# Patient Record
Sex: Male | Born: 1945 | Race: White | Hispanic: No | Marital: Married | Smoking: Never smoker
Health system: Southern US, Community
[De-identification: ages and names within clinical notes are randomized; demographics above are authoritative.]

## PROBLEM LIST (undated history)

## (undated) DIAGNOSIS — F988 Other specified behavioral and emotional disorders with onset usually occurring in childhood and adolescence: Secondary | ICD-10-CM

## (undated) DIAGNOSIS — E7800 Pure hypercholesterolemia, unspecified: Secondary | ICD-10-CM

## (undated) DIAGNOSIS — Z800 Family history of malignant neoplasm of digestive organs: Secondary | ICD-10-CM

## (undated) DIAGNOSIS — R911 Solitary pulmonary nodule: Secondary | ICD-10-CM

## (undated) DIAGNOSIS — D126 Benign neoplasm of colon, unspecified: Secondary | ICD-10-CM

## (undated) DIAGNOSIS — K5730 Diverticulosis of large intestine without perforation or abscess without bleeding: Secondary | ICD-10-CM

## (undated) DIAGNOSIS — K219 Gastro-esophageal reflux disease without esophagitis: Secondary | ICD-10-CM

## (undated) DIAGNOSIS — Z0189 Encounter for other specified special examinations: Secondary | ICD-10-CM

## (undated) DIAGNOSIS — I10 Essential (primary) hypertension: Principal | ICD-10-CM

## (undated) DIAGNOSIS — R7303 Prediabetes: Secondary | ICD-10-CM

## (undated) DIAGNOSIS — E785 Hyperlipidemia, unspecified: Secondary | ICD-10-CM

## (undated) DIAGNOSIS — H9193 Unspecified hearing loss, bilateral: Secondary | ICD-10-CM

## (undated) DIAGNOSIS — R413 Other amnesia: Secondary | ICD-10-CM

## (undated) DIAGNOSIS — H269 Unspecified cataract: Secondary | ICD-10-CM

## (undated) DIAGNOSIS — R109 Unspecified abdominal pain: Secondary | ICD-10-CM

## (undated) DIAGNOSIS — E669 Obesity, unspecified: Secondary | ICD-10-CM

## (undated) DIAGNOSIS — R0789 Other chest pain: Secondary | ICD-10-CM

## (undated) HISTORY — DX: Unspecified hearing loss, bilateral: H91.93

## (undated) HISTORY — DX: Obesity, unspecified: E66.9

## (undated) HISTORY — DX: Other amnesia: R41.3

## (undated) HISTORY — DX: Other specified behavioral and emotional disorders with onset usually occurring in childhood and adolescence: F98.8

## (undated) HISTORY — PX: VASECTOMY: SHX75

## (undated) HISTORY — DX: Prediabetes: R73.03

## (undated) HISTORY — DX: Essential (primary) hypertension: I10

## (undated) HISTORY — DX: Encounter for other specified special examinations: Z01.89

## (undated) HISTORY — DX: Other chest pain: R07.89

## (undated) HISTORY — DX: Family history of malignant neoplasm of digestive organs: Z80.0

## (undated) HISTORY — DX: Pure hypercholesterolemia, unspecified: E78.00

## (undated) HISTORY — DX: Solitary pulmonary nodule: R91.1

## (undated) HISTORY — DX: Gastro-esophageal reflux disease without esophagitis: K21.9

## (undated) HISTORY — DX: Benign neoplasm of colon, unspecified: D12.6

## (undated) HISTORY — DX: Unspecified cataract: H26.9

## (undated) HISTORY — DX: Hyperlipidemia, unspecified: E78.5

## (undated) HISTORY — DX: Diverticulosis of large intestine without perforation or abscess without bleeding: K57.30

## (undated) HISTORY — DX: Unspecified abdominal pain: R10.9

## (undated) HISTORY — PX: CATARACT EXTRACTION W/ INTRAOCULAR LENS  IMPLANT, BILATERAL: SHX1307

---

## 1983-09-03 HISTORY — PX: COCCYGECTOMY: SUR274

## 1998-12-19 ENCOUNTER — Ambulatory Visit (HOSPITAL_COMMUNITY): Admission: RE | Admit: 1998-12-19 | Discharge: 1998-12-19 | Payer: Self-pay | Admitting: Family Medicine

## 1998-12-20 ENCOUNTER — Encounter: Admission: RE | Admit: 1998-12-20 | Discharge: 1998-12-20 | Payer: Self-pay | Admitting: Family Medicine

## 1998-12-31 ENCOUNTER — Encounter: Payer: Self-pay | Admitting: Family Medicine

## 1998-12-31 ENCOUNTER — Ambulatory Visit (HOSPITAL_COMMUNITY): Admission: RE | Admit: 1998-12-31 | Discharge: 1998-12-31 | Payer: Self-pay | Admitting: Family Medicine

## 1999-01-08 ENCOUNTER — Encounter: Admission: RE | Admit: 1999-01-08 | Discharge: 1999-01-08 | Payer: Self-pay | Admitting: Family Medicine

## 1999-01-22 ENCOUNTER — Encounter: Admission: RE | Admit: 1999-01-22 | Discharge: 1999-01-22 | Payer: Self-pay | Admitting: Family Medicine

## 1999-06-08 ENCOUNTER — Encounter: Admission: RE | Admit: 1999-06-08 | Discharge: 1999-06-08 | Payer: Self-pay | Admitting: Family Medicine

## 1999-12-31 ENCOUNTER — Encounter: Admission: RE | Admit: 1999-12-31 | Discharge: 1999-12-31 | Payer: Self-pay | Admitting: Family Medicine

## 1999-12-31 ENCOUNTER — Encounter: Payer: Self-pay | Admitting: Sports Medicine

## 2003-01-27 ENCOUNTER — Encounter: Admission: RE | Admit: 2003-01-27 | Discharge: 2003-01-27 | Payer: Self-pay | Admitting: Family Medicine

## 2003-02-17 ENCOUNTER — Encounter: Admission: RE | Admit: 2003-02-17 | Discharge: 2003-02-17 | Payer: Self-pay | Admitting: Family Medicine

## 2003-04-25 ENCOUNTER — Encounter (INDEPENDENT_AMBULATORY_CARE_PROVIDER_SITE_OTHER): Payer: Self-pay | Admitting: *Deleted

## 2003-04-25 ENCOUNTER — Ambulatory Visit (HOSPITAL_COMMUNITY): Admission: RE | Admit: 2003-04-25 | Discharge: 2003-04-25 | Payer: Self-pay | Admitting: Gastroenterology

## 2004-08-30 ENCOUNTER — Ambulatory Visit (HOSPITAL_COMMUNITY): Admission: RE | Admit: 2004-08-30 | Discharge: 2004-08-30 | Payer: Self-pay | Admitting: Family Medicine

## 2004-08-30 ENCOUNTER — Ambulatory Visit: Payer: Self-pay | Admitting: Family Medicine

## 2004-09-06 ENCOUNTER — Ambulatory Visit (HOSPITAL_COMMUNITY): Admission: RE | Admit: 2004-09-06 | Discharge: 2004-09-06 | Payer: Self-pay | Admitting: Family Medicine

## 2004-09-06 ENCOUNTER — Ambulatory Visit: Payer: Self-pay | Admitting: Family Medicine

## 2004-10-01 ENCOUNTER — Ambulatory Visit (HOSPITAL_COMMUNITY): Admission: RE | Admit: 2004-10-01 | Discharge: 2004-10-01 | Payer: Self-pay | Admitting: Family Medicine

## 2005-07-15 ENCOUNTER — Ambulatory Visit: Payer: Self-pay | Admitting: Family Medicine

## 2006-09-02 DIAGNOSIS — Z0189 Encounter for other specified special examinations: Secondary | ICD-10-CM

## 2006-09-02 HISTORY — DX: Encounter for other specified special examinations: Z01.89

## 2006-10-06 ENCOUNTER — Ambulatory Visit (HOSPITAL_COMMUNITY): Admission: RE | Admit: 2006-10-06 | Discharge: 2006-10-06 | Payer: Self-pay | Admitting: Family Medicine

## 2006-10-06 ENCOUNTER — Ambulatory Visit: Payer: Self-pay | Admitting: Family Medicine

## 2006-10-06 LAB — CONVERTED CEMR LAB
ALT: 21 units/L (ref 0–53)
AST: 18 units/L (ref 0–37)
Albumin: 4.7 g/dL (ref 3.5–5.2)
Alkaline Phosphatase: 43 units/L (ref 39–117)
BUN: 23 mg/dL (ref 6–23)
CO2: 27 meq/L (ref 19–32)
Calcium: 10.1 mg/dL (ref 8.4–10.5)
Chloride: 102 meq/L (ref 96–112)
Cholesterol: 216 mg/dL — ABNORMAL HIGH (ref 0–200)
Creatinine, Ser: 1.15 mg/dL (ref 0.40–1.50)
Glucose, Bld: 104 mg/dL — ABNORMAL HIGH (ref 70–99)
HDL: 50 mg/dL (ref >39)
LDL Cholesterol: 134 mg/dL — ABNORMAL HIGH (ref 0–99)
Potassium: 4.8 meq/L (ref 3.5–5.3)
Sodium: 147 meq/L — ABNORMAL HIGH (ref 135–145)
TSH: 1.538 microintl units/mL (ref 0.350–5.50)
Total Bilirubin: 0.6 mg/dL (ref 0.3–1.2)
Total CHOL/HDL Ratio: 4.3
Total Protein: 7.4 g/dL (ref 6.0–8.3)
Triglycerides: 161 mg/dL — ABNORMAL HIGH (ref <150)
VLDL: 32 mg/dL (ref 0–40)

## 2006-10-20 ENCOUNTER — Ambulatory Visit: Payer: Self-pay | Admitting: Internal Medicine

## 2006-10-20 ENCOUNTER — Ambulatory Visit: Payer: Self-pay

## 2006-10-20 ENCOUNTER — Ambulatory Visit: Payer: Self-pay | Admitting: Cardiovascular Disease

## 2006-10-30 DIAGNOSIS — D126 Benign neoplasm of colon, unspecified: Secondary | ICD-10-CM

## 2006-10-30 DIAGNOSIS — E669 Obesity, unspecified: Secondary | ICD-10-CM

## 2006-10-30 DIAGNOSIS — F988 Other specified behavioral and emotional disorders with onset usually occurring in childhood and adolescence: Secondary | ICD-10-CM | POA: Insufficient documentation

## 2006-10-30 DIAGNOSIS — K219 Gastro-esophageal reflux disease without esophagitis: Secondary | ICD-10-CM

## 2006-10-30 DIAGNOSIS — E7800 Pure hypercholesterolemia, unspecified: Secondary | ICD-10-CM | POA: Insufficient documentation

## 2006-10-30 DIAGNOSIS — E66811 Obesity, class 1: Secondary | ICD-10-CM

## 2006-10-30 DIAGNOSIS — E663 Overweight: Secondary | ICD-10-CM

## 2006-10-30 DIAGNOSIS — E785 Hyperlipidemia, unspecified: Secondary | ICD-10-CM

## 2006-10-30 HISTORY — DX: Other specified behavioral and emotional disorders with onset usually occurring in childhood and adolescence: F98.8

## 2006-10-30 HISTORY — DX: Gastro-esophageal reflux disease without esophagitis: K21.9

## 2006-10-30 HISTORY — DX: Obesity, unspecified: E66.9

## 2006-10-30 HISTORY — DX: Benign neoplasm of colon, unspecified: D12.6

## 2006-10-30 HISTORY — DX: Pure hypercholesterolemia, unspecified: E78.00

## 2006-10-30 HISTORY — DX: Obesity, class 1: E66.811

## 2006-10-30 HISTORY — DX: Hyperlipidemia, unspecified: E78.5

## 2006-11-03 ENCOUNTER — Ambulatory Visit: Payer: Self-pay | Admitting: Psychology

## 2006-11-10 ENCOUNTER — Encounter: Payer: Self-pay | Admitting: Family Medicine

## 2007-01-05 ENCOUNTER — Encounter: Payer: Self-pay | Admitting: Family Medicine

## 2007-04-13 ENCOUNTER — Ambulatory Visit: Payer: Self-pay | Admitting: Cardiology

## 2008-05-02 ENCOUNTER — Ambulatory Visit: Payer: Self-pay | Admitting: Internal Medicine

## 2008-06-16 ENCOUNTER — Encounter: Payer: Self-pay | Admitting: Family Medicine

## 2009-04-19 ENCOUNTER — Telehealth: Payer: Self-pay | Admitting: Family Medicine

## 2009-04-24 ENCOUNTER — Ambulatory Visit: Payer: Self-pay | Admitting: Family Medicine

## 2009-04-24 DIAGNOSIS — I10 Essential (primary) hypertension: Secondary | ICD-10-CM | POA: Insufficient documentation

## 2009-04-24 HISTORY — DX: Essential (primary) hypertension: I10

## 2009-04-24 LAB — CONVERTED CEMR LAB
ALT: 19 units/L (ref 0–53)
AST: 18 units/L (ref 0–37)
Albumin: 4.6 g/dL (ref 3.5–5.2)
Alkaline Phosphatase: 48 units/L (ref 39–117)
BUN: 21 mg/dL (ref 6–23)
CO2: 26 meq/L (ref 19–32)
Calcium: 9.7 mg/dL (ref 8.4–10.5)
Chloride: 104 meq/L (ref 96–112)
Cholesterol: 210 mg/dL — ABNORMAL HIGH (ref 0–200)
Creatinine, Ser: 1.08 mg/dL (ref 0.40–1.50)
Glucose, Bld: 91 mg/dL (ref 70–99)
HCT: 44.7 % (ref 39.0–52.0)
HDL: 68 mg/dL (ref >39)
Hemoglobin: 14.6 g/dL (ref 13.0–17.0)
LDL Cholesterol: 103 mg/dL — ABNORMAL HIGH (ref 0–99)
MCHC: 32.7 g/dL (ref 30.0–36.0)
MCV: 83.6 fL (ref 78.0–100.0)
PSA: 2.52 ng/mL (ref 0.10–4.00)
Platelets: 241 10*3/uL (ref 150–400)
Potassium: 4.8 meq/L (ref 3.5–5.3)
RBC: 5.35 M/uL (ref 4.22–5.81)
RDW: 13.6 % (ref 11.5–15.5)
Sodium: 144 meq/L (ref 135–145)
Total Bilirubin: 0.3 mg/dL (ref 0.3–1.2)
Total CHOL/HDL Ratio: 3.1
Total Protein: 7.2 g/dL (ref 6.0–8.3)
Triglycerides: 195 mg/dL — ABNORMAL HIGH (ref <150)
VLDL: 39 mg/dL (ref 0–40)
WBC: 9.3 10*3/uL (ref 4.0–10.5)

## 2009-04-25 ENCOUNTER — Encounter: Payer: Self-pay | Admitting: Family Medicine

## 2009-05-01 ENCOUNTER — Ambulatory Visit: Payer: Self-pay | Admitting: Family Medicine

## 2009-05-15 ENCOUNTER — Ambulatory Visit: Payer: Self-pay | Admitting: Family Medicine

## 2009-08-03 ENCOUNTER — Ambulatory Visit: Payer: Self-pay | Admitting: Family Medicine

## 2010-10-08 ENCOUNTER — Encounter: Payer: Self-pay | Admitting: *Deleted

## 2011-01-18 NOTE — Op Note (Signed)
NAME:  Christopher Wall, Christopher Wall                         ACCOUNT NO.:  [id number]   MEDICAL RECORD NO.:  [id number]                   PATIENT TYPE:  AMB   LOCATION:  ENDO                                 FACILITY:  [facility]   PHYSICIAN:  Anselmo Rod, M.D.               DATE OF BIRTH:  Feb 28, 1946   DATE OF PROCEDURE:  04/25/2003  DATE OF DISCHARGE:                                 OPERATIVE REPORT   PROCEDURE:  Screening colonoscopy (colonoscopy with cold biopsies x 8).   ENDOSCOPIST:  Charna Elizabeth, M.D.   INSTRUMENT USED:  Olympus video colonoscope.   INDICATIONS FOR PROCEDURE:  A 65 year old white male undergoing screening  colonoscopy to rule out colonic polyps, masses, etc.   PREPROCEDURE PREPARATION:  Informed consent was procured from the patient.  The patient fasted for eight hours prior to the procedure and prepped with a  bottle of magnesium citrate and a gallon of GOLYTELY the night prior to the  procedure.   PREPROCEDURE PHYSICAL EXAMINATION:  VITAL SIGNS:  The patient had stable  vital signs.  NECK:  Supple.  CHEST:  Clear to auscultation.  HEART:  S1 and S2 regular.  ABDOMEN:  Soft with normal bowel sounds.   DESCRIPTION OF PROCEDURE:  The patient was placed in the left lateral  decubitus position, and sedated with 50 mg of Demerol and 5 mg of Versed  intravenously.  Once the patient was adequately sedated and maintained on  low flow oxygen and continuous cardiac monitoring, the Olympus video  colonoscope was advanced from the rectum to the cecum and terminal ileum  without difficulty.  There was some residual stool in the colon.  Multiple  washings were done.  The appendical orifice and ileocecal valve were clearly  visualized.  The terminal ileum appeared healthy and without lesions.  Prominent lymphoid follicles were noted.  Eight small sessile polyps were  biopsied from the rectosigmoid area.  These appeared to be hyperplastic in  nature.  Retroflexion in the rectum  revealed no abnormalities.  The patient  tolerated the procedure well without complications.  There was no evidence  of diverticulosis.   IMPRESSION:  1. Multiple small sessile polyps biopsied from the rectosigmoid area.  2. Otherwise, normal colonoscopy to the terminal ileum.  3. Retroflexion in the rectum revealed no abnormalities.           RECOMMENDATIONS:  1. Await pathology results.  2. Repeat CRC screening depending on pathology results.  3. Avoid all nonsteroidals including aspirin for the next 2 weeks.  4. Outpatient followup as advised in the future.                                               Anselmo Rod, M.D.    JNM/MEDQ  D:  04/25/2003  T:  04/25/2003  Job:  [id number]   cc:   Leighton Roach McDiarmid, M.D.  [address] [place]  [place] [address]  Fax: [phone]

## 2011-01-18 NOTE — Procedures (Signed)
[facility]                              EXERCISE TREADMILL   NAME:Christopher Wall, Christopher Wall                      MRN:          [id number]  DATE:10/20/2006                            DOB:          [birth year]    This patient has finished the equivalent of stage IV of the Bruce  protocol exercise up to fatigue.  His maximum heart rate was 164 beats.  Blood pressure 198/96.  Resting EKG showed normal sinus rhythm.  Essentially normal except for some nonspecific T waves in lead III with  exercise.  There was no significant ischemia.  No chest pain.  No  arrhythmia.   IMPRESSION:  Negative exercise stress test at a good workload.     Noralyn Pick. Eden Emms, MD, [facility]  Electronically Signed    PCN/MedQ  DD: 10/20/2006  DT: 10/20/2006  Job #: [id number]

## 2011-01-18 NOTE — Assessment & Plan Note (Signed)
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 McDiarmid who is referred for a treadmill test.   The patient has had chest pain increasing over the last two months.  The  pain is somewhat atypical.  It is not necessarily exertional.  It can  last most of the day.  He has had a problem with reflux before and takes  antacids at night.  However, he does not feel that food or recumbency  makes his pain worse.   The pain is nonpleuritic.  He is not having any significant symptoms  referable to his respiratory tract.   The patient has had this problem before.  He had a normal treadmill in  2000 and 2006.  His worst episode of chest pain was in December 2005.   He tends to get pain in his shoulders and chest when he gains weight.  He is currently overweight.   REVIEW OF SYSTEMS:  Remarkable for increased stress.  He has an older  brother who has terminal lymphoma.  Otherwise, he denied any significant  PND, orthopnea or lower extremity edema.   He has not had previous reactions to dye.  He is allergic to Ceclor.  He  drinks a glass of wine a day and 2-3 cups of caffeinated beverages a  day.  He has been somewhat sedentary lately, but has begun working out  on his Nordic tract the last month or so.   The patient is a Education officer, community.  He is divorced.  He has three children from a  previous marriage and a step-son who lives here in [place].  He  continues to practice Tuesday through Friday.  He does general  dentistry.   His only previous surgery was a coccyx removal in 1988.   He has significant depression, anxiety and fatigue.   His mother died at age 57 of renal failure.  Father is still living.   He has three  living brothers.  He has had two brothers who have  lymphoma.   PHYSICAL EXAMINATION:  HEENT:  Normal.  Normal lymphadenopathy or  thyromegaly.  LUNGS:  Clear.  NECK:  Carotids were normal.  HEART:  S1, S2, normal heart sounds.  No pain to palpation.  No evidence  of Tietze syndrome.  ABDOMEN:  Benign.  No epigastric pain.  Pulses are intact.  No edema.   STUDIES:  EKG is essentially normal with minor T wave changes in lead 3  and possible left atrial enlargement.   I reviewed his previous records and treadmill test from [facility].   IMPRESSION:  The patient's pain is atypical.  His EKG is fairly normal.  We will try to do a treadmill study on him today.   However, this is his third episode of pain over the years, and there is  a fairly significant history of lymphoma in his brothers.  Suspect he  has had blood work, but checking his CBC and blood counts would be in  order.  I will leave this up to Dr. McDiarmid.  He has not had a chest x-  ray in 6-7 years.  I think it is reasonable to do a CT scan to rule out  lymphoma or other mass in his chest that could be causing this recurring  chest pain.  As long as his treadmill test and CT scan are negative, he  will follow up with Dr. Perley Jain.  The patient did relate that he is  under significant stress lately, particularly because of his brother's  illness.  This and his weight gain may be contributing to his chest  pain.   It is reassuring that his EKG is essentially normal.     Theron Arista C. Eden Emms, MD, [facility]  Electronically Signed    PCN/MedQ  DD: 10/20/2006  DT: 10/20/2006  Job #: [id number]

## 2012-03-16 ENCOUNTER — Encounter: Payer: Self-pay | Admitting: Family Medicine

## 2012-03-16 ENCOUNTER — Ambulatory Visit (INDEPENDENT_AMBULATORY_CARE_PROVIDER_SITE_OTHER): Payer: Medicare Other | Admitting: Family Medicine

## 2012-03-16 ENCOUNTER — Ambulatory Visit (HOSPITAL_COMMUNITY)
Admission: RE | Admit: 2012-03-16 | Discharge: 2012-03-16 | Disposition: A | Discharge status: Home / Self Care | Payer: Medicare Other | Source: Ambulatory Visit | Attending: Family Medicine | Admitting: Family Medicine

## 2012-03-16 VITALS — BP 184/98 | HR 73 | Temp 98.2°F | Ht 69.25 in | Wt 200.0 lb

## 2012-03-16 DIAGNOSIS — I10 Essential (primary) hypertension: Secondary | ICD-10-CM | POA: Insufficient documentation

## 2012-03-16 DIAGNOSIS — Z125 Encounter for screening for malignant neoplasm of prostate: Secondary | ICD-10-CM

## 2012-03-16 DIAGNOSIS — R030 Elevated blood-pressure reading, without diagnosis of hypertension: Secondary | ICD-10-CM

## 2012-03-16 DIAGNOSIS — K219 Gastro-esophageal reflux disease without esophagitis: Secondary | ICD-10-CM

## 2012-03-16 DIAGNOSIS — E785 Hyperlipidemia, unspecified: Secondary | ICD-10-CM

## 2012-03-16 NOTE — Patient Instructions (Addendum)
Please return for fasting lab work on 03/17/2012.  Do not begin your exercise routine until you have been notified of your lab results.  I will call you if your tests are not good.  Otherwise I will send you a letter.  If you do not hear from me with in 2 weeks please call our office.    When you begin your exercise routine, start slow and gradually increase your performance taking care to note any chest pain or shortness of breath.  Please follow up with our health educator regarding diet/exercise reccomendations that can be made to assist you in reaching your weight/fitness goals.

## 2012-03-16 NOTE — Assessment & Plan Note (Signed)
Stable without red flags.  Continue as needed ranitidine

## 2012-03-16 NOTE — Progress Notes (Signed)
  Subjective:    Patient ID: Christopher Wall, male    DOB: Dec 05, 1945, 66 y.o.   MRN: [id number]  HPI   Hypertension Here to discuss high blood pressures.  In the past has had elevated pressures whenever he gained weight that decreased as he lost it.  Has been harder to lose weight recently.  No chest pain or shortness of breath on exertion but does feel his exercise tolerance is less than before.  Has not been exercising and would like to start.    Reflux Take ranitidine as needed.  No problems swallowing or any bleeding or weight loss   Hyperlipidemia Never on medications.  Concerned maybe worse with weight gain.  No fhx of coronary artery disease.  Review of Symptoms - see HPI  PMH - Smoking status noted.      Review of Systems     Objective:   Physical Exam Heart - Regular rate and rhythm.  No murmurs, gallops or rubs.    Lungs:  Normal respiratory effort, chest expands symmetrically. Lungs are clear to auscultation, no crackles or wheezes. Extremities:  No cyanosis, edema, or deformity noted with good range of motion of all major joints.   Neck:  No deformities, thyromegaly, masses, or tenderness noted.   Supple with full range of motion without pain. Skin:  Intact without suspicious lesions or rashes Abdomen: soft and non-tender without masses, organomegaly or hernias noted.  No guarding or rebound       Assessment & Plan:

## 2012-03-16 NOTE — Assessment & Plan Note (Signed)
Will check labs

## 2012-03-16 NOTE — Assessment & Plan Note (Signed)
Worsened.  Will check labs.  His ecg shows questionable atrial enlargement other wise normal.   Will likely need medication unless able to make major weight changes.

## 2012-03-17 ENCOUNTER — Other Ambulatory Visit (INDEPENDENT_AMBULATORY_CARE_PROVIDER_SITE_OTHER): Payer: Medicare Other

## 2012-03-17 DIAGNOSIS — E785 Hyperlipidemia, unspecified: Secondary | ICD-10-CM

## 2012-03-17 DIAGNOSIS — Z125 Encounter for screening for malignant neoplasm of prostate: Secondary | ICD-10-CM

## 2012-03-17 DIAGNOSIS — R030 Elevated blood-pressure reading, without diagnosis of hypertension: Secondary | ICD-10-CM

## 2012-03-17 LAB — LIPID PANEL
LDL Cholesterol: 131 mg/dL — ABNORMAL HIGH (ref 0–99)
VLDL: 31 mg/dL (ref 0–40)

## 2012-03-17 LAB — POCT URINALYSIS DIPSTICK
Glucose, UA: NEGATIVE
Nitrite, UA: NEGATIVE
Urobilinogen, UA: 0.2

## 2012-03-17 LAB — COMPREHENSIVE METABOLIC PANEL
AST: 20 U/L (ref 0–37)
Albumin: 4.3 g/dL (ref 3.5–5.2)
Alkaline Phosphatase: 41 U/L (ref 39–117)
Potassium: 4.9 mEq/L (ref 3.5–5.3)
Sodium: 142 mEq/L (ref 135–145)
Total Bilirubin: 0.5 mg/dL (ref 0.3–1.2)
Total Protein: 7 g/dL (ref 6.0–8.3)

## 2012-03-17 LAB — PSA: PSA: 1.61 ng/mL (ref <=4.00)

## 2012-03-17 NOTE — Progress Notes (Unsigned)
CMP,FLP,PSA AND UA DONE TODAY Christopher Wall 

## 2012-03-19 ENCOUNTER — Encounter: Payer: Self-pay | Admitting: Family Medicine

## 2012-09-30 ENCOUNTER — Ambulatory Visit
Admission: RE | Admit: 2012-09-30 | Discharge: 2012-09-30 | Disposition: A | Discharge status: Home / Self Care | Payer: Medicare Other | Source: Ambulatory Visit | Attending: Family Medicine | Admitting: Family Medicine

## 2012-09-30 ENCOUNTER — Ambulatory Visit (HOSPITAL_COMMUNITY)
Admission: RE | Admit: 2012-09-30 | Discharge: 2012-09-30 | Disposition: A | Discharge status: Home / Self Care | Payer: Medicare Other | Source: Ambulatory Visit | Attending: Family Medicine | Admitting: Family Medicine

## 2012-09-30 ENCOUNTER — Encounter: Payer: Self-pay | Admitting: Family Medicine

## 2012-09-30 ENCOUNTER — Ambulatory Visit (INDEPENDENT_AMBULATORY_CARE_PROVIDER_SITE_OTHER): Payer: Medicare Other | Admitting: Family Medicine

## 2012-09-30 VITALS — BP 193/98 | HR 82 | Temp 97.9°F | Ht 69.0 in | Wt 203.1 lb

## 2012-09-30 DIAGNOSIS — R059 Cough, unspecified: Secondary | ICD-10-CM

## 2012-09-30 DIAGNOSIS — R05 Cough: Secondary | ICD-10-CM

## 2012-09-30 DIAGNOSIS — R030 Elevated blood-pressure reading, without diagnosis of hypertension: Secondary | ICD-10-CM

## 2012-09-30 DIAGNOSIS — I10 Essential (primary) hypertension: Secondary | ICD-10-CM | POA: Insufficient documentation

## 2012-09-30 DIAGNOSIS — R079 Chest pain, unspecified: Secondary | ICD-10-CM

## 2012-09-30 DIAGNOSIS — E785 Hyperlipidemia, unspecified: Secondary | ICD-10-CM

## 2012-09-30 DIAGNOSIS — R053 Chronic cough: Secondary | ICD-10-CM | POA: Insufficient documentation

## 2012-09-30 DIAGNOSIS — Z23 Encounter for immunization: Secondary | ICD-10-CM

## 2012-09-30 MED ORDER — ASPIRIN 325 MG PO TABS: 325.0000 mg | ORAL_TABLET | Freq: Every day | ORAL | Status: DC

## 2012-09-30 MED ORDER — LISINOPRIL-HYDROCHLOROTHIAZIDE 10-12.5 MG PO TABS: 1.0000 | ORAL_TABLET | Freq: Every day | ORAL | Status: DC

## 2012-09-30 NOTE — Progress Notes (Signed)
  Subjective:    Patient ID: Christopher Wall, male    DOB: 25-Sep-1945, 67 y.o.   MRN: [id number]  HPI  Patient with longstanding borderline hypertension.  Over last 2 months has run consistently high.  Also had Respiratory infection last Thanksgiving and still has lingering cough.  Did have chest discomfort with cough and the chest discomfort has lingered despite improvement of cough.  Pain is dull substernal, consistent - perhaps worse with exercise.    Review of Systems     Objective:   Physical Exam Neck supple without masses Lungs clear Cardiac RRR without m or g Abd benign       Assessment & Plan:

## 2012-09-30 NOTE — Patient Instructions (Addendum)
Please pick up the new blood pressure prescription at [facility] Also begin taking one aspirin daily. See me in one week to recheck blood pressure and kidney function.  Also, you should discuss starting a statin.  Work on becoming more fit.  Decreasing salt/socium intake is the quickest thing to lower your blood pressure You are almost certainly due for a colonoscopy. Today you received a pneumovax, which is only given one time after age 73

## 2012-10-01 NOTE — Assessment & Plan Note (Signed)
Once I better understand cardiac risk, I will make a recommendation about statin.

## 2012-10-01 NOTE — Assessment & Plan Note (Signed)
Mildly concerning for cardiac origin - but the fact that he has had constant pain, ACS is highly unlikely.

## 2012-10-01 NOTE — Assessment & Plan Note (Signed)
With chest pain, will treat as hypertensive urgency.  Will treat agressively with two drugs and schedule short term follow up.

## 2012-10-14 ENCOUNTER — Ambulatory Visit: Payer: Medicare Other | Admitting: Family Medicine

## 2012-10-21 ENCOUNTER — Ambulatory Visit (INDEPENDENT_AMBULATORY_CARE_PROVIDER_SITE_OTHER): Payer: Medicare Other | Admitting: Family Medicine

## 2012-10-21 VITALS — BP 138/90 | HR 86 | Temp 99.0°F | Ht 69.0 in | Wt 195.0 lb

## 2012-10-21 DIAGNOSIS — I10 Essential (primary) hypertension: Secondary | ICD-10-CM

## 2012-10-21 DIAGNOSIS — K219 Gastro-esophageal reflux disease without esophagitis: Secondary | ICD-10-CM

## 2012-10-21 DIAGNOSIS — D126 Benign neoplasm of colon, unspecified: Secondary | ICD-10-CM

## 2012-10-21 DIAGNOSIS — E669 Obesity, unspecified: Secondary | ICD-10-CM

## 2012-10-21 LAB — BASIC METABOLIC PANEL
BUN: 19 mg/dL (ref 6–23)
CO2: 28 mEq/L (ref 19–32)
Chloride: 102 mEq/L (ref 96–112)
Glucose, Bld: 82 mg/dL (ref 70–99)
Potassium: 4.9 mEq/L (ref 3.5–5.3)

## 2012-10-21 MED ORDER — LISINOPRIL-HYDROCHLOROTHIAZIDE 10-12.5 MG PO TABS: 1.0000 | ORAL_TABLET | Freq: Every day | ORAL | Status: DC

## 2012-10-21 NOTE — Patient Instructions (Addendum)
You can get a full cholesterol panel any time after 03/17/13.  Your cholesterol was borderline high when you weighed 200 lbs.  It was great when you weighed 176

## 2012-10-22 ENCOUNTER — Encounter: Payer: Self-pay | Admitting: Family Medicine

## 2012-10-22 NOTE — Progress Notes (Signed)
Patient ID: Christopher Wall, male   DOB: Feb 10, 1946, 67 y.o.   MRN: [id number] Doing great.  Tolerating BP meds well.  Actually feels better on meds.  Denies any chest pain.  Feels his previous chest pain may have been related to his GERD.  Has altered diet (note great wt loss) and taking H2 blocker regularly.  Lungs clear Cardiac RRR without m or g

## 2012-10-22 NOTE — Assessment & Plan Note (Signed)
Well controled on current meds 

## 2012-10-22 NOTE — Assessment & Plan Note (Signed)
Well controled with diet and meds

## 2012-10-22 NOTE — Assessment & Plan Note (Signed)
Great wt loss.

## 2013-12-20 ENCOUNTER — Ambulatory Visit (INDEPENDENT_AMBULATORY_CARE_PROVIDER_SITE_OTHER): Payer: Medicare Other | Admitting: Family Medicine

## 2013-12-20 ENCOUNTER — Encounter: Payer: Self-pay | Admitting: Family Medicine

## 2013-12-20 VITALS — BP 125/76 | HR 82 | Temp 98.4°F | Ht 69.0 in | Wt 200.0 lb

## 2013-12-20 DIAGNOSIS — E785 Hyperlipidemia, unspecified: Secondary | ICD-10-CM

## 2013-12-20 DIAGNOSIS — R7309 Other abnormal glucose: Secondary | ICD-10-CM

## 2013-12-20 DIAGNOSIS — H9190 Unspecified hearing loss, unspecified ear: Secondary | ICD-10-CM

## 2013-12-20 DIAGNOSIS — R7303 Prediabetes: Secondary | ICD-10-CM

## 2013-12-20 DIAGNOSIS — H9193 Unspecified hearing loss, bilateral: Secondary | ICD-10-CM

## 2013-12-20 DIAGNOSIS — I10 Essential (primary) hypertension: Secondary | ICD-10-CM

## 2013-12-20 DIAGNOSIS — E663 Overweight: Secondary | ICD-10-CM

## 2013-12-20 DIAGNOSIS — Z23 Encounter for immunization: Secondary | ICD-10-CM

## 2013-12-20 DIAGNOSIS — D126 Benign neoplasm of colon, unspecified: Secondary | ICD-10-CM

## 2013-12-20 LAB — POCT GLYCOSYLATED HEMOGLOBIN (HGB A1C): HEMOGLOBIN A1C: 5.9

## 2013-12-20 MED ORDER — LISINOPRIL-HYDROCHLOROTHIAZIDE 10-12.5 MG PO TABS: 1.0000 | ORAL_TABLET | Freq: Every day | ORAL | Status: DC

## 2013-12-20 NOTE — Patient Instructions (Addendum)
Start Baby Aspirin daily Checking Lipids, BMET, A1C, AND cbc TODAY.  Sign up with My Chart to see your Results    Hypertension As your heart beats, it forces blood through your arteries. This force is your blood pressure. If the pressure is too high, it is called hypertension (HTN) or high blood pressure. HTN is dangerous because you may have it and not know it. High blood pressure may mean that your heart has to work harder to pump blood. Your arteries may be narrow or stiff. The extra work puts you at risk for heart disease, stroke, and other problems.  Blood pressure consists of two numbers, a higher number over a lower, 110/72, for example. It is stated as "110 over 72." The ideal is below 120 for the top number (systolic) and under 80 for the bottom (diastolic). Write down your blood pressure today. You should pay close attention to your blood pressure if you have certain conditions such as:  Heart failure.  Prior heart attack.  Diabetes  Chronic kidney disease.  Prior stroke.  Multiple risk factors for heart disease. To see if you have HTN, your blood pressure should be measured while you are seated with your arm held at the level of the heart. It should be measured at least twice. A one-time elevated blood pressure reading (especially in the Emergency Department) does not mean that you need treatment. There may be conditions in which the blood pressure is different between your right and left arms. It is important to see your caregiver soon for a recheck. Most people have essential hypertension which means that there is not a specific cause. This type of high blood pressure may be lowered by changing lifestyle factors such as:  Stress.  Smoking.  Lack of exercise.  Excessive weight.  Drug/tobacco/alcohol use.  Eating less salt. Most people do not have symptoms from high blood pressure until it has caused damage to the body. Effective treatment can often prevent, delay or  reduce that damage. TREATMENT  When a cause has been identified, treatment for high blood pressure is directed at the cause. There are a large number of medications to treat HTN. These fall into several categories, and your caregiver will help you select the medicines that are best for you. Medications may have side effects. You should review side effects with your caregiver. If your blood pressure stays high after you have made lifestyle changes or started on medicines,   Your medication(s) may need to be changed.  Other problems may need to be addressed.  Be certain you understand your prescriptions, and know how and when to take your medicine.  Be sure to follow up with your caregiver within the time frame advised (usually within two weeks) to have your blood pressure rechecked and to review your medications.  If you are taking more than one medicine to lower your blood pressure, make sure you know how and at what times they should be taken. Taking two medicines at the same time can result in blood pressure that is too low. SEEK IMMEDIATE MEDICAL CARE IF:  You develop a severe headache, blurred or changing vision, or confusion.  You have unusual weakness or numbness, or a faint feeling.  You have severe chest or abdominal pain, vomiting, or breathing problems. MAKE SURE YOU:   Understand these instructions.  Will watch your condition.  Will get help right away if you are not doing well or get worse. Document Released: 08/19/2005 Document Revised: 11/11/2011 Document  Reviewed: 04/08/2008 ExitCare Patient Information 2014 [place], [place].

## 2013-12-21 ENCOUNTER — Encounter: Payer: Self-pay | Admitting: Family Medicine

## 2013-12-21 DIAGNOSIS — H9193 Unspecified hearing loss, bilateral: Secondary | ICD-10-CM

## 2013-12-21 DIAGNOSIS — R7303 Prediabetes: Secondary | ICD-10-CM

## 2013-12-21 DIAGNOSIS — Z9189 Other specified personal risk factors, not elsewhere classified: Secondary | ICD-10-CM | POA: Insufficient documentation

## 2013-12-21 HISTORY — DX: Prediabetes: R73.03

## 2013-12-21 HISTORY — DX: Unspecified hearing loss, bilateral: H91.93

## 2013-12-21 LAB — BASIC METABOLIC PANEL
BUN: 23 mg/dL (ref 6–23)
CHLORIDE: 103 meq/L (ref 96–112)
CO2: 27 mEq/L (ref 19–32)
CREATININE: 1.27 mg/dL (ref 0.50–1.35)
Calcium: 9.5 mg/dL (ref 8.4–10.5)
Glucose, Bld: 99 mg/dL (ref 70–99)
Potassium: 3.9 mEq/L (ref 3.5–5.3)
Sodium: 143 mEq/L (ref 135–145)

## 2013-12-21 LAB — CBC WITH DIFFERENTIAL/PLATELET
BASOS ABS: 0 10*3/uL (ref 0.0–0.1)
Basophils Relative: 0 % (ref 0–1)
EOS PCT: 0 % (ref 0–5)
Eosinophils Absolute: 0 10*3/uL (ref 0.0–0.7)
HCT: 42.4 % (ref 39.0–52.0)
Hemoglobin: 14.6 g/dL (ref 13.0–17.0)
LYMPHS ABS: 2.6 10*3/uL (ref 0.7–4.0)
LYMPHS PCT: 36 % (ref 12–46)
MCH: 27.8 pg (ref 26.0–34.0)
MCHC: 34.4 g/dL (ref 30.0–36.0)
MCV: 80.8 fL (ref 78.0–100.0)
Monocytes Absolute: 0.5 10*3/uL (ref 0.1–1.0)
Monocytes Relative: 7 % (ref 3–12)
NEUTROS ABS: 4.1 10*3/uL (ref 1.7–7.7)
NEUTROS PCT: 57 % (ref 43–77)
PLATELETS: 225 10*3/uL (ref 150–400)
RBC: 5.25 MIL/uL (ref 4.22–5.81)
RDW: 14.7 % (ref 11.5–15.5)
WBC: 7.2 10*3/uL (ref 4.0–10.5)

## 2013-12-21 LAB — LIPID PANEL
CHOL/HDL RATIO: 4.4 ratio
Cholesterol: 221 mg/dL — ABNORMAL HIGH (ref 0–200)
HDL: 50 mg/dL (ref >39)
LDL CALC: 113 mg/dL — AB (ref 0–99)
TRIGLYCERIDES: 290 mg/dL — AB (ref <150)
VLDL: 58 mg/dL — AB (ref 0–40)

## 2013-12-21 NOTE — Assessment & Plan Note (Addendum)
ACC calculation 10-yr risk of CV event of 9.5% with recommendation to consider Statin therapy of moderate-to-high potency (12/21/13).  LDL 113, TC 221   HDL 50   After discussion with Dr Lovena Le, he would like to work on lifestyle (diet and exercise) then retesting of Lipids before he would want to consider Statin therapy.

## 2013-12-21 NOTE — Progress Notes (Signed)
   Subjective:    Patient ID: Christopher Wall, male    DOB: October 18, 1945, 68 y.o.   MRN: [id number]  Hypertension Associated symptoms include neck pain. Pertinent negatives include no shortness of breath.   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   Medications: Compliance- Lisinopril/HCTZ daily Lightheadedness- no   Edema- no  Exercise: no formal exercise Hobby: Keeps horses.   Family History of DIABETES in his father, brother and sister.   Polyuria- no New Visual problems- no   ROS See HPI above   PMH Smoking Status noted   Geriatric Function Screen  B. Hearing:  - Do you have problems with your hearing? Has hearing loss.  Not interfering with life C. Vision:  - Trouble driving, watching TV, reading or daily activities because of eye sight? no  D. Home environment:  - Fall in past Year? no E. ADL/iADL: Independent in iADLs and ADLs  F. Social Support:  - Sport and exercise psychologist in case of illness or emergency: Wife, Christopher Wall. Nutrition:  - Loss of >= 10 lbs over past 6 months: no - BMI < 22%? no  H. Urinary Incontinence:  - Ever lose urine and get wet? no  I. Depression:  - Feel sad or depressed? no  Review of Systems  Respiratory: Negative for shortness of breath.   Cardiovascular: Negative for leg swelling.  Gastrointestinal: Negative for blood in stool.  Genitourinary: Negative for difficulty urinating.  Musculoskeletal: Positive for neck pain.  Skin: Negative for rash.       Objective:   Physical Exam  Constitutional: He appears well-developed and well-nourished. No distress.  HENT:  Head: Normocephalic.  Right Ear: Tympanic membrane, external ear and ear canal normal.  Left Ear: Tympanic membrane, external ear and ear canal normal.  Eyes: Conjunctivae are normal.  Neck: Normal range of motion. Neck supple. No JVD present. Carotid bruit is not present. No  mass and no thyromegaly present.  Cardiovascular: Normal rate, regular rhythm and normal heart sounds.   Pulses:      Dorsalis pedis pulses are 2+ on the right side, and 2+ on the left side.  Pulmonary/Chest: Effort normal and breath sounds normal.  Abdominal: Soft. Normal appearance and bowel sounds are normal. There is no hepatosplenomegaly. There is no tenderness. Hernia confirmed negative in the ventral area.  Lymphadenopathy:    He has no cervical adenopathy.       Right: No inguinal and no supraclavicular adenopathy present.       Left: No inguinal and no supraclavicular adenopathy present.  Neurological: He is alert. Coordination and gait normal.  Psychiatric: He has a normal mood and affect. His speech is normal and behavior is normal. Thought content normal.          Assessment & Plan:

## 2013-12-21 NOTE — Assessment & Plan Note (Signed)
Adequate blood pressure control.  No evidence of new end organ damage.  Tolerating medication without significant adverse effects.  Plan to continue current blood pressure regiment Lisinopril/HCTZ 10/12.5 with Goal of BP < 670/14/ Basic Metabolic Panel:    Component Value Date/Time   NA 143 12/20/2013 1619   K 3.9 12/20/2013 1619   CL 103 12/20/2013 1619   CO2 27 12/20/2013 1619   BUN 23 12/20/2013 1619   CREATININE 1.27 12/20/2013 1619   CREATININE 1.08 04/24/2009 2116   GLUCOSE 99 12/20/2013 1619   CALCIUM 9.5 12/20/2013 1619

## 2013-12-21 NOTE — Assessment & Plan Note (Signed)
Lab Results  Component Value Date   HGBA1C 5.9 12/20/2013  New diagnosis No evidence of end organ damage.  Recommendation: 7% weight loss, approximately 15 pounds, and 150 minutes of moderate exercise per week.

## 2013-12-24 ENCOUNTER — Encounter: Payer: Self-pay | Admitting: Gastroenterology

## 2014-01-10 ENCOUNTER — Telehealth: Payer: Self-pay | Admitting: *Deleted

## 2014-01-10 NOTE — Telephone Encounter (Signed)
Called and left message, pt has 9am pre-visit appt that was missed, pt will need to call back to reschedule appt by 5pm today or we will need to cancel colonoscopy appt, and reschedule both dates-adm

## 2014-01-11 ENCOUNTER — Ambulatory Visit (AMBULATORY_SURGERY_CENTER): Payer: Self-pay

## 2014-01-11 VITALS — Ht 69.0 in | Wt 200.0 lb

## 2014-01-11 DIAGNOSIS — Z860100 Personal history of colon polyps, unspecified: Secondary | ICD-10-CM

## 2014-01-11 NOTE — Progress Notes (Signed)
No allergies to eggs or soy No past problems with anesthesia No diet/weight loss meds No home oxygen  Has email  Emmi instructions given for colonoscopy 

## 2014-02-07 ENCOUNTER — Encounter: Payer: Self-pay | Admitting: Gastroenterology

## 2014-02-07 ENCOUNTER — Ambulatory Visit (AMBULATORY_SURGERY_CENTER): Payer: Medicare Other | Admitting: Gastroenterology

## 2014-02-07 VITALS — BP 120/73 | HR 65 | Temp 93.3°F | Resp 8 | Ht 69.0 in | Wt 200.0 lb

## 2014-02-07 DIAGNOSIS — D126 Benign neoplasm of colon, unspecified: Secondary | ICD-10-CM

## 2014-02-07 DIAGNOSIS — Z860100 Personal history of colon polyps, unspecified: Secondary | ICD-10-CM

## 2014-02-07 DIAGNOSIS — Z1211 Encounter for screening for malignant neoplasm of colon: Secondary | ICD-10-CM

## 2014-02-07 MED ORDER — SODIUM CHLORIDE 0.9 % IV SOLN: 500.0000 mL | INTRAVENOUS | Status: DC

## 2014-02-07 NOTE — Progress Notes (Signed)
A/ox3 pleased with MAC, report to Jane RN 

## 2014-02-07 NOTE — Patient Instructions (Signed)
YOU HAD AN ENDOSCOPIC PROCEDURE TODAY AT THE [facility]: Refer to the procedure report that was given to you for any specific questions about what was found during the examination.  If the procedure report does not answer your questions, please call your gastroenterologist to clarify.  If you requested that your care partner not be given the details of your procedure findings, then the procedure report has been included in a sealed envelope for you to review at your convenience later.  YOU SHOULD EXPECT: Some feelings of bloating in the abdomen. Passage of more gas than usual.  Walking can help get rid of the air that was put into your GI tract during the procedure and reduce the bloating. If you had a lower endoscopy (such as a colonoscopy or flexible sigmoidoscopy) you may notice spotting of blood in your stool or on the toilet paper. If you underwent a bowel prep for your procedure, then you may not have a normal bowel movement for a few days.  DIET: Your first meal following the procedure should be a light meal and then it is ok to progress to your normal diet.  A half-sandwich or bowl of soup is an example of a good first meal.  Heavy or fried foods are harder to digest and may make you feel nauseous or bloated.  Likewise meals heavy in dairy and vegetables can cause extra gas to form and this can also increase the bloating.  Drink plenty of fluids but you should avoid alcoholic beverages for 24 hours.  ACTIVITY: Your care partner should take you home directly after the procedure.  You should plan to take it easy, moving slowly for the rest of the day.  You can resume normal activity the day after the procedure however you should NOT DRIVE or use heavy machinery for 24 hours (because of the sedation medicines used during the test).    SYMPTOMS TO REPORT IMMEDIATELY: A gastroenterologist can be reached at any hour.  During normal business hours, 8:30 AM to 5:00 PM Monday through Friday,  call [phone].  After hours and on weekends, please call the GI answering service at [phone] who will take a message and have the physician on call contact you.   Following lower endoscopy (colonoscopy or flexible sigmoidoscopy):  Excessive amounts of blood in the stool  Significant tenderness or worsening of abdominal pains  Swelling of the abdomen that is new, acute  Fever of 100F or higher FOLLOW UP: If any biopsies were taken you will be contacted by phone or by letter within the next 1-3 weeks.  Call your gastroenterologist if you have not heard about the biopsies in 3 weeks.  Our staff will call the home number listed on your records the next business day following your procedure to check on you and address any questions or concerns that you may have at that time regarding the information given to you following your procedure. This is a courtesy call and so if there is no answer at the home number and we have not heard from you through the emergency physician on call, we will assume that you have returned to your regular daily activities without incident.  SIGNATURES/CONFIDENTIALITY: You and/or your care partner have signed paperwork which will be entered into your electronic medical record.  These signatures attest to the fact that that the information above on your After Visit Summary has been reviewed and is understood.  Full responsibility of the confidentiality of this discharge   information lies with you and/or your care-partner.  Polyp information given. 

## 2014-02-07 NOTE — Progress Notes (Signed)
Called to room to assist during endoscopic procedure.  Patient ID and intended procedure confirmed with present staff. Received instructions for my participation in the procedure from the performing physician.  

## 2014-02-07 NOTE — Op Note (Signed)
[facility]  [address]. [place], [id number]   COLONOSCOPY PROCEDURE REPORT  PATIENT: Christopher Wall, Christopher Wall  MR#: [id number] BIRTHDATE: 1946/07/13 , 32  yrs. old GENDER: Male ENDOSCOPIST: Milus Banister, MD REFERRED TY:OMAY McDiarmid, M.D. PROCEDURE DATE:  02/07/2014 PROCEDURE:   Colonoscopy with snare polypectomy First Screening Colonoscopy - Avg.  risk and is 50 yrs.  old or older - No.  Prior Negative Screening - Now for repeat screening. 10 or more years since last screening  History of Adenoma - Now for follow-up colonoscopy & has been > or = to 3 yrs.  N/A  Polyps Removed Today? Yes. ASA CLASS:   Class II INDICATIONS:average risk screening, colonoscopy Dr. Collene Mares 2004 with hyperplastic polyps removed. MEDICATIONS: MAC sedation, administered by CRNA and propofol (Diprivan) 200mg  IV  DESCRIPTION OF PROCEDURE:   After the risks benefits and alternatives of the procedure were thoroughly explained, informed consent was obtained.  A digital rectal exam revealed no abnormalities of the rectum.   The [id number]  endoscope was introduced through the anus and advanced to the cecum, which was identified by both the appendix and ileocecal valve. No adverse events experienced.   The quality of the prep was excellent.  The instrument was then slowly withdrawn as the colon was fully examined.   COLON FINDINGS: One polyp was found, removed and sent to pathology. This was sessile, 73mm across, located in transverse segment, removed with cold snare.  The examination was otherwise normal. Retroflexed views revealed no abnormalities. The time to cecum=2 minutes 15 seconds.  Withdrawal time=10 minutes 19 seconds.  The scope was withdrawn and the procedure completed. COMPLICATIONS: There were no complications.  ENDOSCOPIC IMPRESSION: One polyp was found, removed and sent to pathology. The examination was otherwise normal.  RECOMMENDATIONS: If the polyp(s) removed  today are proven to be adenomatous (pre-cancerous) polyps, you will need a repeat colonoscopy in 5 years.  Otherwise you should continue to follow colorectal cancer screening guidelines for "routine risk" patients with colonoscopy in 10 years.  You will receive a letter within 1-2 weeks with the results of your biopsy as well as final recommendations.  Please call my office if you have not received a letter after 3 weeks.   eSigned:  Milus Banister, MD 02/07/2014 8:48 AM

## 2014-02-08 ENCOUNTER — Telehealth: Payer: Self-pay

## 2014-02-08 NOTE — Telephone Encounter (Signed)
Left message on answering machine. 

## 2014-02-14 ENCOUNTER — Encounter: Payer: Self-pay | Admitting: Gastroenterology

## 2014-02-16 ENCOUNTER — Emergency Department (HOSPITAL_COMMUNITY)
Admission: EM | Admit: 2014-02-16 | Discharge: 2014-02-16 | Discharge status: Against Medical Advice | Payer: Medicare Other | Attending: Emergency Medicine | Admitting: Emergency Medicine

## 2014-02-16 ENCOUNTER — Encounter (HOSPITAL_COMMUNITY): Payer: Self-pay | Admitting: Emergency Medicine

## 2014-02-16 ENCOUNTER — Emergency Department (HOSPITAL_COMMUNITY)
Admission: EM | Admit: 2014-02-16 | Discharge: 2014-02-16 | Disposition: A | Discharge status: Nursing Home (Includes ICF) | Payer: Medicare Other | Source: Home / Self Care | Attending: Family Medicine | Admitting: Family Medicine

## 2014-02-16 ENCOUNTER — Telehealth (HOSPITAL_COMMUNITY): Payer: Self-pay | Admitting: *Deleted

## 2014-02-16 DIAGNOSIS — A770 Spotted fever due to Rickettsia rickettsii: Secondary | ICD-10-CM

## 2014-02-16 DIAGNOSIS — I10 Essential (primary) hypertension: Secondary | ICD-10-CM | POA: Insufficient documentation

## 2014-02-16 DIAGNOSIS — R509 Fever, unspecified: Secondary | ICD-10-CM | POA: Insufficient documentation

## 2014-02-16 DIAGNOSIS — A779 Spotted fever, unspecified: Secondary | ICD-10-CM

## 2014-02-16 DIAGNOSIS — R52 Pain, unspecified: Secondary | ICD-10-CM | POA: Insufficient documentation

## 2014-02-16 LAB — COMPREHENSIVE METABOLIC PANEL
ALBUMIN: 4.1 g/dL (ref 3.5–5.2)
ALBUMIN: 4.3 g/dL (ref 3.5–5.2)
ALK PHOS: 40 U/L (ref 39–117)
ALT: 27 U/L (ref 0–53)
ALT: 27 U/L (ref 0–53)
AST: 26 U/L (ref 0–37)
AST: 28 U/L (ref 0–37)
Alkaline Phosphatase: 41 U/L (ref 39–117)
BILIRUBIN TOTAL: 0.3 mg/dL (ref 0.3–1.2)
BUN: 18 mg/dL (ref 6–23)
BUN: 18 mg/dL (ref 6–23)
CHLORIDE: 96 meq/L (ref 96–112)
CO2: 24 mEq/L (ref 19–32)
CO2: 25 mEq/L (ref 19–32)
Calcium: 9.3 mg/dL (ref 8.4–10.5)
Calcium: 9.4 mg/dL (ref 8.4–10.5)
Chloride: 95 mEq/L — ABNORMAL LOW (ref 96–112)
Creatinine, Ser: 1.04 mg/dL (ref 0.50–1.35)
Creatinine, Ser: 1.14 mg/dL (ref 0.50–1.35)
GFR calc Af Amer: 84 mL/min — ABNORMAL LOW (ref >90)
GFR calc non Af Amer: 72 mL/min — ABNORMAL LOW (ref >90)
GFR calc non Af Amer: 65 mL/min — ABNORMAL LOW (ref >90)
GFR, EST AFRICAN AMERICAN: 75 mL/min — AB (ref >90)
GLUCOSE: 110 mg/dL — AB (ref 70–99)
Glucose, Bld: 105 mg/dL — ABNORMAL HIGH (ref 70–99)
POTASSIUM: 4 meq/L (ref 3.7–5.3)
Potassium: 3.7 mEq/L (ref 3.7–5.3)
SODIUM: 136 meq/L — AB (ref 137–147)
Sodium: 137 mEq/L (ref 137–147)
TOTAL PROTEIN: 7.8 g/dL (ref 6.0–8.3)
Total Bilirubin: 0.4 mg/dL (ref 0.3–1.2)
Total Protein: 7.5 g/dL (ref 6.0–8.3)

## 2014-02-16 LAB — CBC WITH DIFFERENTIAL/PLATELET
BASOS ABS: 0 10*3/uL (ref 0.0–0.1)
BASOS ABS: 0 10*3/uL (ref 0.0–0.1)
BASOS PCT: 0 % (ref 0–1)
BASOS PCT: 0 % (ref 0–1)
EOS ABS: 0 10*3/uL (ref 0.0–0.7)
EOS PCT: 0 % (ref 0–5)
Eosinophils Absolute: 0 10*3/uL (ref 0.0–0.7)
Eosinophils Relative: 0 % (ref 0–5)
HCT: 42.1 % (ref 39.0–52.0)
HEMATOCRIT: 40.6 % (ref 39.0–52.0)
HEMOGLOBIN: 13.6 g/dL (ref 13.0–17.0)
Hemoglobin: 14.1 g/dL (ref 13.0–17.0)
Lymphocytes Relative: 6 % — ABNORMAL LOW (ref 12–46)
Lymphocytes Relative: 7 % — ABNORMAL LOW (ref 12–46)
Lymphs Abs: 0.7 10*3/uL (ref 0.7–4.0)
Lymphs Abs: 0.9 10*3/uL (ref 0.7–4.0)
MCH: 27.6 pg (ref 26.0–34.0)
MCH: 27.6 pg (ref 26.0–34.0)
MCHC: 33.5 g/dL (ref 30.0–36.0)
MCHC: 33.5 g/dL (ref 30.0–36.0)
MCV: 82.4 fL (ref 78.0–100.0)
MCV: 82.4 fL (ref 78.0–100.0)
MONOS PCT: 11 % (ref 3–12)
Monocytes Absolute: 1.3 10*3/uL — ABNORMAL HIGH (ref 0.1–1.0)
Monocytes Absolute: 1.3 10*3/uL — ABNORMAL HIGH (ref 0.1–1.0)
Monocytes Relative: 11 % (ref 3–12)
NEUTROS ABS: 9.8 10*3/uL — AB (ref 1.7–7.7)
NEUTROS PCT: 82 % — AB (ref 43–77)
Neutro Abs: 10.1 10*3/uL — ABNORMAL HIGH (ref 1.7–7.7)
Neutrophils Relative %: 83 % — ABNORMAL HIGH (ref 43–77)
Platelets: 163 10*3/uL (ref 150–400)
Platelets: 167 10*3/uL (ref 150–400)
RBC: 4.93 MIL/uL (ref 4.22–5.81)
RBC: 5.11 MIL/uL (ref 4.22–5.81)
RDW: 13.5 % (ref 11.5–15.5)
RDW: 13.7 % (ref 11.5–15.5)
WBC: 11.8 10*3/uL — AB (ref 4.0–10.5)
WBC: 12.3 10*3/uL — ABNORMAL HIGH (ref 4.0–10.5)

## 2014-02-16 LAB — URINALYSIS, ROUTINE W REFLEX MICROSCOPIC
BILIRUBIN URINE: NEGATIVE
GLUCOSE, UA: NEGATIVE mg/dL
Hgb urine dipstick: NEGATIVE
Ketones, ur: 40 mg/dL — AB
LEUKOCYTES UA: NEGATIVE
Nitrite: NEGATIVE
PH: 5.5 (ref 5.0–8.0)
Protein, ur: 30 mg/dL — AB
Specific Gravity, Urine: 1.022 (ref 1.005–1.030)
Urobilinogen, UA: 0.2 mg/dL (ref 0.0–1.0)

## 2014-02-16 LAB — URINE MICROSCOPIC-ADD ON

## 2014-02-16 LAB — POCT RAPID STREP A: STREPTOCOCCUS, GROUP A SCREEN (DIRECT): NEGATIVE

## 2014-02-16 MED ORDER — DOXYCYCLINE HYCLATE 100 MG PO TABS
ORAL_TABLET | ORAL | Status: AC
  Filled 2014-02-16: qty 1

## 2014-02-16 MED ORDER — DOXYCYCLINE HYCLATE 100 MG PO CAPS: 100.0000 mg | ORAL_CAPSULE | Freq: Two times a day (BID) | ORAL | Status: DC

## 2014-02-16 MED ORDER — ACETAMINOPHEN 325 MG PO TABS
650.0000 mg | ORAL_TABLET | Freq: Once | ORAL | Status: AC
  Administered 2014-02-16: 650 mg via ORAL

## 2014-02-16 MED ORDER — DOXYCYCLINE HYCLATE 100 MG PO TABS
100.0000 mg | ORAL_TABLET | Freq: Once | ORAL | Status: AC
  Administered 2014-02-16: 100 mg via ORAL

## 2014-02-16 MED ORDER — ACETAMINOPHEN 325 MG PO TABS
ORAL_TABLET | ORAL | Status: AC
  Filled 2014-02-16: qty 2

## 2014-02-16 NOTE — ED Notes (Signed)
PA showed me pt.'s fine red rash on abdomen and back.  She plans to transfer pt.

## 2014-02-16 NOTE — ED Notes (Addendum)
I called pt. and told him the PA was checking on him and noted that he had left the ED before being evaluated.  She was concerned that he did not have a Rx. of Doxycycline, so she e-prescribed it to the [facility] on [address]. He should take BID x 14 days.  She suggested he f/u with his PCP tomorrow.  Instructed pt. to return to the ED if worsening in anyway. Pt. voiced understanding. Roselyn Meier 02/16/2014

## 2014-02-16 NOTE — ED Notes (Signed)
Pt sent here from [facility] for further eval of fever and bodyaches that started last night. reprots recent tick bite, was negative for strep at [facility]. Febrile of 102.4 at [facility], given tylenol and doxy at [facility].

## 2014-02-16 NOTE — ED Notes (Addendum)
Pt   Reports        He  Sustained    A         Tick bite   10  Days  Ago         He  Removed   It  Himself              And    Last pm   He       Developed  A  Fever                   Last  Dose     Motrin      This  Am       He  Reports  Body  Aches  As  Well

## 2014-02-16 NOTE — ED Provider Notes (Signed)
Medical screening examination/treatment/procedure(s) were performed by a resident physician or non-physician practitioner and as the supervising physician I was immediately available for consultation/collaboration.  Lynne Leader, MD    Gregor Hams, MD 02/16/14 [phone]

## 2014-02-16 NOTE — ED Notes (Signed)
Family member brought pager to charge tech and told her that pt was leaving due to wait.

## 2014-02-16 NOTE — ED Provider Notes (Signed)
CSN: [id number]     Arrival date & time 02/16/14  1607 History   First MD Initiated Contact with Patient 02/16/14 1732     Chief Complaint  Patient presents with  . Fever   (Consider location/radiation/quality/duration/timing/severity/associated sxs/prior Treatment) Patient is a 68 y.o. male presenting with fever. The history is provided by the patient. No language interpreter was used.  Fever Max temp prior to arrival:  102 Temp source:  Oral Severity:  Severe Onset quality:  Gradual Duration:  2 days Timing:  Constant Progression:  Worsening Chronicity:  New Relieved by:  Nothing Worsened by:  Nothing tried Associated symptoms: headaches, myalgias, nausea and rash   Associated symptoms: no chest pain   Risk factors: no sick contacts   Pt removed a tick 10 days ago  Past Medical History  Diagnosis Date  . 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 COLON POLYP 10/30/2006    Qualifier: Diagnosis of  By: Drucie Ip    . HYPERLIPIDEMIA 10/30/2006    Qualifier: Diagnosis of  By: Drucie Ip    . Overweight (BMI 25.0-29.9) 10/30/2006    Qualifier: History of  By: McDiarmid MD, Sherren Mocha    . 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 Hypertension 04/24/2009    Qualifier: Diagnosis of  By: McDiarmid MD, Sherren Mocha    . Prediabetes 12/21/2013   Past Surgical History  Procedure Laterality Date  . Coccygectomy  1985  . Vasectomy    . Cataract extraction w/ intraocular lens  implant, bilateral    . Gastrostomy     Family History  Problem Relation Age of Onset  . Hypertension Mother   . Stroke Mother   . Diabetes Father   . Diabetes Brother   . Cancer Brother   . Colon cancer Neg Hx    History  Substance Use Topics  . Smoking status: Never Smoker   . Smokeless tobacco: Never Used  . Alcohol Use: 1.5 oz/week    3  drink(s) per week    Review of Systems  Constitutional: Positive for fever.  Cardiovascular: Negative for chest pain.  Gastrointestinal: Positive for nausea.  Musculoskeletal: Positive for myalgias.  Skin: Positive for rash.  Neurological: Positive for headaches.  All other systems reviewed and are negative.   Allergies  Cephalosporins  Home Medications   Prior to Admission medications   Medication Sig Start Date End Date Taking? Authorizing Provider  aspirin 325 MG tablet Take 1 tablet (325 mg total) by mouth daily. 09/30/12   Zigmund Gottron, MD  Coenzyme Q10 (CO Q 10 PO) Take by mouth.    Historical Provider, MD  lisinopril-hydrochlorothiazide (PRINZIDE,ZESTORETIC) 10-12.5 MG per tablet Take 1 tablet by mouth daily. 12/20/13   Blane Ohara McDiarmid, MD  ranitidine (ZANTAC) 150 MG capsule Take 150 mg by mouth every evening. As needed    Historical Provider, MD   BP 135/94  Pulse 110  Temp(Src) 102.4 F (39.1 C) (Oral)  Resp 21  SpO2 94% Physical Exam  Nursing note and vitals reviewed. Constitutional: He is oriented to person, place, and time. He appears well-developed and well-nourished.  HENT:  Head: Normocephalic and atraumatic.  Mouth/Throat: Oropharynx is clear and moist.  Eyes: EOM are normal. Pupils are equal, round, and reactive to light.  Neck: Normal range of motion.  Cardiovascular: Normal rate.   Pulmonary/Chest: Effort normal.  Abdominal: Soft. He exhibits no distension.  Musculoskeletal: Normal range of motion.  Neurological: He is alert and oriented to person, place, and time.  Skin: Rash noted.  Psychiatric: He has a normal mood and affect.    ED Course  Procedures (including critical care time) Labs Review Labs Reviewed  ROCKY MT[place] SPOTTED FVR AB, IGG-BLOOD  LYME IGG/IGM AB  CBC WITH DIFFERENTIAL  COMPREHENSIVE METABOLIC PANEL  POCT RAPID STREP A ([facility]RG CARE ONLY)    Imaging Review No results found.   MDM Pt meets SIRS criteria,    Appears dehydrated.    I suspect [place] Spotted Fever,   Pt needs admission.   I discussed with Dr. Georgina Snell.   Pt to ED for evaluation.    I discussed with Dr. Jeneen Rinks in ED to make him aware pt is coming.    Pt has blood work pending   1. Fever   2. RMSF [facility] spotted fever)        Fransico Meadow, PA-C 02/16/14 1826

## 2014-02-17 ENCOUNTER — Encounter: Payer: Self-pay | Admitting: Family Medicine

## 2014-02-17 LAB — ROCKY MTN SPOTTED FVR AB, IGG-BLOOD: RMSF IGG: 0.6 IV

## 2014-02-17 NOTE — ED Notes (Signed)
Pt. called on VM.  I called pt. back.  Pt. said he was trying to call the [facility] to schedule an appointment and called here by mistake. Roselyn Meier 02/17/2014

## 2014-02-18 ENCOUNTER — Encounter: Payer: Self-pay | Admitting: Family Medicine

## 2014-02-18 ENCOUNTER — Ambulatory Visit (INDEPENDENT_AMBULATORY_CARE_PROVIDER_SITE_OTHER): Payer: Medicare Other | Admitting: Family Medicine

## 2014-02-18 VITALS — BP 121/72 | HR 78 | Temp 98.8°F | Ht 69.0 in | Wt 199.0 lb

## 2014-02-18 DIAGNOSIS — R509 Fever, unspecified: Secondary | ICD-10-CM | POA: Insufficient documentation

## 2014-02-18 LAB — COMPREHENSIVE METABOLIC PANEL
ALBUMIN: 4 g/dL (ref 3.5–5.2)
ALT: 22 U/L (ref 0–53)
AST: 20 U/L (ref 0–37)
Alkaline Phosphatase: 35 U/L — ABNORMAL LOW (ref 39–117)
BUN: 22 mg/dL (ref 6–23)
CHLORIDE: 104 meq/L (ref 96–112)
CO2: 28 mEq/L (ref 19–32)
Calcium: 9.5 mg/dL (ref 8.4–10.5)
Creat: 1.21 mg/dL (ref 0.50–1.35)
GLUCOSE: 100 mg/dL — AB (ref 70–99)
POTASSIUM: 4.8 meq/L (ref 3.5–5.3)
Sodium: 140 mEq/L (ref 135–145)
Total Bilirubin: 0.5 mg/dL (ref 0.2–1.2)
Total Protein: 6.8 g/dL (ref 6.0–8.3)

## 2014-02-18 LAB — CBC
HCT: 41.5 % (ref 39.0–52.0)
HEMOGLOBIN: 14.4 g/dL (ref 13.0–17.0)
MCH: 27.9 pg (ref 26.0–34.0)
MCHC: 34.7 g/dL (ref 30.0–36.0)
MCV: 80.3 fL (ref 78.0–100.0)
Platelets: 163 10*3/uL (ref 150–400)
RBC: 5.17 MIL/uL (ref 4.22–5.81)
RDW: 14.2 % (ref 11.5–15.5)
WBC: 7.6 10*3/uL (ref 4.0–10.5)

## 2014-02-18 LAB — CULTURE, GROUP A STREP

## 2014-02-18 NOTE — Assessment & Plan Note (Signed)
A: fever in adult in the setting tick bite. Now resolved. No doxycyline to cover for RMSF. Normal exam today. Normal urine output. P: Please complete course of doxycyline. Blood work today (CMP (Na and LFTs) and  CBC (platelets)  I will be in touch with results.  Follow with Dr. McDiarmid in about 3 weeks.

## 2014-02-18 NOTE — Progress Notes (Signed)
   Subjective:    Patient ID: Christopher Wall, male    DOB: May 24, 1946, 68 y.o.   MRN: [id number] CC: f/u febrile illness, tick bite  HPI 67 yo M with history of HTN presents for f/u visit. He was seen in ED two days ago for fever in the setting of tick bite. Also had malaise and ? Petechia on back. Of note he had recent colonoscopy and polypectomy. Denies GI bleeding and diarrhea. Reports feeling very well today, nearly 100%. Reports normal urine output. No CP or SOB. Compliant with doxycyline.   Soc hx: non smoker  Review of Systems As per HPI     Objective:   Physical Exam BP 121/72  Pulse 78  Temp(Src) 98.8 F (37.1 C) (Oral)  Ht 5\' 9"  (1.753 m)  Wt 199 lb (90.266 kg)  BMI 29.37 kg/m2 General appearance: alert, cooperative and no distress Lungs: clear to auscultation bilaterally Heart: regular rate and rhythm, S1, S2 normal, no murmur, click, rub or gallop Skin: Skin color, texture, turgor normal. No rashes or lesions multiple angiomas on back no petechia.       Assessment & Plan:

## 2014-02-18 NOTE — Patient Instructions (Signed)
Mr. Essick,  Thank you for coming in today. I am happy to hear that you are feeling better.  Please complete course of doxycyline. Blood work today. I will be in touch with results.  Follow with Dr. McDiarmid in about 3 weeks.  Dr. Adrian Blackwater

## 2014-02-21 ENCOUNTER — Encounter: Payer: Self-pay | Admitting: *Deleted

## 2014-03-10 ENCOUNTER — Encounter: Payer: Self-pay | Admitting: *Deleted

## 2015-04-20 ENCOUNTER — Other Ambulatory Visit: Payer: Self-pay | Admitting: Family Medicine

## 2015-04-20 DIAGNOSIS — I10 Essential (primary) hypertension: Secondary | ICD-10-CM

## 2015-04-20 NOTE — Telephone Encounter (Signed)
Will forward to MD.  PCP is out of office until Tuesday but can fill when returns since patient will not be out of medication for a couple of weeks. Blaine Hari,CMA

## 2015-04-20 NOTE — Telephone Encounter (Signed)
Has appt 06-08-15. Will run out of lisiniopril in the next 2 weeks. Could dr Wendy Poet call in a rx to last until then? Please advise

## 2015-04-21 MED ORDER — LISINOPRIL-HYDROCHLOROTHIAZIDE 10-12.5 MG PO TABS: 1.0000 | ORAL_TABLET | Freq: Every day | ORAL | Status: DC

## 2015-06-08 ENCOUNTER — Ambulatory Visit
Admission: RE | Admit: 2015-06-08 | Discharge: 2015-06-08 | Disposition: A | Discharge status: Home / Self Care | Payer: Medicare Other | Source: Ambulatory Visit | Attending: Family Medicine | Admitting: Family Medicine

## 2015-06-08 ENCOUNTER — Encounter: Payer: Self-pay | Admitting: Family Medicine

## 2015-06-08 ENCOUNTER — Ambulatory Visit (HOSPITAL_COMMUNITY)
Admission: RE | Admit: 2015-06-08 | Discharge: 2015-06-08 | Disposition: A | Discharge status: Home / Self Care | Payer: Medicare Other | Source: Ambulatory Visit | Attending: Family Medicine | Admitting: Family Medicine

## 2015-06-08 ENCOUNTER — Ambulatory Visit (INDEPENDENT_AMBULATORY_CARE_PROVIDER_SITE_OTHER): Payer: Medicare Other | Admitting: Family Medicine

## 2015-06-08 VITALS — BP 141/75 | HR 67 | Temp 98.2°F | Ht 69.0 in | Wt 194.4 lb

## 2015-06-08 DIAGNOSIS — R079 Chest pain, unspecified: Secondary | ICD-10-CM

## 2015-06-08 DIAGNOSIS — R413 Other amnesia: Secondary | ICD-10-CM

## 2015-06-08 DIAGNOSIS — E785 Hyperlipidemia, unspecified: Secondary | ICD-10-CM

## 2015-06-08 DIAGNOSIS — R918 Other nonspecific abnormal finding of lung field: Secondary | ICD-10-CM

## 2015-06-08 DIAGNOSIS — Z609 Problem related to social environment, unspecified: Secondary | ICD-10-CM

## 2015-06-08 DIAGNOSIS — R7303 Prediabetes: Secondary | ICD-10-CM | POA: Diagnosis not present

## 2015-06-08 DIAGNOSIS — Z23 Encounter for immunization: Secondary | ICD-10-CM

## 2015-06-08 DIAGNOSIS — R001 Bradycardia, unspecified: Secondary | ICD-10-CM | POA: Insufficient documentation

## 2015-06-08 DIAGNOSIS — Z7289 Other problems related to lifestyle: Secondary | ICD-10-CM

## 2015-06-08 DIAGNOSIS — I10 Essential (primary) hypertension: Secondary | ICD-10-CM | POA: Diagnosis not present

## 2015-06-08 DIAGNOSIS — R911 Solitary pulmonary nodule: Secondary | ICD-10-CM

## 2015-06-08 DIAGNOSIS — Z125 Encounter for screening for malignant neoplasm of prostate: Secondary | ICD-10-CM

## 2015-06-08 DIAGNOSIS — E7849 Other hyperlipidemia: Secondary | ICD-10-CM

## 2015-06-08 LAB — LIPID PANEL
CHOLESTEROL: 241 mg/dL — AB (ref 125–200)
HDL: 50 mg/dL (ref >=40)
LDL Cholesterol: 153 mg/dL — ABNORMAL HIGH (ref <130)
Total CHOL/HDL Ratio: 4.8 Ratio (ref <=5.0)
Triglycerides: 191 mg/dL — ABNORMAL HIGH (ref <150)
VLDL: 38 mg/dL — ABNORMAL HIGH (ref <30)

## 2015-06-08 LAB — BASIC METABOLIC PANEL
BUN: 17 mg/dL (ref 7–25)
CALCIUM: 9.3 mg/dL (ref 8.6–10.3)
CO2: 31 mmol/L (ref 20–31)
CREATININE: 0.99 mg/dL (ref 0.70–1.25)
Chloride: 101 mmol/L (ref 98–110)
GLUCOSE: 80 mg/dL (ref 65–99)
Potassium: 4.1 mmol/L (ref 3.5–5.3)
Sodium: 142 mmol/L (ref 135–146)

## 2015-06-08 LAB — POCT GLYCOSYLATED HEMOGLOBIN (HGB A1C): Hemoglobin A1C: 5.6

## 2015-06-08 MED ORDER — LISINOPRIL-HYDROCHLOROTHIAZIDE 10-12.5 MG PO TABS: 1.0000 | ORAL_TABLET | Freq: Every day | ORAL | Status: DC

## 2015-06-08 NOTE — Patient Instructions (Addendum)
Your blood pressure is in a good range.  Keep taking the lisinopril/hctz tablet daily.  Recommend starting an Aspirin 81 mg daily for prevention of Myocardial Infarction.   Labs drawn today: PSA, A1c, Lipids, BMET, and Hepatitis C screening serology.   We will call once we have scheduled your appointment with Dr Valentina Shaggy (Neuropsychologist) for your memory concern.   Based on your last Lipid panel measurements in 2015, your risk of a Cardiovascular event in next 10 years is 9.5% using the ACC/AHA model.  The recommendation is that patients with risks >=7.5% consider starting a daily moderate to high potency statin, like atorvastatin.   Chest Pain Observation It is often hard to give a specific diagnosis for the cause of chest pain. Among other possibilities your symptoms might be caused by inadequate oxygen delivery to your heart (angina). Angina that is not treated or evaluated can lead to a heart attack (myocardial infarction) or death. Blood tests, electrocardiograms, and X-rays may have been done to help determine a possible cause of your chest pain. After evaluation and observation, your health care provider has determined that it is unlikely your pain was caused by an unstable condition that requires hospitalization. However, a full evaluation of your pain may need to be completed, with additional diagnostic testing as directed. It is very important to keep your follow-up appointments. Not keeping your follow-up appointments could result in permanent heart damage, disability, or death. If there is any problem keeping your follow-up appointments, you must call your health care provider. HOME CARE INSTRUCTIONS  Due to the slight chance that your pain could be angina, it is important to follow your health care provider's treatment plan and also maintain a healthy lifestyle:  Maintain or work toward achieving a healthy weight.  Stay physically active and exercise regularly.  Decrease your salt  intake.  Eat a balanced, healthy diet. Talk to a dietitian to learn about heart-healthy foods.  Increase your fiber intake by including whole grains, vegetables, fruits, and nuts in your diet.  Avoid situations that cause stress, anger, or depression.  Take medicines as advised by your health care provider. Report any side effects to your health care provider. Do not stop medicines or adjust the dosages on your own.  Quit smoking. Do not use nicotine patches or gum until you check with your health care provider.  Keep your blood pressure, blood sugar, and cholesterol levels within normal limits.  Limit alcohol intake to no more than 1 drink per day for women who are not pregnant and 2 drinks per day for men.  Do not abuse drugs. SEEK IMMEDIATE MEDICAL CARE IF: You have severe chest pain or pressure which may include symptoms such as:  You feel pain or pressure in your arms, neck, jaw, or back.  You have severe back or abdominal pain, feel sick to your stomach (nauseous), or throw up (vomit).  You are sweating profusely.  You are having a fast or irregular heartbeat.  You feel short of breath while at rest.  You notice increasing shortness of breath during rest, sleep, or with activity.  You have chest pain that does not get better after rest or after taking your usual medicine.  You wake from sleep with chest pain.  You are unable to sleep because you cannot breathe.  You develop a frequent cough or you are coughing up blood.  You feel dizzy, faint, or experience extreme fatigue.  You develop severe weakness, dizziness, fainting, or chills. Any of  these symptoms may represent a serious problem that is an emergency. Do not wait to see if the symptoms will go away. Call your local emergency services (911 in the [place] Do not drive yourself to the hospital. MAKE SURE YOU:  Understand these instructions.  Will watch your condition.  Will get help right away if you are  not doing well or get worse.   This information is not intended to replace advice given to you by your health care provider. Make sure you discuss any questions you have with your health care provider.   Document Released: 09/21/2010 Document Revised: 08/24/2013 Document Reviewed: 02/18/2013 Elsevier Interactive Patient Education [employer].

## 2015-06-09 ENCOUNTER — Encounter: Payer: Self-pay | Admitting: Family Medicine

## 2015-06-09 DIAGNOSIS — R413 Other amnesia: Secondary | ICD-10-CM | POA: Insufficient documentation

## 2015-06-09 DIAGNOSIS — R911 Solitary pulmonary nodule: Secondary | ICD-10-CM

## 2015-06-09 HISTORY — DX: Solitary pulmonary nodule: R91.1

## 2015-06-09 HISTORY — DX: Other amnesia: R41.3

## 2015-06-09 LAB — PSA, MEDICARE: PSA: 2.14 ng/mL (ref <=4.00)

## 2015-06-09 LAB — HEPATITIS C ANTIBODY: HCV Ab: NEGATIVE

## 2015-06-09 NOTE — Assessment & Plan Note (Signed)
Established issue Apparently a 4 mm subpleural LLL nodule found on Chest CT by cardiology that was stable thru 2009.  Radiology recommended no further follow up necessary.  Pt is a non-smoker.  Pt requested a follow up CXR CXR 06/08/15 was normal.  No further follow up needed unless symptoms develop.

## 2015-06-09 NOTE — Assessment & Plan Note (Signed)
Established problem. Stable. Continue current therapy with lisinopril/hctz Basic Metabolic Panel:    Component Value Date/Time   NA 142 06/08/2015 1229   K 4.1 06/08/2015 1229   CL 101 06/08/2015 1229   CO2 31 06/08/2015 1229   BUN 17 06/08/2015 1229   CREATININE 0.99 06/08/2015 1229   CREATININE 1.14 02/16/2014 1934   GLUCOSE 80 06/08/2015 1229   CALCIUM 9.3 06/08/2015 1229

## 2015-06-09 NOTE — Assessment & Plan Note (Signed)
Established Subjective concern Pt reports difficulty with recalling unfamiliar nouns.  Wife is concerned because he forgets tasks.  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  He drives, manages his own personal and business finances.  Patient requesting repeat formal Neuropsychologic testing.  Dr Valentina Shaggy does not take Dr Ignacia Palma [employer] program.  Will attempt to get him an appointment with [facility] as per Dr Monico Hoar recommendations.   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

## 2015-06-09 NOTE — Assessment & Plan Note (Signed)
Established problem that has improved.  A1c 5.6 down from previous 5.9%. Continue lifestyle changes.  Monitor periodically if patient shows changes in metabolic profile.

## 2015-06-09 NOTE — Progress Notes (Signed)
Subjective:    Patient ID: Christopher Wall, male    DOB: [birth year], 69 y.o.   MRN: [id number]  HPI CHRONIC HYPERTENSION  Disease Monitoring  Blood pressure range: runs less than 140/90 when he checks it at office  Chest pain: yes, see below   Dyspnea: no   Claudication: no   Medication compliance: yes  Medication Side Effects  Lightheadedness: yes, see below  Urinary frequency: no   Edema: no    Preventitive Healthcare:  Exercise: yes   Diet Pattern: healthy  Salt Restriction: no    Pre-DIABETES Noted elevated A1c 5.9% 12/20/13 ov Working on lifestyle modification Disease Monitoring: Blood Sugar ranges-not checking at home Visual problems- no  Medications: No polyuria, no polydipsia    HYPERLIPIDEMIA  Disease Monitoring: See symptoms for Hypertension  Lab Results  Component Value Date   CHOL 241* 06/08/2015   CHOL 221* 12/20/2013   CHOL 216* 03/17/2012   Lab Results  Component Value Date   HDL 50 06/08/2015   HDL 50 12/20/2013   HDL 54 03/17/2012   Lab Results  Component Value Date   LDLCALC 153* 06/08/2015   LDLCALC 113* 12/20/2013   LDLCALC 131* 03/17/2012   Lab Results  Component Value Date   TRIG 191* 06/08/2015   TRIG 290* 12/20/2013   TRIG 153* 03/17/2012   Lab Results  Component Value Date   CHOLHDL 4.8 06/08/2015   CHOLHDL 4.4 12/20/2013   CHOLHDL 4.0 03/17/2012   Pt working on lifestyle modification. Would consider a statin.  Previous ACC/AHA 10-yr CVE risk 9.5% in 2015.    ROS See HPI above   PMH Smoking Status noted     Chest Pain Christopher Wall complains of chest pain. Onset was 2 months ago. Symptoms have been stable since that time. The patient's pain is constant. The patient describes the pain as dull and does not radiate. Patient rates pain as very mild  in intensity. Associated symptoms are: none. Aggravating factors are: none. Alleviating factors are: none. Patient's cardiac risk factors are: advanced age  (older than 2 for men, 27 for women), dyslipidemia, hypertension, male gender and obesity (BMI >= 30 kg/m2). Patient's risk factors for DVT/PE: none. Previous cardiac testing: none.  Dizziness Patient presents for evaluation of dizziness. The symptoms started awhile ago and have been intermittent. The attacks occur intermittently and last 1 minute. Positions that worsen symptoms: standing up. Previous workup/treatments: none. Associated ear symptoms: none. Associated CNS symptoms: none. Recent infections: none. Head trauma: denied. Drug ingestion: Antihypertensives. Noise exposure: occupational.   Subjective:   Memory changes  Christopher Wall is a 69 y.o. male here for evaluation of memory problems. He is accompanied by no one. Primary caregiver is patient and self. Patient lives with their spouse. The family and the patient identify problems with changes in short and long term memory. Family and patient report problems with forgetting tasks. They are not concerned about medication errors, wandering, driving and cooking. Medication administration: patient self medicates.  The following portions of the patient's history were reviewed and updated as appropriate: allergies, current medications, past family history, past medical history, past social history, past surgical history and problem list.      Plan:       Review of Systems  HENT: Positive for tinnitus.   Cardiovascular: Positive for chest pain.  Musculoskeletal: Positive for arthralgias.  Neurological: Positive for dizziness.  Psychiatric/Behavioral:       Memory problems   All other systems reviewed  and are negative.  Spot on CXR - Uncertain onset; couple years ago, Someone saw a spot on pt's CXR. I believe pt talking about 4 mm conccal subopleural LLL nodule seen 10/2006 by cardiology. Repeat CT chest stable in size. No further imaging recommended per Radiolgy statement from [facility] - No associated symptoms.    - last  CXR in [facility] 09/2012 did not show any abnormality  Objective:   Physical Exam  Constitutional: He is oriented to person, place, and time. He appears well-developed. No distress.  HENT:  Right Ear: External ear normal.  Left Ear: External ear normal.  Eyes: Pupils are equal, round, and reactive to light.  Neck: Normal range of motion. Neck supple. No thyromegaly present.  Cardiovascular: Normal rate, regular rhythm, normal heart sounds and intact distal pulses.   Pulmonary/Chest: Effort normal and breath sounds normal.  Abdominal: Soft. Bowel sounds are normal. He exhibits no mass.  No HSM  Musculoskeletal: He exhibits no edema.  Neurological: He is alert and oriented to person, place, and time.  Skin: Skin is warm and dry.  Psychiatric: He has a normal mood and affect. His behavior is normal. Judgment and thought content normal.          Assessment & Plan:

## 2015-06-09 NOTE — Assessment & Plan Note (Addendum)
Established problem  Lipid Panel     Component Value Date/Time   CHOL 241* 06/08/2015 1229   TRIG 191* 06/08/2015 1229   HDL 50 06/08/2015 1229   CHOLHDL 4.8 06/08/2015 1229   VLDL 38* 06/08/2015 1229   LDLCALC 153* 06/08/2015 1229  Worsened LDL control ACC/AHA 10-yr risk = 23% ==> recommendation of moderate to high-intensity statin recommendation.  He said he would try a trial of statin to see if he tolerates it, particularly the associated myalgias.   Rx for Atorvastatin 40 mg sent to his pharmacy.  #90 with RF 3.

## 2015-06-12 ENCOUNTER — Encounter: Payer: Self-pay | Admitting: Family Medicine

## 2015-06-12 ENCOUNTER — Telehealth: Payer: Self-pay | Admitting: Family Medicine

## 2015-06-12 MED ORDER — ATORVASTATIN CALCIUM 40 MG PO TABS: 40.0000 mg | ORAL_TABLET | Freq: Every day | ORAL | Status: DC

## 2015-06-12 NOTE — Telephone Encounter (Signed)
I discussed lab results, CXR, and EKG results from 06/08/15 ov with Dr Lovena Le.   We discussed his elevated LDL cholesterol level which is consistent with previous LDL measurements for him. We discussed his ACC/AHA calculated 10 yr CV event risk of 23% and its accompanying recommendation for moderate to high potency statin use. He agreed to a trial of atorvastatin 40 mg daily. He agreed to stop this new medication and notify our office should he develop RUQ, jaundice or myalgias. Rx for atorvastatin sent online to his pharmacy.

## 2015-06-12 NOTE — Addendum Note (Signed)
Addended byWendy Poet, Adonias Demore D on: 06/12/2015 10:16 AM   Modules accepted: Orders

## 2015-07-11 ENCOUNTER — Encounter: Payer: Self-pay | Admitting: Family Medicine

## 2016-06-10 ENCOUNTER — Other Ambulatory Visit: Payer: Self-pay | Admitting: Family Medicine

## 2016-06-10 DIAGNOSIS — I10 Essential (primary) hypertension: Secondary | ICD-10-CM

## 2017-11-18 ENCOUNTER — Encounter: Payer: Self-pay | Admitting: Family Medicine

## 2017-12-02 ENCOUNTER — Telehealth: Payer: Self-pay | Admitting: Family Medicine

## 2017-12-02 NOTE — Telephone Encounter (Signed)
Yes. He may switch.

## 2017-12-02 NOTE — Telephone Encounter (Signed)
Will forward to MD to advise.  

## 2017-12-02 NOTE — Telephone Encounter (Signed)
Pt is a Pharmacist, community.  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  He wants to schedule his early physical.  2 family members have recently been diagnosed with esophogeal  cancer and he wants to be screened for that.  Could he switch to another faculty member who could see him on a Friday or Monday?

## 2017-12-03 NOTE — Telephone Encounter (Signed)
LM for patient to call back.  Please schedule him with a faculty provider on either Friday or Monday.  Ok per Dr. Wendy Poet.   Thanks [employer]

## 2017-12-05 ENCOUNTER — Other Ambulatory Visit: Payer: Self-pay

## 2017-12-05 ENCOUNTER — Ambulatory Visit (HOSPITAL_COMMUNITY)
Admission: RE | Admit: 2017-12-05 | Discharge: 2017-12-05 | Disposition: A | Discharge status: Home / Self Care | Payer: Medicare HMO | Source: Ambulatory Visit | Attending: Family Medicine | Admitting: Family Medicine

## 2017-12-05 ENCOUNTER — Ambulatory Visit (INDEPENDENT_AMBULATORY_CARE_PROVIDER_SITE_OTHER): Payer: Medicare HMO | Admitting: Family Medicine

## 2017-12-05 ENCOUNTER — Encounter: Payer: Self-pay | Admitting: Family Medicine

## 2017-12-05 VITALS — BP 128/72 | HR 81 | Temp 98.7°F | Ht 69.0 in | Wt 198.6 lb

## 2017-12-05 DIAGNOSIS — Z23 Encounter for immunization: Secondary | ICD-10-CM | POA: Diagnosis not present

## 2017-12-05 DIAGNOSIS — E7800 Pure hypercholesterolemia, unspecified: Secondary | ICD-10-CM

## 2017-12-05 DIAGNOSIS — R0789 Other chest pain: Secondary | ICD-10-CM | POA: Diagnosis not present

## 2017-12-05 DIAGNOSIS — R7303 Prediabetes: Secondary | ICD-10-CM | POA: Diagnosis not present

## 2017-12-05 HISTORY — DX: Other chest pain: R07.89

## 2017-12-05 LAB — POCT GLYCOSYLATED HEMOGLOBIN (HGB A1C): Hemoglobin A1C: 5.8

## 2017-12-05 MED ORDER — TETANUS-DIPHTH-ACELL PERTUSSIS 5-2-15.5 LF-MCG/0.5 IM SUSP: 0.5000 mL | Freq: Once | INTRAMUSCULAR | 0 refills | Status: AC

## 2017-12-05 MED ORDER — TETANUS-DIPHTH-ACELL PERTUSSIS 5-2.5-18.5 LF-MCG/0.5 IM SUSP: 0.5000 mL | Freq: Once | INTRAMUSCULAR | Status: DC

## 2017-12-05 NOTE — Assessment & Plan Note (Signed)
Substernal, timed with eating and not with activity, no radiation to arm/neck/back.  Feels like burning and specifically no pleuritic or w/SOB.  Sometimes worse if laying down after eating.  Also with fam hx of 2 brothers with esophogeal cancer  Likely GI related (refer to GI) but will also order A1C (prediabetic 58yrs ago) and lipid panel (has been on/off statins past as weight fluctuated and is currently off).  Will also get ECG (normal in 2016)

## 2017-12-05 NOTE — Assessment & Plan Note (Addendum)
5.9 A1c in 2016, rechecked today at 5.8  Will defer to PCP about calling him to offer metformin (epic message sent)

## 2017-12-05 NOTE — Assessment & Plan Note (Signed)
Patient had stopped statin when he lost weight.   Will recheck and likely restart as he has gained weight back

## 2017-12-05 NOTE — Patient Instructions (Signed)
It was a pleasure to see you today! Thank you for choosing [facility] for your primary care. Christopher Wall was seen for atypical chest pain and wanting esophageal cancer screening. Come back to the clinic if you have any new concerns, and go to the emergency room if you have any life threatening symptoms.  Today we referred you to the GI doc, took an ECG, and checked some labs for cholesterol and diabetes.   If we did any lab work today, and the results require attention, either me or my nurse will get in touch with you. If everything is normal, you will get a letter in mail and a message via . If you don't hear from [place] in two weeks, please give [place] a call. Otherwise, we look forward to seeing you again at your next visit. If you have any questions or concerns before then, please call the clinic at [phone].  Please bring all your medications to every doctors visit  Sign up for My Chart to have easy access to your labs results, and communication with your Primary care physician.    Please check-out at the front desk before leaving the clinic.    Best,  Dr. Sherene Sires FAMILY MEDICINE RESIDENT - PGY1 12/05/2017 11:47 AM

## 2017-12-05 NOTE — Progress Notes (Signed)
    Subjective:  Christopher Wall is a 72 y.o. male who presents to the [facility] today with a chief complaint of atypical chest pain and concern over 2nd brother diagnosed with esophageal cancer.   Patient recently found out second brother diagnosed with esophageal cancer (one has died from this).  The heme-onc for his second brother suggested that he be evaluated due to the significant family history.  When he received this call he became more concerned about substernal/gastric chest pains that he has been having for years now that he hasn't had worked up.  These chest pains are specifically lower substernal.  He is convinced they are gastric and not cardiac related.  He says they are timed with after eating and not timed with activity level at all.  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  He says it is a burning pain that stays in one place.  It does not radiate to neck/arm/back.  It is not a stabbing/pressure feeling.  There is no associated SOB/pleuritic pain.  He has no orthopnea and no edema in his legs.  He has no SOB on exertion.  Objective:  Physical Exam: BP 128/72   Pulse 81   Temp 98.7 F (37.1 C) (Oral)   Ht 5\' 9"  (1.753 m)   Wt 198 lb 9.6 oz (90.1 kg)   SpO2 93%   BMI 29.33 kg/m   Gen: NAD, conversing comfortably, mildly obese around abdomen CV: RRR with no murmurs appreciated Pulm: NWOB, CTAB with no crackles, wheezes, or rhonchi GI: Normal bowel sounds present. Soft, Nontender, Nondistended.  Some mild LLQ tenderness with deep palpation MSK: no edema, cyanosis, or clubbing noted Skin: warm, dry Neuro: grossly normal, moves all extremities Psych: Normal affect and thought content  Results for orders placed or performed in visit on 12/05/17 (from the past 72 hour(s))  HgB A1c     Status: None   Collection Time: 12/05/17 12:20 PM  Result Value Ref Range   Hemoglobin A1C 5.8      Assessment/Plan:  Atypical chest pain Substernal, timed with eating and not with activity, no  radiation to arm/neck/back.  Feels like burning and specifically no pleuritic or w/SOB.  Sometimes worse if laying down after eating.  Also with fam hx of 2 brothers with esophogeal cancer  Likely GI related (refer to GI) but will also order A1C (prediabetic 11yrs ago) and lipid panel (has been on/off statins past as weight fluctuated and is currently off).  Will also get ECG (normal in 2016)   Prediabetes 5.9 A1c in 2016, rechecked today at 5.8  Will defer to PCP about calling him to offer metformin (epic message sent)  Pure hypercholesterolemia Patient had stopped statin when he lost weight.   Will recheck and likely restart as he has gained weight back   Sherene Sires, [place] - PGY1 12/05/2017 3:40 PM

## 2017-12-06 LAB — LIPID PANEL
Chol/HDL Ratio: 5.2 ratio — ABNORMAL HIGH (ref 0.0–5.0)
Cholesterol, Total: 243 mg/dL — ABNORMAL HIGH (ref 100–199)
HDL: 47 mg/dL (ref >39)
Triglycerides: 423 mg/dL — ABNORMAL HIGH (ref 0–149)

## 2017-12-08 ENCOUNTER — Telehealth: Payer: Self-pay | Admitting: Family Medicine

## 2017-12-08 NOTE — Telephone Encounter (Signed)
Called and left a message for patient to call back and discuss lab results.  High cholesterol/triglycerides: will suggest lipitor 40 and potentially some fish oil if they would like based on ACC/AAFP guidelines  Prediabetes w/ A1C 5.8, minimally in the prediabetes range, could still manage with diet/activity if he chooses or we could offer low dose metformin.  -Dr. Criss Rosales

## 2017-12-08 NOTE — Telephone Encounter (Signed)
Pt called back to speak with Dr. Criss Rosales, left additional number to call. Call back [phone] Wallace Cullens, RN

## 2017-12-25 ENCOUNTER — Encounter: Payer: Medicare Other | Admitting: Family Medicine

## 2017-12-26 ENCOUNTER — Encounter: Payer: Self-pay | Admitting: Gastroenterology

## 2017-12-26 ENCOUNTER — Ambulatory Visit: Payer: Medicare HMO | Admitting: Gastroenterology

## 2017-12-26 VITALS — BP 140/70 | HR 72 | Ht 69.0 in | Wt 200.0 lb

## 2017-12-26 DIAGNOSIS — R0789 Other chest pain: Secondary | ICD-10-CM | POA: Diagnosis not present

## 2017-12-26 DIAGNOSIS — Z800 Family history of malignant neoplasm of digestive organs: Secondary | ICD-10-CM

## 2017-12-26 HISTORY — DX: Family history of malignant neoplasm of digestive organs: Z80.0

## 2017-12-26 NOTE — Progress Notes (Signed)
12/26/2017 Christopher Wall [id number] 1945/11/11   HISTORY OF PRESENT ILLNESS:  This is a pleasant 72 year old male who is known to Dr. Ardis Hughs for colonoscopy.  He presents to our office today at the request of Dr. Criss Rosales for complaints of atypical chest pain.  He says that he was having some mild substernal chest pain for a couple of months.  Started Nexium 20 mg daily a couple of weeks ago and the chest pain has responded well/resolved.  He does have a family history of esophageal cancer in 2 brothers, however, so it was recommended that he have an EGD.  His older brother died of esophageal cancer several years ago and his younger brother was just diagnosed with this a couple of months ago.  Patient denies dysphagia.     Past Medical History:  Diagnosis Date  . 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 COLON POLYP 10/30/2006   Qualifier: Diagnosis of  By: Drucie Ip    . Encounter for neuropsychological testing 06/26/2015   [facility]: Only B/L avg in rote memory auditory info encoding. No mood dysfnc  . Encounter for neuropsychological testing 2008   [facility]setti (Neuropsy): Mixed anxiety and depression o/w normal.   . GASTROESOPHAGEAL REFLUX, NO ESOPHAGITIS 10/30/2006   Qualifier: History of  By: McDiarmid MD, Sherren Mocha    . 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 HYPERLIPIDEMIA 10/30/2006   Qualifier: Diagnosis of  By: Drucie Ip    . Hypertension 04/24/2009   Qualifier: Diagnosis of  By: McDiarmid MD, Sherren Mocha    . Overweight (BMI 25.0-29.9) 10/30/2006   Qualifier: History of  By: McDiarmid MD, Sherren Mocha    . Prediabetes 12/21/2013  . Pulmonary nodule, left 06/09/2015  . Pure hypercholesterolemia 10/30/2006   ACC calculation 10-yr risk of CV event of 9.5% with recommendation to consider Statin therapy of moderate-to-high potency (12/21/13)     Past Surgical History:  Procedure Laterality Date  . CATARACT  EXTRACTION W/ INTRAOCULAR LENS  IMPLANT, BILATERAL    . COCCYGECTOMY  1985  . GASTROSTOMY    . VASECTOMY      reports that he has never smoked. He has never used smokeless tobacco. He reports that he drinks about 1.5 oz of alcohol per week. He reports that he does not use drugs. family history includes Dementia (age of onset: 44) in his mother; Diabetes in his brother and father; Esophageal cancer in his brother and brother; Hypertension in his mother; Stroke (age of onset: 39) in his mother. Allergies  Allergen Reactions  . Cephalosporins Rash      Outpatient Encounter Medications as of 12/26/2017  Medication Sig  . esomeprazole (NEXIUM) 20 MG capsule Take 20 mg by mouth daily at 12 noon.  Marland Kitchen lisinopril-hydrochlorothiazide (PRINZIDE,ZESTORETIC) 10-12.5 MG tablet TAKE ONE TABLET BY MOUTH ONCE DAILY  . ranitidine (ZANTAC) 150 MG capsule Take 150 mg by mouth every evening. As needed   No facility-administered encounter medications on file as of 12/26/2017.      REVIEW OF SYSTEMS  : All other systems reviewed and negative except where noted in the History of Present Illness.   PHYSICAL EXAM: BP 140/70   Pulse 72   Ht 5\' 9"  (1.753 m)   Wt 200 lb (90.7 kg)   BMI 29.53 kg/m  General: Well developed white male in no acute distress Head: Normocephalic and atraumatic Eyes:  Sclerae anicteric, conjunctiva pink.  Ears: Normal auditory acuity  Lungs: Clear throughout to auscultation; no increased WOB. Heart: Regular rate and rhythm; no M/R/G. Abdomen: Soft, non-distended.  BS present.  Non-tender. Musculoskeletal: Symmetrical with no gross deformities  Skin: No lesions on visible extremities Extremities: No edema  Neurological: Alert oriented x 4, grossly non-focal Psychological:  Alert and cooperative. Normal mood and affect  ASSESSMENT AND PLAN: *Atypical chest pain that responded well to Nexium 20 mg daily.  Also has family history of esophageal cancer in 2 brothers so oncology  recommended EGD.  Will schedule with Dr. Ardis Hughs.  **The risks, benefits, and alternatives to EGD were discussed with the patient and he consents to proceed.    CC:  Sherene Sires, DO

## 2017-12-26 NOTE — Patient Instructions (Signed)
If you are age 72 or older, your body mass index should be between 23-30. Your Body mass index is 29.53 kg/m. If this is out of the aforementioned range listed, please consider follow up with your Primary Care Provider.  If you are age 71 or younger, your body mass index should be between 19-25. Your Body mass index is 29.53 kg/m. If this is out of the aformentioned range listed, please consider follow up with your Primary Care Provider.   You have been scheduled for an endoscopy. Please follow written instructions given to you at your visit today. If you use inhalers (even only as needed), please bring them with you on the day of your procedure. Your physician has requested that you go to www.startemmi.com and enter the access code given to you at your visit today. This web site gives a general overview about your procedure. However, you should still follow specific instructions given to you by our office regarding your preparation for the procedure.  Thank you for choosing me and [facility].   Alonza Bogus, PA

## 2017-12-28 NOTE — Progress Notes (Signed)
I agree with the above note, plan 

## 2018-01-23 DIAGNOSIS — H524 Presbyopia: Secondary | ICD-10-CM | POA: Diagnosis not present

## 2018-02-09 ENCOUNTER — Encounter: Payer: Medicare HMO | Admitting: Gastroenterology

## 2018-02-13 ENCOUNTER — Encounter: Payer: Self-pay | Admitting: Gastroenterology

## 2018-02-27 ENCOUNTER — Other Ambulatory Visit: Payer: Self-pay

## 2018-02-27 ENCOUNTER — Encounter: Payer: Self-pay | Admitting: Gastroenterology

## 2018-02-27 ENCOUNTER — Ambulatory Visit (AMBULATORY_SURGERY_CENTER): Payer: Medicare HMO | Admitting: Gastroenterology

## 2018-02-27 VITALS — BP 117/68 | HR 65 | Temp 96.9°F | Resp 22 | Ht 69.0 in | Wt 200.0 lb

## 2018-02-27 DIAGNOSIS — K2990 Gastroduodenitis, unspecified, without bleeding: Secondary | ICD-10-CM

## 2018-02-27 DIAGNOSIS — K2100 Gastro-esophageal reflux disease with esophagitis, without bleeding: Secondary | ICD-10-CM

## 2018-02-27 DIAGNOSIS — R0789 Other chest pain: Secondary | ICD-10-CM

## 2018-02-27 DIAGNOSIS — K2970 Gastritis, unspecified, without bleeding: Secondary | ICD-10-CM

## 2018-02-27 DIAGNOSIS — K3189 Other diseases of stomach and duodenum: Secondary | ICD-10-CM | POA: Diagnosis not present

## 2018-02-27 MED ORDER — OMEPRAZOLE 40 MG PO CPDR: 40.0000 mg | DELAYED_RELEASE_CAPSULE | Freq: Every day | ORAL | 11 refills | Status: DC

## 2018-02-27 MED ORDER — SODIUM CHLORIDE 0.9 % IV SOLN: 500.0000 mL | Freq: Once | INTRAVENOUS | Status: DC

## 2018-02-27 NOTE — Progress Notes (Signed)
Called to room to assist during endoscopic procedure.  Patient ID and intended procedure confirmed with present staff. Received instructions for my participation in the procedure from the performing physician.  

## 2018-02-27 NOTE — Progress Notes (Signed)
To PACU, VSS. Report to RN.tb 

## 2018-02-27 NOTE — Op Note (Signed)
[place] Patient Name: Christopher Wall Procedure Date: 02/27/2018 9:15 AM MRN: [id number] Endoscopist: Milus Banister , MD Age: 72 Referring MD:  Date of Birth: February 02, 1946 Gender: Male Account #: [id number] Procedure:                Upper GI endoscopy Indications:              Unexplained chest pain, improved after 2 week                            nexium course. Two brothers with esophageal cancer Medicines:                Monitored Anesthesia Care Procedure:                Pre-Anesthesia Assessment:                           - Prior to the procedure, a History and Physical                            was performed, and patient medications and                            allergies were reviewed. The patient's tolerance of                            previous anesthesia was also reviewed. The risks                            and benefits of the procedure and the sedation                            options and risks were discussed with the patient.                            All questions were answered, and informed consent                            was obtained. Prior Anticoagulants: The patient has                            taken no previous anticoagulant or antiplatelet                            agents. ASA Grade Assessment: II - A patient with                            mild systemic disease. After reviewing the risks                            and benefits, the patient was deemed in                            satisfactory condition to undergo the procedure.  After obtaining informed consent, the endoscope was                            passed under direct vision. Throughout the                            procedure, the patient's blood pressure, pulse, and                            oxygen saturations were monitored continuously. The                            Model GIF-[id number]) scope was introduced                            through  the mouth, and advanced to the second part                            of duodenum. The upper GI endoscopy was                            accomplished without difficulty. The patient                            tolerated the procedure well. 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 No sign of cancer.                           Mild inflammation characterized by erosions,                            erythema and friability was found in the gastric                            antrum. Biopsies were taken with a cold forceps for                            histology.                           The exam was otherwise without abnormality. Complications:            No immediate complications. Estimated blood loss:                            None. Estimated Blood Loss:     Estimated blood loss: none. 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                           -  Gastritis, biopsied.                           - The examination was otherwise normal. Recommendation:           - Patient has a contact number available for                            emergencies. The signs and symptoms of potential                            delayed complications were discussed with the                            patient. Return to normal activities tomorrow.                            Written discharge instructions were provided to the                            patient.                           - Resume previous diet.                           - New prescription today for omeprazole 40mg  pill,                            one pill once daily for acid related damage to your                            esophagus. disp 30 with 11 refills.                           - Await pathology results. Will treat with                            antibiotics if + for H. pylori. Milus Banister,  MD 02/27/2018 9:29:45 AM This report has been signed electronically.

## 2018-02-27 NOTE — Patient Instructions (Addendum)
*   Handout on esophagitis and gastritis given* New Omeprazole prescription sent to your pharmacy  YOU HAD AN ENDOSCOPIC PROCEDURE TODAY AT [facility]:   Refer to the procedure report that was given to you for any specific questions about what was found during the examination.  If the procedure report does not answer your questions, please call your gastroenterologist to clarify.  If you requested that your care partner not be given the details of your procedure findings, then the procedure report has been included in a sealed envelope for you to review at your convenience later.  YOU SHOULD EXPECT: Some feelings of bloating in the abdomen. Passage of more gas than usual.  Walking can help get rid of the air that was put into your GI tract during the procedure and reduce the bloating. If you had a lower endoscopy (such as a colonoscopy or flexible sigmoidoscopy) you may notice spotting of blood in your stool or on the toilet paper. If you underwent a bowel prep for your procedure, you may not have a normal bowel movement for a few days.  Please Note:  You might notice some irritation and congestion in your nose or some drainage.  This is from the oxygen used during your procedure.  There is no need for concern and it should clear up in a day or so.  SYMPTOMS TO REPORT IMMEDIATELY:   Following upper endoscopy (EGD)  Vomiting of blood or coffee ground material  New chest pain or pain under the shoulder blades  Painful or persistently difficult swallowing  New shortness of breath  Fever of 100F or higher  Black, tarry-looking stools  For urgent or emergent issues, a gastroenterologist can be reached at any hour by calling [phone].   DIET:  We do recommend a small meal at first, but then you may proceed to your regular diet.  Drink plenty of fluids but you should avoid alcoholic beverages for 24 hours.  ACTIVITY:  You should plan to take it easy for the rest of today and  you should NOT DRIVE or use heavy machinery until tomorrow (because of the sedation medicines used during the test).    FOLLOW UP: Our staff will call the number listed on your records the next business day following your procedure to check on you and address any questions or concerns that you may have regarding the information given to you following your procedure. If we do not reach you, we will leave a message.  However, if you are feeling well and you are not experiencing any problems, there is no need to return our call.  We will assume that you have returned to your regular daily activities without incident.  If any biopsies were taken you will be contacted by phone or by letter within the next 1-3 weeks.  Please call us at [phone] if you have not heard about the biopsies in 3 weeks.    SIGNATURES/CONFIDENTIALITY: You and/or your care partner have signed paperwork which will be entered into your electronic medical record.  These signatures attest to the fact that that the information above on your After Visit Summary has been reviewed and is understood.  Full responsibility of the confidentiality of this discharge information lies with you and/or your care-partner.

## 2018-03-02 ENCOUNTER — Telehealth: Payer: Self-pay

## 2018-03-02 NOTE — Telephone Encounter (Signed)
  Follow up Call-  Call back number 02/27/2018  Post procedure Call Back phone  # [phone]  Permission to leave phone message Yes  Some recent data might be hidden     Patient questions:  Do you have a fever, pain , or abdominal swelling? No. Pain Score  0 *  Have you tolerated food without any problems? Yes.    Have you been able to return to your normal activities? Yes.    Do you have any questions about your discharge instructions: Diet   No. Medications  No. Follow up visit  No.  Do you have questions or concerns about your Care? No.  Actions: * If pain score is 4 or above: No action needed, pain <4.

## 2018-03-10 ENCOUNTER — Encounter: Payer: Self-pay | Admitting: Gastroenterology

## 2018-03-20 ENCOUNTER — Ambulatory Visit (INDEPENDENT_AMBULATORY_CARE_PROVIDER_SITE_OTHER): Payer: Medicare HMO | Admitting: Family Medicine

## 2018-03-20 ENCOUNTER — Other Ambulatory Visit: Payer: Self-pay

## 2018-03-20 ENCOUNTER — Encounter: Payer: Self-pay | Admitting: Family Medicine

## 2018-03-20 VITALS — BP 120/72 | HR 72 | Temp 98.2°F | Ht 69.0 in | Wt 194.4 lb

## 2018-03-20 DIAGNOSIS — K439 Ventral hernia without obstruction or gangrene: Secondary | ICD-10-CM | POA: Insufficient documentation

## 2018-03-20 HISTORY — DX: Ventral hernia without obstruction or gangrene: K43.9

## 2018-03-20 NOTE — Assessment & Plan Note (Signed)
Refer to general surgery for further evaluation & management. Reviewed signs of incarceration & emergency precautions with patient.

## 2018-03-20 NOTE — Progress Notes (Signed)
Date of Visit: 03/20/2018   HPI:  Patient presents to discuss possible hernia of left flank.  First noticed pain in low back on left side, but has begun radiating around to the front. Was looking at himself in the mirror and noticed a bulge that comes and goes with straining. Pain is constantly present, though never greater than 5/10. Worse with straining. Bowels are moving well, taking colace daily. Had recent upper endoscopy due to having two brothers with esophageal cancer, EGD was normal. The discomfort bothers him enough that he would consider a surgical procedure to correct it. He would like a referral to a Education officer, environmental.  Has upcoming appointment for physical with PCP.  ROS: See HPI.  [place]: history of hyperlipidemia, hearing loss, prediabetes, hypertension, GERD  PHYSICAL EXAM: BP 120/72   Pulse 72   Temp 98.2 F (36.8 C) (Oral)   Ht 5\' 9"  (1.753 m)   Wt 194 lb 6.4 oz (88.2 kg)   SpO2 96%   BMI 28.71 kg/m  Gen: no acute distress, pleasant, cooperative HEENT: normocephalic, atraumatic Lungs: normal work of breathing Abdomen: soft, nontender to palpation. Area of protrusion visible on left lateral abdominal wall with straining. No signs of incarceration Neuro: alert, grossly nonfocal, speehc normal  ASSESSMENT/PLAN:  Health maintenance:  -has upcoming appointment with PCP for physical/health maintenance  Abdominal wall hernia Refer to general surgery for further evaluation & management. Reviewed signs of incarceration & emergency precautions with patient.   FOLLOW UP: Referring to general surgery Keep upcoming physical appointment with PCP.  [place]. Ardelia Mems, [place]

## 2018-03-20 NOTE — Patient Instructions (Signed)
It was nice to meet you today!  Referring to general surgeon Someone will call with an appointment  Be well, Dr. Ardelia Mems

## 2018-04-02 ENCOUNTER — Ambulatory Visit: Payer: Medicare HMO | Admitting: Family Medicine

## 2018-04-02 ENCOUNTER — Encounter: Payer: Self-pay | Admitting: Family Medicine

## 2018-04-02 ENCOUNTER — Other Ambulatory Visit: Payer: Self-pay

## 2018-04-02 VITALS — BP 142/82 | HR 90 | Temp 98.1°F | Ht 69.0 in | Wt 194.0 lb

## 2018-04-02 DIAGNOSIS — R413 Other amnesia: Secondary | ICD-10-CM | POA: Diagnosis not present

## 2018-04-02 DIAGNOSIS — I10 Essential (primary) hypertension: Secondary | ICD-10-CM | POA: Diagnosis not present

## 2018-04-02 DIAGNOSIS — E7800 Pure hypercholesterolemia, unspecified: Secondary | ICD-10-CM

## 2018-04-02 DIAGNOSIS — R7303 Prediabetes: Secondary | ICD-10-CM | POA: Diagnosis not present

## 2018-04-02 DIAGNOSIS — Z23 Encounter for immunization: Secondary | ICD-10-CM

## 2018-04-02 MED ORDER — TETANUS-DIPHTH-ACELL PERTUSSIS 5-2.5-18.5 LF-MCG/0.5 IM SUSP: 0.5000 mL | Freq: Once | INTRAMUSCULAR | 0 refills | Status: AC

## 2018-04-02 MED ORDER — ATORVASTATIN CALCIUM 40 MG PO TABS: 40.0000 mg | ORAL_TABLET | Freq: Every day | ORAL | 99 refills | Status: DC

## 2018-04-02 NOTE — Patient Instructions (Signed)
Your blood pressure is adequately controlled.  Continue the Lisinopril/HCTZ. You are in the Prediabetes state which places yopu at increased risk of developing diabetes. Loss of 5% body weight is associated with signifcant decreased risk of diabetes.  Also, the addition of moderate exercise for 20 minutes a day is also preventative.   You risk of a cardiovascular event in the next ten years is approximately 22%.  This can reduced significantly be the addition of a statin medication, like atorvastatin.    Get you tetanus booster at your pharmacy.  We will set up an appointment with Neruropsychology for follow up testing.   If you have difficulty getting the general surgery appointment, please contact our office to let me  Know.   We are measuring your Direct LDL cholesterol today.   Please look for the results and my comments on MyChart.

## 2018-04-03 ENCOUNTER — Encounter: Payer: Self-pay | Admitting: Family Medicine

## 2018-04-03 LAB — LDL CHOLESTEROL, DIRECT: LDL Direct: 167 mg/dL — ABNORMAL HIGH (ref 0–99)

## 2018-04-03 NOTE — Assessment & Plan Note (Signed)
Established problem Patient concerned about inattention and possible memory decline.  He is requesting referral for repeat neuropsychological testing.  He states he would pay out of pocket for the evaluation if insurance did not cover the evaluation.  Referral made to Dr Halina Andreas ([place]) at [facility] for evaluation of his cognitive concerns.

## 2018-04-03 NOTE — Progress Notes (Signed)
   Subjective:    Patient ID: Christopher Wall, male    DOB: 1945-12-07, 72 y.o.   MRN: [id number] Christopher Wall is alone Sources of clinical information for visit is/are patient and past medical records. Nursing assessment for this office visit was reviewed with the patient for accuracy and revision.  Previous Report(s) Reviewed: lab reports and office notes  Depression screen PHQ 2/9 04/02/2018  Decreased Interest 0  Down, Depressed, Hopeless 0  PHQ - 2 Score 0   Fall Risk  04/02/2018 03/20/2018 12/05/2017 06/08/2015 02/18/2014  Falls in the past year? No No No No No    HPI  HYPERTENSION Disease Monitoring: getting SBP < 140 Chest pain, palpitations- no      Dyspnea- no Medications: Compliance- yes Lightheadedness,Syncope- no   Edema- no  Prediabetes Disease Monitoring: Blood Sugar ranges-not checking at home Polyuria/phagia/dipsia- no      Visual problems- no Works on diet.  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   HYPERLIPIDEMIA Disease Monitoring: See symptoms for Hypertension Medications: Not taking atorvastatin  Abdominal Bulge - Evaluated 03/20/18 at [facility].   - Patient reports tenderness over bulge.  No acute pain. Bulge still present when he bears down.  - He has not yet heard back form [facility] to schedule initial consultation.  Memory Concern - Concern that he is having significant attention deficit that is leading to incompletion of tasks - Treated with methylphenidate in past which improved his performance but increased his BP so he stopped.  - Requesting to return to Neuropsychology for repeat testing.  He has had two testings in past that did not show excessive memory loss or significant cognitive impairment.    Monitoring Labs and Parameters Last A1C:  Lab Results  Component Value Date   HGBA1C 5.8 12/05/2017    Last Lipid:     Component Value Date/Time   CHOL 243 (H) 12/05/2017 1222   HDL 47 12/05/2017 1222   LDLDIRECT 167 (H)  04/02/2018 1007    Last Bmet  Potassium  Date Value Ref Range Status  06/08/2015 4.1 3.5 - 5.3 mmol/L Final   Sodium  Date Value Ref Range Status  06/08/2015 142 135 - 146 mmol/L Final   Creat  Date Value Ref Range Status  06/08/2015 0.99 0.70 - 1.25 mg/dL Final      Last BPs:  BP Readings from Last 3 Encounters:  04/02/18 (!) 142/82  03/20/18 120/72  02/27/18 117/68    SH: Smoking status reviewed  ROS: See HPI    Review of Systems     Objective:   Physical Exam VS reviewed GEN: Alert, Cooperative, Groomed, NAD HEENT: PERRL; EAC bilaterally not occluded, TM's translucent with normal LM, (+) LR;                No cervical LAN, No thyromegaly, No palpable masses COR: RRR, No M/G/R, No JVD, Normal PMI size and location LUNGS: BCTA, No Acc mm use, speaking in full sentences ABDOMEN: (+)BS, soft, NT, ND, No HSM, Palpable fullness left lateral anterior abdomen, slightly tender, no overlying erythema.   EXT: No peripheral leg edema. Feet without deformity or lesions. Palpable bilateral pedal pulses.  Gait: Normal speed, No significant path deviation, Step through +,  Psych: Normal affect/thought/speech/language    Assessment & Plan:  Visit Problem List with A/P  No problem-specific Assessment & Plan notes found for this encounter.

## 2018-04-03 NOTE — Assessment & Plan Note (Signed)
Established problem Recurrent classification for patient Has lost 6 pounds  Recommendation of 5% weight loss and increase aerobi activity to 20 minutes a day. He declined dietary counseling referral.

## 2018-04-03 NOTE — Assessment & Plan Note (Signed)
ACC calculation 10-yr risk of CV event of 22% with recommendation to consider Statin therapy of moderate-to-high potency Plan: Atorvastatin 40 mg daily with goal of 50% reduction LDL (current 167)

## 2018-04-03 NOTE — Assessment & Plan Note (Signed)
Adequate blood pressure control.  No evidence of new end organ damage.  Tolerating medication without significant adverse effects.  Plan to continue current blood pressure regiment.   

## 2018-04-13 ENCOUNTER — Encounter: Payer: Self-pay | Admitting: Psychology

## 2018-05-18 ENCOUNTER — Other Ambulatory Visit: Payer: Self-pay | Admitting: Surgery

## 2018-05-18 DIAGNOSIS — R1032 Left lower quadrant pain: Secondary | ICD-10-CM | POA: Diagnosis not present

## 2018-05-22 ENCOUNTER — Ambulatory Visit
Admission: RE | Admit: 2018-05-22 | Discharge: 2018-05-22 | Disposition: A | Discharge status: Home / Self Care | Payer: Medicare HMO | Source: Ambulatory Visit | Attending: Surgery | Admitting: Surgery

## 2018-05-22 DIAGNOSIS — K4090 Unilateral inguinal hernia, without obstruction or gangrene, not specified as recurrent: Secondary | ICD-10-CM | POA: Diagnosis not present

## 2018-05-22 DIAGNOSIS — R1032 Left lower quadrant pain: Secondary | ICD-10-CM

## 2018-05-22 MED ORDER — IOPAMIDOL (ISOVUE-300) INJECTION 61%
100.0000 mL | Freq: Once | INTRAVENOUS | Status: AC | PRN
  Administered 2018-05-22: 100 mL via INTRAVENOUS

## 2018-05-26 ENCOUNTER — Telehealth: Payer: Self-pay

## 2018-05-26 NOTE — Telephone Encounter (Signed)
-----   Message from Milus Banister, MD sent at 05/26/2018  7:37 AM EDT ----- No worries.  I read your office note.    We'll get him in to discuss colonoscopy. Thanks   Bryannah Boston, He needs OV with me or extender in the next 2-3 weeks to discuss LLQ pain, change in bowels, consider colonoscopy.  Thanks  dj  ----- Message ----- From: Donnie Mesa, MD Sent: 05/25/2018  11:11 AM EDT To: Milus Banister, MD  Sorry about that.  We should be faxing my office note as well.

## 2018-05-26 NOTE — Telephone Encounter (Signed)
appt made for 06/15/18 830 am with Alonza Bogus, PA the appt info sent to the pt My Chart

## 2018-06-15 ENCOUNTER — Encounter: Payer: Self-pay | Admitting: Gastroenterology

## 2018-06-15 ENCOUNTER — Ambulatory Visit: Payer: Medicare HMO | Admitting: Gastroenterology

## 2018-06-15 VITALS — BP 138/72 | HR 74 | Ht 69.0 in | Wt 196.0 lb

## 2018-06-15 DIAGNOSIS — R194 Change in bowel habit: Secondary | ICD-10-CM | POA: Diagnosis not present

## 2018-06-15 DIAGNOSIS — R109 Unspecified abdominal pain: Secondary | ICD-10-CM

## 2018-06-15 DIAGNOSIS — R195 Other fecal abnormalities: Secondary | ICD-10-CM

## 2018-06-15 HISTORY — DX: Unspecified abdominal pain: R10.9

## 2018-06-15 MED ORDER — PEG 3350-KCL-NA BICARB-NACL 420 G PO SOLR: 4000.0000 mL | Freq: Once | ORAL | 0 refills | Status: AC

## 2018-06-15 NOTE — Patient Instructions (Signed)
If you are age 72 or older, your body mass index should be between 23-30. Your Body mass index is 28.94 kg/m. If this is out of the aforementioned range listed, please consider follow up with your Primary Care Provider.  If you are age 77 or younger, your body mass index should be between 19-25. Your Body mass index is 28.94 kg/m. If this is out of the aformentioned range listed, please consider follow up with your Primary Care Provider.   You have been scheduled for a colonoscopy. Please follow written instructions given to you at your visit today.  Please pick up your prep supplies at the pharmacy within the next 1-3 days. If you use inhalers (even only as needed), please bring them with you on the day of your procedure. Your physician has requested that you go to www.startemmi.com and enter the access code given to you at your visit today. This web site gives a general overview about your procedure. However, you should still follow specific instructions given to you by our office regarding your preparation for the procedure.  We have sent the following medications to your pharmacy for you to pick up at your convenience: Golytely  Start 2 tsp. Of Benefiber or Citrucel powder in 8 ounces of liquid daily.  Thank you for choosing me and [facility].   Alonza Bogus, PA-C

## 2018-06-15 NOTE — Progress Notes (Signed)
06/15/2018 Christopher Wall [id number] [birth year]   HISTORY OF PRESENT ILLNESS: This is a pleasant 72 year old male who is a patient of Dr. Ardis Hughs.  He was seen by me earlier this year in April for complaints of reflux related issues including atypical chest pain and concerns for the fact that he had 2 brothers with esophageal cancer.  He had EGD and was found to have acid related changes in the esophagus.  His PPI was increased to omeprazole 40 mg twice daily and he has been doing well in that regards.  Anyway, he is here today at the request of Dr. Georgette Dover from [facility] surgery for evaluation in regards to left-sided abdominal pain and change in bowel habits with loose stools.  He tells me that in July he noticed as he was looking in the mirror that he had a bulge sticking out on his left flank area.  He pushed on the area and it "popped back in".  He has not had any further bulge being noticed in the area, but it has remained sore and tender to the touch.  He says just low-grade discomfort about a 2-2-1/2 on the pain scale.  Also about the same time he started noticing some more frequent looser stools.  Says he is having about 3-4 bowel movements a day.  Denies seeing any blood in his stools.  His last colonoscopy was then June 2015 at which time he was found to have one tubular adenoma that was removed.  Repeat colonoscopy was recommended in 5 years from that time.   Past Medical History:  Diagnosis Date  . 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 Cataract    bilateral cataracts with lens implant  . COLON POLYP 10/30/2006   Qualifier: Diagnosis of  By: Drucie Ip    . Encounter for neuropsychological testing 06/26/2015   [facility]: Only B/L avg in rote memory auditory info encoding. No mood dysfnc  . Encounter for neuropsychological testing 2008   Vivien Rossetti (Neuropsy): Mixed anxiety and depression  o/w normal.   . GASTROESOPHAGEAL REFLUX, NO ESOPHAGITIS 10/30/2006   Qualifier: History of  By: McDiarmid MD, Sherren Mocha    . 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 HYPERLIPIDEMIA 10/30/2006   Qualifier: Diagnosis of  By: Drucie Ip    . Hypertension 04/24/2009   Qualifier: Diagnosis of  By: McDiarmid MD, Sherren Mocha    . Overweight (BMI 25.0-29.9) 10/30/2006   Qualifier: History of  By: McDiarmid MD, Sherren Mocha    . Prediabetes 12/21/2013  . Pulmonary nodule, left 06/09/2015  . Pure hypercholesterolemia 10/30/2006   ACC calculation 10-yr risk of CV event of 9.5% with recommendation to consider Statin therapy of moderate-to-high potency (12/21/13)     Past Surgical History:  Procedure Laterality Date  . CATARACT EXTRACTION W/ INTRAOCULAR LENS  IMPLANT, BILATERAL    . COCCYGECTOMY  1985  . VASECTOMY      reports that he has never smoked. He has never used smokeless tobacco. He reports that he drinks about 3.0 standard drinks of alcohol per week. He reports that he does not use drugs. family history includes Dementia (age of onset: 24) in his mother; Diabetes in his brother and father; Esophageal cancer in his brother and brother; Hypertension in his mother; Stroke (age of onset: 36) in his mother. Allergies  Allergen Reactions  . Cephalosporins Rash      Outpatient Encounter Medications  as of 06/15/2018  Medication Sig  . atorvastatin (LIPITOR) 40 MG tablet Take 1 tablet (40 mg total) by mouth daily.  Marland Kitchen lisinopril-hydrochlorothiazide (PRINZIDE,ZESTORETIC) 10-12.5 MG tablet TAKE ONE TABLET BY MOUTH ONCE DAILY  . omeprazole (PRILOSEC) 40 MG capsule Take 1 capsule (40 mg total) by mouth daily.   No facility-administered encounter medications on file as of 06/15/2018.      REVIEW OF SYSTEMS  : All other systems reviewed and negative except where noted in the History of Present Illness.   PHYSICAL EXAM: BP 138/72   Pulse 74   Ht 5\' 9"  (1.753 m)   Wt 196 lb (88.9 kg)   BMI 28.94 kg/m  General:  Well developed white male in no acute distress Head: Normocephalic and atraumatic Eyes:  Sclerae anicteric, conjunctiva pink. Ears: Normal auditory acuity Lungs: Clear throughout to auscultation; no increased WOB. Heart: Regular rate and rhythm; no M/R/G. Abdomen: Soft, non-distended.  BS present.  Left upper flank TTP. Rectal:  Will be done at the time of colonoscopy. Musculoskeletal: Symmetrical with no gross deformities  Skin: No lesions on visible extremities Extremities: No edema  Neurological: Alert oriented x 4, grossly non-focal Psychological:  Alert and cooperative. Normal mood and affect  ASSESSMENT AND PLAN: *Change in bowel habits with more frequent and looser stools for the past several months.  No blood.  Last colonoscopy 01/2014 with one tubular adenoma removed.  Will schedule for repeat colonoscopy with Dr. Ardis Hughs.  In the meantime I have asked him to try a daily powder fiber supplement such as Benefiber or Citrucel daily to help bulk stools. *Left flank pain:  Also describes that when the looser stools started he noticed a bulge in his left flank that was tender.  Bulge no longer there but still tender.  **The risks, benefits, and alternatives to colonoscopy were discussed with the patient and he consents to proceed.   CC:  McDiarmid, Blane Ohara, MD

## 2018-06-16 ENCOUNTER — Encounter: Payer: Self-pay | Admitting: Gastroenterology

## 2018-06-16 NOTE — Progress Notes (Signed)
I agree with the above note, plan 

## 2018-06-29 ENCOUNTER — Encounter: Payer: Self-pay | Admitting: Gastroenterology

## 2018-06-29 ENCOUNTER — Ambulatory Visit (AMBULATORY_SURGERY_CENTER): Payer: Medicare HMO | Admitting: Gastroenterology

## 2018-06-29 VITALS — BP 113/60 | HR 65 | Temp 98.2°F | Resp 17 | Ht 69.0 in | Wt 196.0 lb

## 2018-06-29 DIAGNOSIS — K5730 Diverticulosis of large intestine without perforation or abscess without bleeding: Secondary | ICD-10-CM | POA: Diagnosis not present

## 2018-06-29 DIAGNOSIS — K219 Gastro-esophageal reflux disease without esophagitis: Secondary | ICD-10-CM | POA: Diagnosis not present

## 2018-06-29 DIAGNOSIS — K6389 Other specified diseases of intestine: Secondary | ICD-10-CM | POA: Diagnosis not present

## 2018-06-29 DIAGNOSIS — R109 Unspecified abdominal pain: Secondary | ICD-10-CM

## 2018-06-29 DIAGNOSIS — R194 Change in bowel habit: Secondary | ICD-10-CM

## 2018-06-29 DIAGNOSIS — I10 Essential (primary) hypertension: Secondary | ICD-10-CM | POA: Diagnosis not present

## 2018-06-29 DIAGNOSIS — R195 Other fecal abnormalities: Secondary | ICD-10-CM

## 2018-06-29 MED ORDER — SODIUM CHLORIDE 0.9 % IV SOLN: 500.0000 mL | Freq: Once | INTRAVENOUS | Status: DC

## 2018-06-29 NOTE — Op Note (Addendum)
[place] Patient Name: Christopher Wall Procedure Date: 06/29/2018 2:18 PM MRN: [id number] Endoscopist: Milus Banister , MD Age: 72 Referring MD:  Date of Birth: November 14, 1945 Gender: Male Account #: [id number] Procedure:                Colonoscopy Indications:              Abdominal pain in the left upper quadrant, Change                            in bowel habits; colonoscopy was then June 2015 at                            which time he was found to have one tubular adenoma                            that was removed. Medicines:                Monitored Anesthesia Care Procedure:                Pre-Anesthesia Assessment:                           - Prior to the procedure, a History and Physical                            was performed, and patient medications and                            allergies were reviewed. The patient's tolerance of                            previous anesthesia was also reviewed. The risks                            and benefits of the procedure and the sedation                            options and risks were discussed with the patient.                            All questions were answered, and informed consent                            was obtained. Prior Anticoagulants: The patient has                            taken no previous anticoagulant or antiplatelet                            agents. ASA Grade Assessment: II - A patient with                            mild systemic disease. After reviewing the risks  and benefits, the patient was deemed in                            satisfactory condition to undergo the procedure.                           After obtaining informed consent, the colonoscope                            was passed under direct vision. Throughout the                            procedure, the patient's blood pressure, pulse, and                            oxygen saturations were monitored  continuously. The                            Colonoscope was introduced through the anus and                            advanced to the the terminal ileum. The colonoscopy                            was performed without difficulty. The patient                            tolerated the procedure well. The quality of the                            bowel preparation was good. The terminal ileum and                            the rectum were photographed. Scope In: 2:26:41 PM Scope Out: 2:47:01 PM Scope Withdrawal Time: 0 hours 17 minutes 21 seconds  Total Procedure Duration: 0 hours 20 minutes 20 seconds  Findings:                 Many small-mouthed diverticula were found in the                            left colon.                           The terminal ileum appeared normal.                           The exam was otherwise without abnormality on                            direct and retroflexion views.                           Biopsies for histology were taken with a cold  forceps from the entire colon for evaluation of                            microscopic colitis. Complications:            No immediate complications. Estimated blood loss:                            None. Estimated Blood Loss:     Estimated blood loss: none. Impression:               - Diverticulosis in the left colon.                           - The examined portion of the ileum was normal.                           - The examination was otherwise normal on direct                            and retroflexion views.                           - Biopsies were taken randomly from the colon to                            check for microscopic colitis. Recommendation:           - Patient has a contact number available for                            emergencies. The signs and symptoms of potential                            delayed complications were discussed with the                             patient. Return to normal activities tomorrow.                            Written discharge instructions were provided to the                            patient.                           - Resume previous diet.                           - Continue present medications.                           - Repeat colonoscopy in 10 years for screening                            purposes. Milus Banister, MD 06/29/2018 2:49:55 PM This report has been signed electronically.

## 2018-06-29 NOTE — Patient Instructions (Signed)
**   Handout given on diverticulosis **   YOU HAD AN ENDOSCOPIC PROCEDURE TODAY AT THE [facility]:   Refer to the procedure report that was given to you for any specific questions about what was found during the examination.  If the procedure report does not answer your questions, please call your gastroenterologist to clarify.  If you requested that your care partner not be given the details of your procedure findings, then the procedure report has been included in a sealed envelope for you to review at your convenience later.  YOU SHOULD EXPECT: Some feelings of bloating in the abdomen. Passage of more gas than usual.  Walking can help get rid of the air that was put into your GI tract during the procedure and reduce the bloating. If you had a lower endoscopy (such as a colonoscopy or flexible sigmoidoscopy) you may notice spotting of blood in your stool or on the toilet paper. If you underwent a bowel prep for your procedure, you may not have a normal bowel movement for a few days.  Please Note:  You might notice some irritation and congestion in your nose or some drainage.  This is from the oxygen used during your procedure.  There is no need for concern and it should clear up in a day or so.  SYMPTOMS TO REPORT IMMEDIATELY:   Following lower endoscopy (colonoscopy or flexible sigmoidoscopy):  Excessive amounts of blood in the stool  Significant tenderness or worsening of abdominal pains  Swelling of the abdomen that is new, acute  Fever of 100F or higher  For urgent or emergent issues, a gastroenterologist can be reached at any hour by calling [phone].   DIET:  We do recommend a small meal at first, but then you may proceed to your regular diet.  Drink plenty of fluids but you should avoid alcoholic beverages for 24 hours.  ACTIVITY:  You should plan to take it easy for the rest of today and you should NOT DRIVE or use heavy machinery until tomorrow (because of the  sedation medicines used during the test).    FOLLOW UP: Our staff will call the number listed on your records the next business day following your procedure to check on you and address any questions or concerns that you may have regarding the information given to you following your procedure. If we do not reach you, we will leave a message.  However, if you are feeling well and you are not experiencing any problems, there is no need to return our call.  We will assume that you have returned to your regular daily activities without incident.  If any biopsies were taken you will be contacted by phone or by letter within the next 1-3 weeks.  Please call us at [phone] if you have not heard about the biopsies in 3 weeks.    SIGNATURES/CONFIDENTIALITY: You and/or your care partner have signed paperwork which will be entered into your electronic medical record.  These signatures attest to the fact that that the information above on your After Visit Summary has been reviewed and is understood.  Full responsibility of the confidentiality of this discharge information lies with you and/or your care-partner. 

## 2018-06-29 NOTE — Progress Notes (Signed)
Called to room to assist during endoscopic procedure.  Patient ID and intended procedure confirmed with present staff. Received instructions for my participation in the procedure from the performing physician.  

## 2018-06-29 NOTE — Progress Notes (Signed)
Report given to PACU, vss 

## 2018-06-30 ENCOUNTER — Encounter: Payer: Self-pay | Admitting: Family Medicine

## 2018-06-30 ENCOUNTER — Telehealth: Payer: Self-pay | Admitting: *Deleted

## 2018-06-30 NOTE — Telephone Encounter (Signed)
  Follow up Call-  Call back number 06/29/2018 02/27/2018  Post procedure Call Back phone  # [phone]  Permission to leave phone message Yes Yes  Some recent data might be hidden     Patient questions:  Do you have a fever, pain , or abdominal swelling? No. Pain Score  0 *  Have you tolerated food without any problems? Yes.    Have you been able to return to your normal activities? Yes.    Do you have any questions about your discharge instructions: Diet   No. Medications  No. Follow up visit  No.  Do you have questions or concerns about your Care? No.  Actions: * If pain score is 4 or above: No action needed, pain <4.

## 2018-07-06 ENCOUNTER — Encounter: Payer: Self-pay | Admitting: Gastroenterology

## 2018-07-09 ENCOUNTER — Encounter: Payer: Self-pay | Admitting: Family Medicine

## 2018-07-09 DIAGNOSIS — K5730 Diverticulosis of large intestine without perforation or abscess without bleeding: Secondary | ICD-10-CM

## 2018-07-09 HISTORY — DX: Diverticulosis of large intestine without perforation or abscess without bleeding: K57.30

## 2018-07-28 ENCOUNTER — Encounter: Payer: Self-pay | Admitting: Family Medicine

## 2018-07-28 NOTE — Progress Notes (Signed)
[employer] notice posisble nonadherence atorvastatin 40 mg dated 07/10/18  "We manage the prescription benefits for the health plan for your patient.  A revied of your patient's retail and mail service prescription history indicates that they may have stopped using their medication" Atorvastatin 40 mg

## 2018-10-19 ENCOUNTER — Encounter

## 2018-10-19 ENCOUNTER — Encounter: Payer: Medicare HMO | Admitting: Psychology

## 2019-05-18 ENCOUNTER — Other Ambulatory Visit: Payer: Self-pay | Admitting: Gastroenterology

## 2019-05-18 DIAGNOSIS — K2100 Gastro-esophageal reflux disease with esophagitis, without bleeding: Secondary | ICD-10-CM

## 2019-05-18 DIAGNOSIS — K2970 Gastritis, unspecified, without bleeding: Secondary | ICD-10-CM

## 2019-05-18 DIAGNOSIS — K2990 Gastroduodenitis, unspecified, without bleeding: Secondary | ICD-10-CM

## 2019-06-27 ENCOUNTER — Other Ambulatory Visit: Payer: Self-pay | Admitting: Gastroenterology

## 2019-06-27 DIAGNOSIS — K2970 Gastritis, unspecified, without bleeding: Secondary | ICD-10-CM

## 2019-06-27 DIAGNOSIS — K2100 Gastro-esophageal reflux disease with esophagitis, without bleeding: Secondary | ICD-10-CM

## 2019-06-27 DIAGNOSIS — K2990 Gastroduodenitis, unspecified, without bleeding: Secondary | ICD-10-CM

## 2019-08-15 ENCOUNTER — Other Ambulatory Visit: Payer: Self-pay | Admitting: Gastroenterology

## 2019-08-15 DIAGNOSIS — K2100 Gastro-esophageal reflux disease with esophagitis, without bleeding: Secondary | ICD-10-CM

## 2019-08-15 DIAGNOSIS — K2970 Gastritis, unspecified, without bleeding: Secondary | ICD-10-CM

## 2019-08-15 DIAGNOSIS — K2990 Gastroduodenitis, unspecified, without bleeding: Secondary | ICD-10-CM

## 2019-09-26 ENCOUNTER — Other Ambulatory Visit: Payer: Self-pay | Admitting: Gastroenterology

## 2019-09-26 DIAGNOSIS — K2970 Gastritis, unspecified, without bleeding: Secondary | ICD-10-CM

## 2019-09-26 DIAGNOSIS — K2100 Gastro-esophageal reflux disease with esophagitis, without bleeding: Secondary | ICD-10-CM

## 2019-09-30 NOTE — Telephone Encounter (Signed)
Patient needs office visit.  

## 2019-11-05 ENCOUNTER — Other Ambulatory Visit: Payer: Self-pay | Admitting: Gastroenterology

## 2019-11-05 DIAGNOSIS — K2100 Gastro-esophageal reflux disease with esophagitis, without bleeding: Secondary | ICD-10-CM

## 2019-11-05 DIAGNOSIS — K2990 Gastroduodenitis, unspecified, without bleeding: Secondary | ICD-10-CM

## 2019-11-05 DIAGNOSIS — K2970 Gastritis, unspecified, without bleeding: Secondary | ICD-10-CM

## 2020-11-02 ENCOUNTER — Encounter: Payer: Self-pay | Admitting: Family Medicine

## 2020-11-02 ENCOUNTER — Other Ambulatory Visit: Payer: Self-pay

## 2020-11-02 ENCOUNTER — Ambulatory Visit (INDEPENDENT_AMBULATORY_CARE_PROVIDER_SITE_OTHER): Payer: Medicare HMO | Admitting: Family Medicine

## 2020-11-02 VITALS — BP 130/60 | HR 82 | Ht 68.0 in | Wt 198.5 lb

## 2020-11-02 DIAGNOSIS — Z9189 Other specified personal risk factors, not elsewhere classified: Secondary | ICD-10-CM | POA: Diagnosis not present

## 2020-11-02 DIAGNOSIS — M25511 Pain in right shoulder: Secondary | ICD-10-CM

## 2020-11-02 DIAGNOSIS — Z79899 Other long term (current) drug therapy: Secondary | ICD-10-CM

## 2020-11-02 DIAGNOSIS — E7800 Pure hypercholesterolemia, unspecified: Secondary | ICD-10-CM | POA: Diagnosis not present

## 2020-11-02 DIAGNOSIS — Z23 Encounter for immunization: Secondary | ICD-10-CM

## 2020-11-02 DIAGNOSIS — S4991XA Unspecified injury of right shoulder and upper arm, initial encounter: Secondary | ICD-10-CM

## 2020-11-02 DIAGNOSIS — E782 Mixed hyperlipidemia: Secondary | ICD-10-CM | POA: Diagnosis not present

## 2020-11-02 DIAGNOSIS — R7303 Prediabetes: Secondary | ICD-10-CM | POA: Diagnosis not present

## 2020-11-02 HISTORY — DX: Unspecified injury of right shoulder and upper arm, initial encounter: S49.91XA

## 2020-11-02 LAB — POCT GLYCOSYLATED HEMOGLOBIN (HGB A1C): Hemoglobin A1C: 5.6 % (ref 4.0–5.6)

## 2020-11-02 NOTE — Assessment & Plan Note (Signed)
Home A1c report of 5.9% Will corroborate with Lab A1c. Patient Obese and family hisotry in first degree relatives with DMT2

## 2020-11-02 NOTE — Assessment & Plan Note (Signed)
Labs draen. No history family nor personal history of cardaic disease.

## 2020-11-02 NOTE — Progress Notes (Unsigned)
Christopher Wall is alone Sources of clinical information for visit is/are patient. Nursing assessment for this office visit was reviewed with the patient for accuracy and revision.   CPT E&M Office Visit Time Before Visit; reviewing medical records (e.g. recent visits, labs, studies): 5 minutes During Visit (F2F time): 20 minutes After Visit (discussion with family or HCP, prescribing, ordering, referring, calling result/recommendations or documenting on same day): 10 minutes Total Visit Time: 35 minutes  Previous Report(s) Reviewed: Epic patient record Depression screen PHQ 2/9 04/02/2018  Decreased Interest 0  Down, Depressed, Hopeless 0  PHQ - 2 Score 0    Fall Risk  11/02/2020 04/02/2018 03/20/2018 12/05/2017 06/08/2015  Falls in the past year? 0 No No No No  Number falls in past yr: 0 - - - -  Injury with Fall? 0 - - - -    PHQ9 SCORE ONLY 04/02/2018 03/20/2018 12/05/2017  PHQ-9 Total Score 0 0 0    Adult vaccines due  Topic Date Due  . TETANUS/TDAP  04/14/2028    Health Maintenance Due  Topic Date Due  . COVID-19 Vaccine (1) Never done      History/P.E. limitations: none  Adult vaccines due  Topic Date Due  . TETANUS/TDAP  04/14/2028      Chief Complaint  Patient presents with  . 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  APAP did not help. Precipitant: Pushed down by his horse walking on icy ground.  Struck his right shoulder. Prior Diagnostic Testing or Treatments: none Relevant PMH/PSH: no prior shoulder injury - no  prior history of significant athrititis.     Right Shoulder AROM  RIGHT                      Abduction  90 deg     Int Rot reach  Unable touch C7     Ext Rot Neutral  Unable to touch T7      Right Shoulder PROM  RIGHT                              Abduction  90 degrees               Right Shoulder: Inspection reveals no abnormalities, atrophy or asymmetry. Palpation with mild tendness over [facility] joint . Rotator cuff strength limited by pain Positive Neer   Negative Hawkin's tests.. Negative Yergason's tests.   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  His loss of sleep from shoulder pain leaves him tired during the day.   Plan Rule out bony injury with DG XR Ddx: Rotator tendonopathy/tear or burstitis Given possibility of tear, referral coordinated to [place] for evaluation and treatment on 11/06/20 at 2pm.. Start topical diclofenac gel to shoulder May use NSAID, ice/heat, RR   Pure hypercholesterolemia Labs  draen. No history family nor personal history of cardaic disease.

## 2020-11-02 NOTE — Assessment & Plan Note (Addendum)
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  His loss of sleep from shoulder pain leaves him tired during the day.   Plan Rule out bony injury with DG XR Ddx: Rotator tendonopathy/tear or burstitis Given possibility of tear, referral coordinated to [place] for evaluation and treatment on 11/06/20 at 2pm.. Given frequent use of NSAIDs for this pain, will check BMP Start topical diclofenac gel to shoulder May use NSAID, ice/heat, RR

## 2020-11-02 NOTE — Patient Instructions (Signed)
Rotator Cuff Tear  A rotator cuff tear is a partial or complete tear of the cord-like bands (tendons) that connect muscle to bone in the rotator cuff. The rotator cuff is a group of muscles and tendons that surround the shoulder joint and keep the upper arm bone (humerus) in the shoulder socket. The tear can occur suddenly (acute tear) or can develop over a long period of time (chronic tear). What are the causes? Acute tears may be caused by:  A fall, especially on an outstretched arm.  Lifting very heavy objects with a jerking motion. Chronic tears may be caused by overuse of the muscles. This may happen in sports, physical work, or activities in which your arm repeatedly moves over your head. What increases the risk? This condition is more likely to occur in:  Athletes and workers who frequently use their shoulder or reach over their head. This may include activities such as: ? Tennis. ? Baseball and softball. ? Swimming and rowing. ? Weight lifting. ? Architect work. ? Painting.  People who smoke.  Older people who have arthritis or poor blood supply. These can make the muscles and tendons weaker. What are the signs or symptoms? Symptoms of this condition depend on the type and severity of the injury:  An acute tear may include a sudden tearing feeling, followed by severe pain that goes from your upper shoulder, down your arm, and toward your elbow.  A chronic tear includes a gradual weakness and decreased shoulder motion as the pain gets worse. The pain is usually worse at night. Both types may have symptoms such as:  Pain that spreads (radiates) from the shoulder to the upper arm.  Swelling and tenderness in front of the shoulder.  Decreased range of motion.  Pain when: ? Reaching, pulling, or lifting the arm above the head. ? Lowering the arm from above the head.  Not being able to raise your arm out to the side.  Difficulty placing the arm behind your back. How  is this diagnosed? This condition is diagnosed with a medical history and physical exam. Imaging tests may also be done, including:  X-rays.  MRI.  Ultrasound.  CT or MR arthrogram. During this test, a contrast material is injected into your shoulder, and then images are taken. How is this treated? Treatment for this condition depends on the type and severity of the condition. In less severe cases, treatment may include:  Rest. This may be done with a sling that holds the shoulder still (immobilization). Your health care provider may also recommend avoiding activities that involve lifting your arm over your head.  Icing the shoulder.  Anti-inflammatory medicines, such as aspirin or ibuprofen.  Strengthening and stretching exercises. Your health care provider may recommend specific exercises to improve your range of motion and strengthen your shoulder. In more severe cases, treatment may include:  Physical therapy.  Steroid injections.  Surgery. Follow these instructions at home: Managing pain, stiffness, and swelling  If directed, put ice on the injured area. To do this: ? If you have a removable sling, remove it as told by your health care provider. ? Put ice in a plastic bag. ? Place a towel between your skin and the bag. ? Leave the ice on for 20 minutes, 2-3 times a day. ? Remove the ice if your skin turns bright red. This is very important. If you cannot feel pain, heat, or cold, you have a greater risk of damage to the area.  Raise (  elevate) the injured area above the level of your heart while you are lying down.  Find a comfortable sleeping position or sleep on a recliner, if available.  Move your fingers often to reduce stiffness and swelling.  Once the swelling has gone down, your health care provider may direct you to apply heat to relax the muscles. Use the heat source that your health care provider recommends, such as a moist heat pack or a heating pad. ? Place  a towel between your skin and the heat source. ? Leave the heat on for 20-30 minutes. ? Remove the heat if your skin turns bright red. This is especially important if you are unable to feel pain, heat, or cold. You have a greater risk of getting burned.      If you have a sling:  Wear the sling as told by your health care provider. Remove it only as told by your health care provider.  Loosen the sling if your fingers tingle, become numb, or turn cold and blue.  Keep the sling clean.  If the sling is not waterproof: ? Do not let it get wet. ? Cover it with a watertight covering when you take a bath or a shower. Activity  Rest your shoulder as told by your health care provider.  Return to your normal activities as told by your health care provider. Ask your health care provider what activities are safe for you.  Ask your health care provider when it is safe to drive if you have a sling on your arm.  Do any exercises or stretches as told by your health care provider. General instructions  Take over-the-counter and prescription medicines only as told by your health care provider.  Do not use any products that contain nicotine or tobacco, such as cigarettes, e-cigarettes, and chewing tobacco. If you need help quitting, ask your health care provider.  Keep all follow-up visits. This is important. Contact a health care provider if:  Your pain gets worse.  You have new pain in your arm, hands, or fingers.  Medicine does not help your pain. Get help right away if:  Your arm, hand, or fingers are numb or tingling.  Your arm, hand, or fingers are swollen or painful or they turn white or blue.  Your hand or fingers on your injured arm are colder than your other hand. Summary  A rotator cuff tear is a partial or complete tear of the cord-like bands (tendons) that connect muscle to bone in the rotator cuff.  The tear can occur suddenly (acute tear) or can develop over a long  period of time (chronic tear).  Treatment generally includes rest, anti-inflammatory medicines, and icing. In some cases, physical therapy and steroid injections may be needed. In severe cases, surgery may be needed. This information is not intended to replace advice given to you by your health care provider. Make sure you discuss any questions you have with your health care provider. Document Revised: 12/22/2019 Document Reviewed: 12/22/2019 Elsevier Patient Education  2021 [employer].

## 2020-11-03 LAB — LDL CHOLESTEROL, DIRECT: LDL Direct: 146 mg/dL — ABNORMAL HIGH (ref 0–99)

## 2020-11-03 LAB — BASIC METABOLIC PANEL
BUN/Creatinine Ratio: 18 (ref 10–24)
BUN: 19 mg/dL (ref 8–27)
CO2: 21 mmol/L (ref 20–29)
Calcium: 9.5 mg/dL (ref 8.6–10.2)
Chloride: 103 mmol/L (ref 96–106)
Creatinine, Ser: 1.06 mg/dL (ref 0.76–1.27)
Glucose: 91 mg/dL (ref 65–99)
Potassium: 4.3 mmol/L (ref 3.5–5.2)
Sodium: 142 mmol/L (ref 134–144)
eGFR: 74 mL/min/{1.73_m2} (ref >59)

## 2020-11-03 LAB — LIPID PANEL
Chol/HDL Ratio: 4.5 ratio (ref 0.0–5.0)
Cholesterol, Total: 241 mg/dL — ABNORMAL HIGH (ref 100–199)
HDL: 54 mg/dL (ref >39)
LDL Chol Calc (NIH): 154 mg/dL — ABNORMAL HIGH (ref 0–99)
Triglycerides: 183 mg/dL — ABNORMAL HIGH (ref 0–149)
VLDL Cholesterol Cal: 33 mg/dL (ref 5–40)

## 2020-11-06 ENCOUNTER — Other Ambulatory Visit: Payer: Self-pay

## 2020-11-06 ENCOUNTER — Ambulatory Visit (INDEPENDENT_AMBULATORY_CARE_PROVIDER_SITE_OTHER): Payer: Medicare HMO | Admitting: Family Medicine

## 2020-11-06 ENCOUNTER — Ambulatory Visit
Admission: RE | Admit: 2020-11-06 | Discharge: 2020-11-06 | Disposition: A | Discharge status: Home / Self Care | Payer: Medicare HMO | Source: Ambulatory Visit | Attending: Family Medicine | Admitting: Family Medicine

## 2020-11-06 ENCOUNTER — Encounter: Payer: Self-pay | Admitting: Family Medicine

## 2020-11-06 ENCOUNTER — Ambulatory Visit: Payer: Medicare HMO | Admitting: Sports Medicine

## 2020-11-06 ENCOUNTER — Ambulatory Visit: Payer: Self-pay

## 2020-11-06 VITALS — BP 136/72 | Ht 68.0 in | Wt 198.0 lb

## 2020-11-06 DIAGNOSIS — M778 Other enthesopathies, not elsewhere classified: Secondary | ICD-10-CM | POA: Insufficient documentation

## 2020-11-06 DIAGNOSIS — M25511 Pain in right shoulder: Secondary | ICD-10-CM

## 2020-11-06 DIAGNOSIS — M25711 Osteophyte, right shoulder: Secondary | ICD-10-CM | POA: Diagnosis not present

## 2020-11-06 MED ORDER — TRIAMCINOLONE ACETONIDE 40 MG/ML IJ SUSP
40.0000 mg | Freq: Once | INTRAMUSCULAR | Status: AC
  Administered 2020-11-06: 40 mg via INTRA_ARTICULAR

## 2020-11-06 NOTE — Progress Notes (Signed)
Christopher Wall - 75 y.o. male MRN [id number]  Date of birth: Dec 14, 1945  SUBJECTIVE:  Including CC & ROS.  No chief complaint on file.   Christopher Wall is a 75 y.o. male that is presenting with right shoulder pain.  He had a fall onto the shoulder about 6 weeks ago.  He is currently having worsening right shoulder pain.  It does refer down to his elbow and hand.  Denies any history of similar pain.  Having trouble with his range of motion.  Has been taking ibuprofen and Aleve with limited improvement.  Independent review of the right shoulder x-ray from earlier today shows no acute changes.  Review of Systems See HPI   HISTORY: Past Medical, Surgical, Social, and Family History Reviewed & Updated per EMR.   Pertinent Historical Findings include:  Past Medical History:  Diagnosis Date  . Abdominal wall hernia 03/20/2018  . 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 Cataract    bilateral cataracts with lens implant  . Chest pain, atypical 12/05/2017  . COLON POLYP 02/07/2014   Tubular Adenoma x 1 - Dr Ardis Hughs (GI)  . Diverticulosis of colon 07/09/2018   Based on colonoscopy 06/2108  . Encounter for neuropsychological testing 06/26/2015   [facility]: Only B/L avg in rote memory auditory info encoding. No mood dysfnc  . Encounter for neuropsychological testing 2008   Vivien Rossetti (Neuropsy): Mixed anxiety and depression o/w normal.   . Family history of esophageal cancer 12/26/2017  . GASTROESOPHAGEAL REFLUX, NO ESOPHAGITIS 10/30/2006   Qualifier: History of  By: McDiarmid MD, Sherren Mocha    . 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 HYPERLIPIDEMIA 10/30/2006   Qualifier: Diagnosis of  By: Drucie Ip    . Hypertension 04/24/2009   Qualifier: Diagnosis of  By: McDiarmid MD, Sherren Mocha    . Left flank pain 06/15/2018  . Memory difficulties 06/09/2015   Prior neuropsychology testing in 2008 by Dr Valentina Shaggy (Neuropsychologist)  was unremarkable other than mixed anxiety/depression.  Repeat testing 06/2015 at [facility]: Only finding was below average in rote memory auditory info encoding. No mood dysfunction.   Marland Kitchen Overweight (BMI 25.0-29.9) 10/30/2006   Qualifier: History of  By: McDiarmid MD, Sherren Mocha    . Prediabetes 12/21/2013  . Pulmonary nodule, left 06/09/2015  . Pure hypercholesterolemia 10/30/2006   ACC calculation 10-yr risk of CV event of 9.5% with recommendation to consider Statin therapy of moderate-to-high potency (12/21/13)      Past Surgical History:  Procedure Laterality Date  . CATARACT EXTRACTION W/ INTRAOCULAR LENS  IMPLANT, BILATERAL    . COCCYGECTOMY  1985  . VASECTOMY      Family History  Problem Relation Age of Onset  . Hypertension Mother   . Stroke Mother 48  . Dementia Mother 26  . Diabetes Father   . Diabetes Brother   . Esophageal cancer Brother   . Lymphoma Brother   . Esophageal cancer Brother   . Lymphoma Brother   . Colon cancer Neg Hx     Social History   Socioeconomic History  . Marital status: Married    Spouse name: Manuela Schwartz  . Number of children: 4  . Years of education: >20  . Highest education level: Not on file  Occupational History  . Occupation: Pharmacist, community  . Occupation: Pharmacist  Tobacco Use  . Smoking status: Never Smoker  . Smokeless tobacco: Never Used  Vaping Use  .  Vaping Use: Never used  Substance and Sexual Activity  . Alcohol use: Yes    Alcohol/week: 3.0 standard drinks    Types: 3 drink(s) per week  . Drug use: No  . Sexual activity: Yes    Comment: monogamous  Other Topics Concern  . 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   4 adult sons ([place], [place], [place]): 3 biological and 1 step-child   Patient is third of six children   No formal HCPOA executed (12/21/13)   No Advanced Directive executed (12/21/13)   He and wife are raising a granddaughter (2016)   Hobbies:  Motorcycling and Horse riding.         Social Determinants of Health   Financial Resource Strain: Not on file  Food Insecurity: Not on file  Transportation Needs: Not on file  Physical Activity: Not on file  Stress: Not on file  Social Connections: Not on file  Intimate Partner Violence: Not on file     PHYSICAL EXAM:  VS: BP 136/72 (BP Location: Left Arm, Patient Position: Sitting, Cuff Size: Normal)   Ht 5\' 8"  (1.727 m)   Wt 198 lb (89.8 kg)   BMI 30.11 kg/m  Physical Exam Gen: NAD, alert, cooperative with exam, well-appearing MSK:  Right shoulder: Limited external rotation. Limited abduction. Normal strength resistance. No empty can test. Normal speeds test. Neurovascular intact  Limited ultrasound: Right shoulder:  Encircling effusion of the biceps tendon. Subscapularis appears to be normal with overlying effusion extending from anterior glenohumeral joint. Degenerative changes appreciated of the supraspinatus.  There is a bursitis that extends down to the proximal humerus. No significant changes in the posterior glenohumeral joint.  Summary: Effusion noted emanating from the glenohumeral joint.  Ultrasound and interpretation by Clearance Coots, MD   Aspiration/Injection Procedure Note Christopher Wall Dec 18, 1945  Procedure: Injection Indications: Right shoulder pain  Procedure Details Consent: Risks of procedure as well as the alternatives and risks of each were explained to the (patient/caregiver).  Consent for procedure obtained. Time Out: Verified patient identification, verified procedure, site/side was marked, verified correct patient position, special equipment/implants available, medications/allergies/relevent history reviewed, required imaging and test results available.  Performed.  The area was cleaned with iodine and alcohol swabs.    The right glenohumeral joint was injected using 4 cc of 1% lidocaine on a 22-gauge 3-1/2 inch needle.  The syringe  was switched to mixture containing 1 cc's of 40 mg Kenalog and 4 cc's of 0.25% bupivacaine was injected.  Ultrasound was used. Images were obtained in long views showing the injection.     A sterile dressing was applied.  Patient did tolerate procedure well.    ASSESSMENT & PLAN:   Capsulitis of shoulder, right Symptoms and exam seem most consistent with capsulitis.  Likely has exacerbation of underlying degenerative change of the labrum as well. -Counseled on home exercise therapy and supportive care. -Injection. -Could consider physical therapy.

## 2020-11-06 NOTE — Assessment & Plan Note (Signed)
Symptoms and exam seem most consistent with capsulitis.  Likely has exacerbation of underlying degenerative change of the labrum as well. -Counseled on home exercise therapy and supportive care. -Injection. -Could consider physical therapy.

## 2020-11-06 NOTE — Patient Instructions (Signed)
Nice to meet you Please try heat before exercises and ice after  Please try the exercise   Please send me a message in MyChart with any questions or updates.  Please see me back in 4 weeks.   --Dr. Raeford Razor

## 2020-12-08 ENCOUNTER — Other Ambulatory Visit: Payer: Self-pay

## 2020-12-08 ENCOUNTER — Ambulatory Visit: Payer: Self-pay

## 2020-12-08 ENCOUNTER — Ambulatory Visit (INDEPENDENT_AMBULATORY_CARE_PROVIDER_SITE_OTHER): Payer: Medicare HMO | Admitting: Family Medicine

## 2020-12-08 ENCOUNTER — Encounter: Payer: Self-pay | Admitting: Family Medicine

## 2020-12-08 VITALS — BP 130/74 | Ht 68.0 in | Wt 198.0 lb

## 2020-12-08 DIAGNOSIS — M75100 Unspecified rotator cuff tear or rupture of unspecified shoulder, not specified as traumatic: Secondary | ICD-10-CM

## 2020-12-08 DIAGNOSIS — M7551 Bursitis of right shoulder: Secondary | ICD-10-CM

## 2020-12-08 HISTORY — DX: Unspecified rotator cuff tear or rupture of unspecified shoulder, not specified as traumatic: M75.100

## 2020-12-08 MED ORDER — TRIAMCINOLONE ACETONIDE 40 MG/ML IJ SUSP
40.0000 mg | Freq: Once | INTRAMUSCULAR | Status: AC
  Administered 2020-12-08: 40 mg via INTRA_ARTICULAR

## 2020-12-08 NOTE — Progress Notes (Signed)
Christopher Wall - 75 y.o. male MRN [id number]  Date of birth: September 13, 1945  SUBJECTIVE:  Including CC & ROS.  No chief complaint on file.   Christopher Wall is a 75 y.o. male that is presenting with acute ongoing right shoulder pain.  He did get some improvement with the previous glenohumeral injection.   Review of Systems See HPI   HISTORY: Past Medical, Surgical, Social, and Family History Reviewed & Updated per EMR.   Pertinent Historical Findings include:  Past Medical History:  Diagnosis Date  . Abdominal wall hernia 03/20/2018  . 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 Cataract    bilateral cataracts with lens implant  . Chest pain, atypical 12/05/2017  . COLON POLYP 02/07/2014   Tubular Adenoma x 1 - Dr Ardis Hughs (GI)  . Diverticulosis of colon 07/09/2018   Based on colonoscopy 06/2108  . Encounter for neuropsychological testing 06/26/2015   [facility]: Only B/L avg in rote memory auditory info encoding. No mood dysfnc  . Encounter for neuropsychological testing 2008   Vivien Rossetti (Neuropsy): Mixed anxiety and depression o/w normal.   . Family history of esophageal cancer 12/26/2017  . GASTROESOPHAGEAL REFLUX, NO ESOPHAGITIS 10/30/2006   Qualifier: History of  By: McDiarmid MD, Sherren Mocha    . 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 HYPERLIPIDEMIA 10/30/2006   Qualifier: Diagnosis of  By: Drucie Ip    . Hypertension 04/24/2009   Qualifier: Diagnosis of  By: McDiarmid MD, Sherren Mocha    . Left flank pain 06/15/2018  . Memory difficulties 06/09/2015   Prior neuropsychology testing in 2008 by Dr Valentina Shaggy (Neuropsychologist) was unremarkable other than mixed anxiety/depression.  Repeat testing 06/2015 at [facility]: Only finding was below average in rote memory auditory info encoding. No mood dysfunction.   Marland Kitchen Overweight (BMI 25.0-29.9) 10/30/2006   Qualifier: History of  By: McDiarmid MD, Sherren Mocha    .  Prediabetes 12/21/2013  . Pulmonary nodule, left 06/09/2015  . Pure hypercholesterolemia 10/30/2006   ACC calculation 10-yr risk of CV event of 9.5% with recommendation to consider Statin therapy of moderate-to-high potency (12/21/13)      Past Surgical History:  Procedure Laterality Date  . CATARACT EXTRACTION W/ INTRAOCULAR LENS  IMPLANT, BILATERAL    . COCCYGECTOMY  1985  . VASECTOMY      Family History  Problem Relation Age of Onset  . Hypertension Mother   . Stroke Mother 48  . Dementia Mother 46  . Diabetes Father   . Diabetes Brother   . Esophageal cancer Brother   . Lymphoma Brother   . Esophageal cancer Brother   . Lymphoma Brother   . Colon cancer Neg Hx     Social History   Socioeconomic History  . Marital status: Married    Spouse name: Manuela Schwartz  . Number of children: 4  . Years of education: >20  . Highest education level: Not on file  Occupational History  . Occupation: Pharmacist, community  . Occupation: Pharmacist  Tobacco Use  . Smoking status: Never Smoker  . Smokeless tobacco: Never Used  Vaping Use  . Vaping Use: Never used  Substance and Sexual Activity  . Alcohol use: Yes    Alcohol/week: 3.0 standard drinks    Types: 3 drink(s) per week  . Drug use: No  . Sexual activity: Yes    Comment: monogamous  Other Topics Concern  . 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   4 adult sons ([place], [place], [place]): 3 biological and 1 step-child   Patient is third of six children   No formal HCPOA executed (12/21/13)   No Advanced Directive executed (12/21/13)   He and wife are raising a granddaughter (2016)   Hobbies: Motorcycling and Horse riding.         Social Determinants of Health   Financial Resource Strain: Not on file  Food Insecurity: Not on file  Transportation Needs: Not on file  Physical Activity: Not on file  Stress: Not on file  Social Connections: Not on file  Intimate Partner Violence: Not on  file     PHYSICAL EXAM:  VS: BP 130/74 (BP Location: Left Arm, Patient Position: Sitting, Cuff Size: Normal)   Ht 5\' 8"  (1.727 m)   Wt 198 lb (89.8 kg)   BMI 30.11 kg/m  Physical Exam Gen: NAD, alert, cooperative with exam, well-appearing MSK:  Right shoulder: Normal range of motion. Positive empty can test. Neurovascular intact   Aspiration/Injection Procedure Note Christopher Wall 1945/12/02  Procedure: Injection Indications: Right shoulder pain  Procedure Details Consent: Risks of procedure as well as the alternatives and risks of each were explained to the (patient/caregiver).  Consent for procedure obtained. Time Out: Verified patient identification, verified procedure, site/side was marked, verified correct patient position, special equipment/implants available, medications/allergies/relevent history reviewed, required imaging and test results available.  Performed.  The area was cleaned with iodine and alcohol swabs.    The right subacromial space was injected using 1 cc's of 40 mg Kenalog and 4 cc's of 0.25% bupivacaine with a 22 1 1/2" needle.  Ultrasound was used. Images were obtained in long views showing the injection.     A sterile dressing was applied.  Patient did tolerate procedure well.     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  - counseled on home exercise therapy and supportive care - injection  - referral to physical therapy  - could consider further imaging.

## 2020-12-08 NOTE — Assessment & Plan Note (Signed)
Seems to have a post traumatic bursitis.  - counseled on home exercise therapy and supportive care - injection  - referral to physical therapy  - could consider further imaging.

## 2020-12-08 NOTE — Patient Instructions (Signed)
Good to see you Please use ice as needed  Please try physical therapy   Please send me a message in MyChart with any questions or updates.  Please see me back in 4 weeks.   --Dr. Raeford Razor

## 2021-01-05 ENCOUNTER — Ambulatory Visit: Payer: Medicare HMO | Admitting: Family Medicine

## 2021-01-08 ENCOUNTER — Ambulatory Visit: Payer: Medicare HMO | Admitting: Family Medicine

## 2021-01-22 ENCOUNTER — Ambulatory Visit: Payer: Medicare HMO | Attending: Family Medicine

## 2021-01-22 ENCOUNTER — Other Ambulatory Visit: Payer: Self-pay

## 2021-01-22 DIAGNOSIS — M6281 Muscle weakness (generalized): Secondary | ICD-10-CM | POA: Diagnosis not present

## 2021-01-22 DIAGNOSIS — M25511 Pain in right shoulder: Secondary | ICD-10-CM | POA: Insufficient documentation

## 2021-01-22 DIAGNOSIS — M25611 Stiffness of right shoulder, not elsewhere classified: Secondary | ICD-10-CM | POA: Diagnosis not present

## 2021-01-22 DIAGNOSIS — G8929 Other chronic pain: Secondary | ICD-10-CM | POA: Insufficient documentation

## 2021-01-22 NOTE — Therapy (Signed)
[place], [place], [address] Phone: [phone]   Fax:  [phone]  Physical Therapy Evaluation  Patient Details  Name: Christopher Wall MRN: [id number] Date of Birth: [birth year] Referring Provider (PT): Rosemarie Ax, MD   Encounter Date: 01/22/2021   PT End of Session - 01/22/21 1345    Visit Number 1    Number of Visits 13    Date for PT Re-Evaluation 03/10/21    Authorization Type [employer] MCR    Progress Note Due on Visit 10    PT Start Time 1330    PT Stop Time 1415    PT Time Calculation (min) 45 min    Activity Tolerance Patient tolerated treatment well    Behavior During Therapy [facility] for tasks assessed/performed           Past Medical History:  Diagnosis Date  . Abdominal wall hernia 03/20/2018  . 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 Cataract    bilateral cataracts with lens implant  . Chest pain, atypical 12/05/2017  . COLON POLYP 02/07/2014   Tubular Adenoma x 1 - Dr Ardis Hughs (GI)  . Diverticulosis of colon 07/09/2018   Based on colonoscopy 06/2108  . Encounter for neuropsychological testing 06/26/2015   [facility]: Only B/L avg in rote memory auditory info encoding. No mood dysfnc  . Encounter for neuropsychological testing 2008   Vivien Rossetti (Neuropsy): Mixed anxiety and depression o/w normal.   . Family history of esophageal cancer 12/26/2017  . GASTROESOPHAGEAL REFLUX, NO ESOPHAGITIS 10/30/2006   Qualifier: History of  By: McDiarmid MD, Sherren Mocha    . 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 HYPERLIPIDEMIA 10/30/2006   Qualifier: Diagnosis of  By: Drucie Ip    . Hypertension 04/24/2009   Qualifier: Diagnosis of  By: McDiarmid MD, Sherren Mocha    . Left flank pain 06/15/2018  . Memory difficulties 06/09/2015   Prior neuropsychology testing in 2008 by Dr Valentina Shaggy (Neuropsychologist) was unremarkable other than  mixed anxiety/depression.  Repeat testing 06/2015 at [facility]: Only finding was below average in rote memory auditory info encoding. No mood dysfunction.   Marland Kitchen Overweight (BMI 25.0-29.9) 10/30/2006   Qualifier: History of  By: McDiarmid MD, Sherren Mocha    . Prediabetes 12/21/2013  . Pulmonary nodule, left 06/09/2015  . Pure hypercholesterolemia 10/30/2006   ACC calculation 10-yr risk of CV event of 9.5% with recommendation to consider Statin therapy of moderate-to-high potency (12/21/13)      Past Surgical History:  Procedure Laterality Date  . CATARACT EXTRACTION W/ INTRAOCULAR LENS  IMPLANT, BILATERAL    . COCCYGECTOMY  1985  . VASECTOMY      There were no vitals filed for this visit.    Subjective Assessment - 01/22/21 1332    Subjective "I had a little run in with the horse in January and got knocked down and slammed my Rt shoulder into the ground. Really limited motion and painful. I went to sports medicine and have had 2 injections. I'm getting joint sounds now and the pain is returning. I have no strength when I try to lift anything." He recalls falling directly onto the lateral Rt shoulder denies hearing any popping/clicking during the fall. He denies and numbness or tingling. He reports hearing a click that is painful when he elevates the arm. He is unable to sleep on the Rt side due to pain.  Pertinent History hearing loss    Limitations --   reaching   Patient Stated Goals "more strength and stability"    Currently in Pain? Yes    Pain Score 2     Pain Location Shoulder    Pain Orientation Right;Lateral    Pain Descriptors / Indicators Pressure    Pain Type Other (Comment)   subacute   Pain Onset More than a month ago    Pain Frequency Constant    Aggravating Factors  reaching    Pain Relieving Factors keepings hands to his side (rest)              [facility] PT Assessment - 01/22/21 0001      Assessment   Medical Diagnosis Subacromial bursitis of right  shoulder joint    Referring Provider (PT) Rosemarie Ax, MD    Onset Date/Surgical Date 09/07/20    Hand Dominance Right    Next MD Visit will schedule after PT    Prior Therapy no      Precautions   Precautions None      Restrictions   Weight Bearing Restrictions No      Balance Screen   Has the patient fallen in the past 6 months Yes    How many times? horse knocked him over    Has the patient had a decrease in activity level because of a fear of falling?  No    Is the patient reluctant to leave their home because of a fear of falling?  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, but when he elevates he has pain)      Cognition   Overall Cognitive Status Within Functional Limits for tasks assessed      Observation/Other Assessments   Focus on Therapeutic Outcomes (FOTO)  48% function to 65% predicted      Coordination   Gross Motor Movements are Fluid and Coordinated Yes      Posture/Postural Control   Posture Comments forward head, rounded shoulders      AROM   Overall AROM Comments Lt shoulder WNL; pain with all Rt shoulder AROM    Right Shoulder Flexion 130 Degrees    Right Shoulder ABduction 130 Degrees    Right Shoulder Internal Rotation 65 Degrees    Right Shoulder External Rotation 60 Degrees      PROM   Overall PROM Comments patient too guarded to accurately assess PROM of the Rt shoulder      Strength   Overall Strength Comments pain with all Rt shoulder MMT (worst pain with ER)    Right Shoulder Flexion 4/5    Right Shoulder ABduction 4+/5    Right Shoulder Internal Rotation 5/5    Right Shoulder External Rotation 4-/5    Left Shoulder Flexion 5/5    Left Shoulder Extension 5/5     Left Shoulder ABduction 5/5    Left Shoulder Internal Rotation 5/5    Left Shoulder External Rotation 5/5    Right Elbow Flexion 5/5    Right Elbow Extension 5/5    Left Elbow Flexion 5/5    Left Elbow Extension 5/5  Palpation   Palpation comment TTP upper trap, supraspinatus, infraspinatus, greater tubercle      Special Tests   Other special tests (+) Empty Can (+) O'briens(-) Biceps Load (+) Neers (+) Michel Bickers (-) Drop Arm (+) Speeds (-) Early Osmond (-) Lift off                      Objective measurements completed on examination: See above findings.       [facility] Adult PT Treatment/Exercise - 01/22/21 0001      Self-Care   Self-Care Other Self-Care Comments    Other Self-Care Comments  see patient education                  PT Education - 01/22/21 1615    Education Details Education on current condition, POC, HEP, FOTO, modalities for pain control.    Person(s) Educated Patient    Methods Explanation;Demonstration;Verbal cues;Handout    Comprehension Verbalized understanding;Returned demonstration            PT Short Term Goals - 01/22/21 1616      PT SHORT TERM GOAL #1   Title Patient will be independent with initial HEP.    Baseline Issued at eval    Time 3    Period Weeks    Status New    Target Date 02/12/21      PT SHORT TERM GOAL #2   Title Patient will demonstrate at least 150 degrees of Rt shoulder flexion and abduction AROM to improve ability to reach overhead.    Baseline see flowsheet    Time 3    Period Weeks    Status New    Target Date 02/12/21      PT SHORT TERM GOAL #3   Title Patient will demonstrate at least 70 degrees of Rt shoulder ER AROM to improve ability to complete self care activities.    Baseline see flowsheet    Time 3    Period Weeks    Status New    Target Date 03/05/21             PT Long Term Goals - 01/22/21 1619      PT LONG TERM GOAL #1   Title Patient will demonstrate 4+/5 Rt  shoulder strength to improve ability to lift and carry objects.    Baseline See flowsheet    Time 6    Period Weeks    Status New    Target Date 03/05/21      PT LONG TERM GOAL #2   Title Patient will score at least 65% function on FOTO to signify clinically meaningful improvement in functional abilities.    Baseline 48% function    Time 6    Period Weeks    Status New    Target Date 03/05/21      PT LONG TERM GOAL #3   Title Patient will demonstrate at least 160 degrees of pain free active shoulder flexion to improve ability to reach buttons in his car.    Baseline see flowsheet    Time 6    Period Weeks    Status New    Target Date 03/05/21                  Plan - 01/22/21 1623    Clinical Impression Statement Patient is a 75 y/o Rt handed male with chief complaint of Rt shoulder pain that began when he fell on his Rt lateral shoulder in  January 2022. He has since received 2 injections with the first injection providing significant pain relief, though not as much relief reported with second injection. His current signs and symptoms are consistent with rotator cuff pathology. He has limited and painful Rt shoulder AROM in all planes with the most pain provoked during rotation and abduction AROM. He has weakness and pain with all MMT, though reports the most pain with ER MMT. Unable to accurately assess PROM as patient remained guarded. He will benefit from skilled PT to assist in restoring his Rt shoulder ROM and strength in order to return to PLOF.    Personal Factors and Comorbidities Age;Time since onset of injury/illness/exacerbation    Examination-Activity Limitations Reach Overhead    Examination-Participation Restrictions Occupation;Meal Prep    Stability/Clinical Decision Making Stable/Uncomplicated    Clinical Decision Making Low    Rehab Potential Good    PT Frequency 2x / week    PT Duration 6 weeks    PT Treatment/Interventions ADLs/Self Care Home  Management;Cryotherapy;Electrical Stimulation;Iontophoresis 4mg /ml Dexamethasone;Moist Heat;Therapeutic activities;Therapeutic exercise;Neuromuscular re-education;Patient/family education;Manual techniques;Passive range of motion;Dry needling;Taping;Vasopneumatic Device    PT Next Visit Plan shoulder isometrics, PROM to tolerance, periscapular strenghtening, AAROM    PT Home Exercise Plan Access Code [facility]    Consulted and Agree with Plan of Care Patient           Patient will benefit from skilled therapeutic intervention in order to improve the following deficits and impairments:  Impaired UE functional use,Pain,Decreased strength,Decreased range of motion,Postural dysfunction  Visit Diagnosis: Chronic right shoulder pain  Stiffness of right shoulder, not elsewhere classified  Muscle weakness (generalized)     Problem List Patient Active Problem List   Diagnosis Date Noted  . Subacromial bursitis of right shoulder joint 12/08/2020  . Capsulitis of shoulder, right 11/06/2020  . Shoulder injury, right, initial encounter 11/02/2020  . Abdominal wall hernia 03/20/2018  . 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 Prediabetes 12/21/2013  . Pure hypercholesterolemia 10/30/2006  . Obesity (BMI 30.0-34.9) 10/30/2006  . GASTROESOPHAGEAL REFLUX, NO ESOPHAGITIS 10/30/2006  Gwendolyn Grant, PT, DPT, ATC 01/22/21 4:34 PM  [facility] [address] [place], [place], [address] Phone: [phone]   Fax:  [phone]  Name: Christopher Wall MRN: [id number] Date of Birth: 16-Feb-1946

## 2021-01-25 ENCOUNTER — Other Ambulatory Visit: Payer: Self-pay

## 2021-01-25 ENCOUNTER — Ambulatory Visit: Payer: Medicare HMO

## 2021-01-25 DIAGNOSIS — M25611 Stiffness of right shoulder, not elsewhere classified: Secondary | ICD-10-CM | POA: Diagnosis not present

## 2021-01-25 DIAGNOSIS — M25511 Pain in right shoulder: Secondary | ICD-10-CM | POA: Diagnosis not present

## 2021-01-25 DIAGNOSIS — M6281 Muscle weakness (generalized): Secondary | ICD-10-CM | POA: Diagnosis not present

## 2021-01-25 DIAGNOSIS — G8929 Other chronic pain: Secondary | ICD-10-CM

## 2021-01-25 NOTE — Therapy (Signed)
[place], [place], [address] Phone: [phone]   Fax:  [phone]  Physical Therapy Treatment  Patient Details  Name: Christopher Wall MRN: [id number] Date of Birth: 06-26-1946 Referring Provider (PT): Rosemarie Ax, MD   Encounter Date: 01/25/2021   PT End of Session - 01/25/21 1622    Visit Number 2    Number of Visits 13    Date for PT Re-Evaluation 03/10/21    Authorization Type Aetna MCR; ionto not covered    Progress Note Due on Visit 10    PT Start Time 1622    PT Stop Time 1700    PT Time Calculation (min) 38 min    Activity Tolerance Patient tolerated treatment well    Behavior During Therapy [facility] for tasks assessed/performed           Past Medical History:  Diagnosis Date  . Abdominal wall hernia 03/20/2018  . 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 Cataract    bilateral cataracts with lens implant  . Chest pain, atypical 12/05/2017  . COLON POLYP 02/07/2014   Tubular Adenoma x 1 - Dr Ardis Hughs (GI)  . Diverticulosis of colon 07/09/2018   Based on colonoscopy 06/2108  . Encounter for neuropsychological testing 06/26/2015   [facility]: Only B/L avg in rote memory auditory info encoding. No mood dysfnc  . Encounter for neuropsychological testing 2008   Vivien Rossetti (Neuropsy): Mixed anxiety and depression o/w normal.   . Family history of esophageal cancer 12/26/2017  . GASTROESOPHAGEAL REFLUX, NO ESOPHAGITIS 10/30/2006   Qualifier: History of  By: McDiarmid MD, Sherren Mocha    . 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 HYPERLIPIDEMIA 10/30/2006   Qualifier: Diagnosis of  By: Drucie Ip    . Hypertension 04/24/2009   Qualifier: Diagnosis of  By: McDiarmid MD, Sherren Mocha    . Left flank pain 06/15/2018  . Memory difficulties 06/09/2015   Prior neuropsychology testing in 2008 by Dr Valentina Shaggy (Neuropsychologist) was  unremarkable other than mixed anxiety/depression.  Repeat testing 06/2015 at [facility]: Only finding was below average in rote memory auditory info encoding. No mood dysfunction.   Marland Kitchen Overweight (BMI 25.0-29.9) 10/30/2006   Qualifier: History of  By: McDiarmid MD, Sherren Mocha    . Prediabetes 12/21/2013  . Pulmonary nodule, left 06/09/2015  . Pure hypercholesterolemia 10/30/2006   ACC calculation 10-yr risk of CV event of 9.5% with recommendation to consider Statin therapy of moderate-to-high potency (12/21/13)      Past Surgical History:  Procedure Laterality Date  . CATARACT EXTRACTION W/ INTRAOCULAR LENS  IMPLANT, BILATERAL    . COCCYGECTOMY  1985  . VASECTOMY      There were no vitals filed for this visit.   Subjective Assessment - 01/25/21 1624    Subjective "It's a little more sore since doing the exercises."    Pertinent History hearing loss    Limitations --   reaching   Patient Stated Goals "more strength and stability"    Currently in Pain? Yes    Pain Score 3     Pain Location Shoulder    Pain Orientation Right;Lateral    Pain Descriptors / Indicators Sore                             [facility] Adult PT Treatment/Exercise - 01/25/21 0001  Shoulder Exercises: Seated   Retraction 10 reps    Flexion 5 reps    Flexion Limitations AAROM with towel    Abduction 5 reps    ABduction Limitations AAROM with towel      Shoulder Exercises: Standing   External Rotation 10 reps    External Rotation Limitations isometric; Rt    Internal Rotation 10 reps    Internal Rotation Limitations isometric; Rt      Manual Therapy   Manual therapy comments Rt GHJ mobilizations all planes grade II-III, STM/DTM posterior rotator cuff                  PT Education - 01/25/21 1705    Education Details Updated HEP. Self soft tissue mobilization with tennis ball.    Person(s) Educated Patient    Methods Explanation;Demonstration;Verbal cues;Handout     Comprehension Verbalized understanding;Returned demonstration;Verbal cues required            PT Short Term Goals - 01/22/21 1616      PT SHORT TERM GOAL #1   Title Patient will be independent with initial HEP.    Baseline Issued at eval    Time 3    Period Weeks    Status New    Target Date 02/12/21      PT SHORT TERM GOAL #2   Title Patient will demonstrate at least 150 degrees of Rt shoulder flexion and abduction AROM to improve ability to reach overhead.    Baseline see flowsheet    Time 3    Period Weeks    Status New    Target Date 02/12/21      PT SHORT TERM GOAL #3   Title Patient will demonstrate at least 70 degrees of Rt shoulder ER AROM to improve ability to complete self care activities.    Baseline see flowsheet    Time 3    Period Weeks    Status New    Target Date 03/05/21             PT Long Term Goals - 01/22/21 1619      PT LONG TERM GOAL #1   Title Patient will demonstrate 4+/5 Rt shoulder strength to improve ability to lift and carry objects.    Baseline See flowsheet    Time 6    Period Weeks    Status New    Target Date 03/05/21      PT LONG TERM GOAL #2   Title Patient will score at least 65% function on FOTO to signify clinically meaningful improvement in functional abilities.    Baseline 48% function    Time 6    Period Weeks    Status New    Target Date 03/05/21      PT LONG TERM GOAL #3   Title Patient will demonstrate at least 160 degrees of pain free active shoulder flexion to improve ability to reach buttons in his car.    Baseline see flowsheet    Time 6    Period Weeks    Status New    Target Date 03/05/21                 Plan - 01/25/21 1652    Clinical Impression Statement Patient noted to have Rt GHJ hypomobility in all planes and has difficulty relaxing with joint mobilizations. Tautness and palpable tenderness present about infraspinatus and supraspinatus with partial release from manual therapy. Issued  patient tennis ball to complete self soft  tissue mobilization. Reviewed HEP with patient requiring postural cues for scapular retraction. Began rotator cuff isometric strengthening without reports of pain, though patient reports fatigue with these exercises. Overall good tolerance to today's session without increased pain reported.    Personal Factors and Comorbidities Age;Time since onset of injury/illness/exacerbation    Examination-Activity Limitations Reach Overhead    Examination-Participation Restrictions Occupation;Meal Prep    Stability/Clinical Decision Making Stable/Uncomplicated    Rehab Potential Good    PT Frequency 2x / week    PT Duration 6 weeks    PT Treatment/Interventions ADLs/Self Care Home Management;Cryotherapy;Electrical Stimulation;Iontophoresis 4mg /ml Dexamethasone;Moist Heat;Therapeutic activities;Therapeutic exercise;Neuromuscular re-education;Patient/family education;Manual techniques;Passive range of motion;Dry needling;Taping;Vasopneumatic Device    PT Next Visit Plan shoulder isometrics, PROM to tolerance, periscapular strenghtening, AAROM    PT Home Exercise Plan Access Code [facility]    Consulted and Agree with Plan of Care Patient           Patient will benefit from skilled therapeutic intervention in order to improve the following deficits and impairments:  Impaired UE functional use,Pain,Decreased strength,Decreased range of motion,Postural dysfunction  Visit Diagnosis: Chronic right shoulder pain  Stiffness of right shoulder, not elsewhere classified  Muscle weakness (generalized)     Problem List Patient Active Problem List   Diagnosis Date Noted  . Subacromial bursitis of right shoulder joint 12/08/2020  . Capsulitis of shoulder, right 11/06/2020  . Shoulder injury, right, initial encounter 11/02/2020  . Abdominal wall hernia 03/20/2018  . 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 Prediabetes 12/21/2013  . Pure hypercholesterolemia 10/30/2006   . Obesity (BMI 30.0-34.9) 10/30/2006  . GASTROESOPHAGEAL REFLUX, NO ESOPHAGITIS 10/30/2006   Gwendolyn Grant, PT, DPT, ATC 01/25/21 5:51 PM  [facility] [address] [place], [place], [address] Phone: [phone]   Fax:  [phone]  Name: HOYTE ZIEBELL MRN: [id number] Date of Birth: [birth year]

## 2021-01-30 ENCOUNTER — Other Ambulatory Visit: Payer: Self-pay

## 2021-01-30 ENCOUNTER — Ambulatory Visit: Payer: Medicare HMO

## 2021-01-30 DIAGNOSIS — M6281 Muscle weakness (generalized): Secondary | ICD-10-CM

## 2021-01-30 DIAGNOSIS — M25511 Pain in right shoulder: Secondary | ICD-10-CM | POA: Diagnosis not present

## 2021-01-30 DIAGNOSIS — G8929 Other chronic pain: Secondary | ICD-10-CM | POA: Diagnosis not present

## 2021-01-30 DIAGNOSIS — M25611 Stiffness of right shoulder, not elsewhere classified: Secondary | ICD-10-CM

## 2021-02-01 ENCOUNTER — Ambulatory Visit: Payer: Medicare HMO | Attending: Family Medicine

## 2021-02-01 ENCOUNTER — Other Ambulatory Visit: Payer: Self-pay

## 2021-02-01 DIAGNOSIS — M25611 Stiffness of right shoulder, not elsewhere classified: Secondary | ICD-10-CM | POA: Insufficient documentation

## 2021-02-01 DIAGNOSIS — G8929 Other chronic pain: Secondary | ICD-10-CM

## 2021-02-01 DIAGNOSIS — M6281 Muscle weakness (generalized): Secondary | ICD-10-CM | POA: Diagnosis not present

## 2021-02-01 DIAGNOSIS — M25511 Pain in right shoulder: Secondary | ICD-10-CM | POA: Diagnosis not present

## 2021-02-01 NOTE — Therapy (Signed)
[place], [place], [address] Phone: [phone]   Fax:  [phone]  Physical Therapy Treatment  Patient Details  Name: Christopher Wall MRN: [id number] Date of Birth: [birth year] Referring Provider (PT): Rosemarie Ax, MD   Encounter Date: 01/30/2021   PT End of Session - 02/01/21 1423    Visit Number 3    Number of Visits 13    Date for PT Re-Evaluation 03/10/21    Authorization Type [employer] MCR; ionto not covered    Progress Note Due on Visit 10    PT Start Time 1750    PT Stop Time 1830    PT Time Calculation (min) 40 min    Activity Tolerance Patient tolerated treatment well    Behavior During Therapy [facility] for tasks assessed/performed           Past Medical History:  Diagnosis Date  . Abdominal wall hernia 03/20/2018  . 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 Cataract    bilateral cataracts with lens implant  . Chest pain, atypical 12/05/2017  . COLON POLYP 02/07/2014   Tubular Adenoma x 1 - Dr Ardis Hughs (GI)  . Diverticulosis of colon 07/09/2018   Based on colonoscopy 06/2108  . Encounter for neuropsychological testing 06/26/2015   [facility]: Only B/L avg in rote memory auditory info encoding. No mood dysfnc  . Encounter for neuropsychological testing 2008   Vivien Rossetti (Neuropsy): Mixed anxiety and depression o/w normal.   . Family history of esophageal cancer 12/26/2017  . GASTROESOPHAGEAL REFLUX, NO ESOPHAGITIS 10/30/2006   Qualifier: History of  By: McDiarmid MD, Sherren Mocha    . 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 HYPERLIPIDEMIA 10/30/2006   Qualifier: Diagnosis of  By: Drucie Ip    . Hypertension 04/24/2009   Qualifier: Diagnosis of  By: McDiarmid MD, Sherren Mocha    . Left flank pain 06/15/2018  . Memory difficulties 06/09/2015   Prior neuropsychology testing in 2008 by Dr Valentina Shaggy (Neuropsychologist) was  unremarkable other than mixed anxiety/depression.  Repeat testing 06/2015 at [facility]: Only finding was below average in rote memory auditory info encoding. No mood dysfunction.   Marland Kitchen Overweight (BMI 25.0-29.9) 10/30/2006   Qualifier: History of  By: McDiarmid MD, Sherren Mocha    . Prediabetes 12/21/2013  . Pulmonary nodule, left 06/09/2015  . Pure hypercholesterolemia 10/30/2006   ACC calculation 10-yr risk of CV event of 9.5% with recommendation to consider Statin therapy of moderate-to-high potency (12/21/13)      Past Surgical History:  Procedure Laterality Date  . CATARACT EXTRACTION W/ INTRAOCULAR LENS  IMPLANT, BILATERAL    . COCCYGECTOMY  1985  . VASECTOMY      There were no vitals filed for this visit.   Subjective Assessment - 02/01/21 1425    Subjective "My shoulder actually hurts more after you do the exercises. I think it's less sore throughout the course of the day than it used to be. It's getting a little easier to operate instruments in his car above shoulder height now."    Pertinent History hearing loss    Limitations --   reaching   Patient Stated Goals "more strength and stability"    Currently in Pain? Yes    Pain Score --   1.5   Pain Location Shoulder    Pain Orientation Right;Lateral;Anterior    Pain Descriptors / Indicators Sore  [facility] PT Treatment/Exercise - 02/01/21 0001      Self-Care   Self-Care Other Self-Care Comments    Other Self-Care Comments  see patient education      Shoulder Exercises: Sidelying   External Rotation Left;15 reps    ABduction 15 reps    ABduction Limitations Manual assist of scapula into upward/downward rotation   to 90, otherwise incr upper trap activation and popping in shoulder (painless)     Manual Therapy   Manual Therapy Myofascial release;Joint mobilization;Passive ROM    Joint Mobilization GHJ posterior glides grade 3    Myofascial Release Subscap release  with dorsum of hand on forehead    Passive ROM shoulder flexion, ER, abd                  PT Education - 02/01/21 1422    Education Details educated on subscapularis preventing glenhomeral ER and flexion, initiated gravity eliminated flexion and abduction    Person(s) Educated Patient    Methods Explanation;Demonstration;Tactile cues;Verbal cues    Comprehension Verbalized understanding;Returned demonstration;Verbal cues required;Tactile cues required            PT Short Term Goals - 01/22/21 1616      PT SHORT TERM GOAL #1   Title Patient will be independent with initial HEP.    Baseline Issued at eval    Time 3    Period Weeks    Status New    Target Date 02/12/21      PT SHORT TERM GOAL #2   Title Patient will demonstrate at least 150 degrees of Rt shoulder flexion and abduction AROM to improve ability to reach overhead.    Baseline see flowsheet    Time 3    Period Weeks    Status New    Target Date 02/12/21      PT SHORT TERM GOAL #3   Title Patient will demonstrate at least 70 degrees of Rt shoulder ER AROM to improve ability to complete self care activities.    Baseline see flowsheet    Time 3    Period Weeks    Status New    Target Date 03/05/21             PT Long Term Goals - 01/22/21 1619      PT LONG TERM GOAL #1   Title Patient will demonstrate 4+/5 Rt shoulder strength to improve ability to lift and carry objects.    Baseline See flowsheet    Time 6    Period Weeks    Status New    Target Date 03/05/21      PT LONG TERM GOAL #2   Title Patient will score at least 65% function on FOTO to signify clinically meaningful improvement in functional abilities.    Baseline 48% function    Time 6    Period Weeks    Status New    Target Date 03/05/21      PT LONG TERM GOAL #3   Title Patient will demonstrate at least 160 degrees of pain free active shoulder flexion to improve ability to reach buttons in his car.    Baseline see flowsheet     Time 6    Period Weeks    Status New    Target Date 03/05/21                 Plan - 02/01/21 1415    Clinical Impression Statement Pt presents with continued weakness in shoulder,  noting some improvement since starting PT. He had a very restricted subscapularis closer to the insertion point that slowly released with XFM yielding significant improvement into supine PROM flexion and ER with no pain. Pt initially externally rotation to ~45 in supine with dorsum of hand on forehead and was able to relax elbow down to pillow by end of myofascial release. Attempted iso ER activation in S/L with 2# regressing 1# and finally body weight only. Pt is deltoid and pec dominant, but was able to fire posterior cuff with cues to squeeze towel and no weight in hand 3x30". Initiated gravity eliminated abduction in S/L, having difficulty performing with 1# DB due to poor control, but able to control bodyweight in neutral position to shoulder. Any further above 90 resulted in shoulder elevation, upper trap compensation and popping.    Personal Factors and Comorbidities Age;Time since onset of injury/illness/exacerbation    Examination-Activity Limitations Reach Overhead    Examination-Participation Restrictions Occupation;Meal Prep    Stability/Clinical Decision Making Stable/Uncomplicated    Rehab Potential Good    PT Frequency 2x / week    PT Duration 6 weeks    PT Treatment/Interventions ADLs/Self Care Home Management;Cryotherapy;Electrical Stimulation;Iontophoresis 4mg /ml Dexamethasone;Moist Heat;Therapeutic activities;Therapeutic exercise;Neuromuscular re-education;Patient/family education;Manual techniques;Passive range of motion;Dry needling;Taping;Vasopneumatic Device    PT Next Visit Plan Assess response to subscap release, shoulder isometrics, PROM to tolerance, periscapular strenghtening, AAROM    PT Home Exercise Plan Access Code [facility]    Consulted and Agree with Plan of Care Patient            Patient will benefit from skilled therapeutic intervention in order to improve the following deficits and impairments:  Impaired UE functional use,Pain,Decreased strength,Decreased range of motion,Postural dysfunction  Visit Diagnosis: Chronic right shoulder pain  Stiffness of right shoulder, not elsewhere classified  Muscle weakness (generalized)     Problem List Patient Active Problem List   Diagnosis Date Noted  . Subacromial bursitis of right shoulder joint 12/08/2020  . Capsulitis of shoulder, right 11/06/2020  . Shoulder injury, right, initial encounter 11/02/2020  . Abdominal wall hernia 03/20/2018  . 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 Prediabetes 12/21/2013  . Pure hypercholesterolemia 10/30/2006  . Obesity (BMI 30.0-34.9) 10/30/2006  . GASTROESOPHAGEAL REFLUX, NO ESOPHAGITIS 10/30/2006    Izell Leigh, PT, DPT 02/01/2021, 2:26 PM  [facility] [address] [place], [place], [address] Phone: [phone]   Fax:  [phone]  Name: KIPPER BUCH MRN: [id number] Date of Birth: Jan 01, 1946

## 2021-02-01 NOTE — Therapy (Signed)
[place], [place], [address] Phone: [phone]   Fax:  [phone]  Physical Therapy Treatment  Patient Details  Name: Christopher Wall MRN: [id number] Date of Birth: [birth year] Referring Provider (PT): Rosemarie Ax, MD   Encounter Date: 02/01/2021   PT End of Session - 02/01/21 1802    Visit Number 4    Number of Visits 13    Date for PT Re-Evaluation 03/10/21    Authorization Type [employer] MCR; ionto not covered    Progress Note Due on Visit 10    PT Start Time 1748    PT Stop Time 1830    PT Time Calculation (min) 42 min    Activity Tolerance Patient tolerated treatment well    Behavior During Therapy [facility] for tasks assessed/performed           Past Medical History:  Diagnosis Date  . Abdominal wall hernia 03/20/2018  . 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 Cataract    bilateral cataracts with lens implant  . Chest pain, atypical 12/05/2017  . COLON POLYP 02/07/2014   Tubular Adenoma x 1 - Dr Ardis Hughs (GI)  . Diverticulosis of colon 07/09/2018   Based on colonoscopy 06/2108  . Encounter for neuropsychological testing 06/26/2015   [facility]: Only B/L avg in rote memory auditory info encoding. No mood dysfnc  . Encounter for neuropsychological testing 2008   Vivien Rossetti (Neuropsy): Mixed anxiety and depression o/w normal.   . Family history of esophageal cancer 12/26/2017  . GASTROESOPHAGEAL REFLUX, NO ESOPHAGITIS 10/30/2006   Qualifier: History of  By: McDiarmid MD, Sherren Mocha    . 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 HYPERLIPIDEMIA 10/30/2006   Qualifier: Diagnosis of  By: Drucie Ip    . Hypertension 04/24/2009   Qualifier: Diagnosis of  By: McDiarmid MD, Sherren Mocha    . Left flank pain 06/15/2018  . Memory difficulties 06/09/2015   Prior neuropsychology testing in 2008 by Dr Valentina Shaggy (Neuropsychologist) was  unremarkable other than mixed anxiety/depression.  Repeat testing 06/2015 at [facility]: Only finding was below average in rote memory auditory info encoding. No mood dysfunction.   Marland Kitchen Overweight (BMI 25.0-29.9) 10/30/2006   Qualifier: History of  By: McDiarmid MD, Sherren Mocha    . Prediabetes 12/21/2013  . Pulmonary nodule, left 06/09/2015  . Pure hypercholesterolemia 10/30/2006   ACC calculation 10-yr risk of CV event of 9.5% with recommendation to consider Statin therapy of moderate-to-high potency (12/21/13)      Past Surgical History:  Procedure Laterality Date  . CATARACT EXTRACTION W/ INTRAOCULAR LENS  IMPLANT, BILATERAL    . COCCYGECTOMY  1985  . VASECTOMY      There were no vitals filed for this visit.   Subjective Assessment - 02/01/21 1752    Subjective "My shoulder feels sore where it was worked on. I can't believe it was so weak."    Pertinent History hearing loss    Limitations --   reaching   Patient Stated Goals "more strength and stability"    Currently in Pain? Yes    Pain Score 2     Pain Location Shoulder    Pain Orientation Right;Lateral;Anterior    Pain Descriptors / Indicators Sore              [facility] PT Assessment - 02/01/21 0001      AROM   Right Shoulder Flexion 142  Degrees    Right Shoulder ABduction 140 Degrees                         [facility] Adult PT Treatment/Exercise - 02/01/21 1814      Self-Care   Self-Care Other Self-Care Comments    Other Self-Care Comments  see patient education      Shoulder Exercises: Supine   Diagonals 10 reps;Right;Strengthening    Theraband Level (Shoulder Diagonals) Level 1 (Yellow)    Diagonals Limitations D2 flexion, some manual guidance      Shoulder Exercises: Sidelying   External Rotation Right   3x30"   External Rotation Limitations towel roll under arm    ABduction 15 reps    ABduction Limitations initial assistance for improved scapulohumeral rhythm then min cueing to reduce  shoulder elevation   no popping noted with ROM below 90     Manual Therapy   Manual Therapy Myofascial release;Joint mobilization;Passive ROM;Other (comment)    Joint Mobilization GHJ posterior glides grade 3    Myofascial Release checked subscap (TTP from last session, so did not perform any release)    Passive ROM shoulder flexion, ER    Other Manual Therapy Rhythmic stabilization 2x1', larger ROM flex/ext and PNF diagonals                  PT Education - 02/01/21 1843    Education Details added to HEP, provided yellow band    Person(s) Educated Patient    Methods Explanation;Demonstration;Tactile cues;Verbal cues;Handout    Comprehension Verbalized understanding;Returned demonstration;Verbal cues required;Tactile cues required            PT Short Term Goals - 01/22/21 1616      PT SHORT TERM GOAL #1   Title Patient will be independent with initial HEP.    Baseline Issued at eval    Time 3    Period Weeks    Status New    Target Date 02/12/21      PT SHORT TERM GOAL #2   Title Patient will demonstrate at least 150 degrees of Rt shoulder flexion and abduction AROM to improve ability to reach overhead.    Baseline see flowsheet    Time 3    Period Weeks    Status New    Target Date 02/12/21      PT SHORT TERM GOAL #3   Title Patient will demonstrate at least 70 degrees of Rt shoulder ER AROM to improve ability to complete self care activities.    Baseline see flowsheet    Time 3    Period Weeks    Status New    Target Date 03/05/21             PT Long Term Goals - 01/22/21 1619      PT LONG TERM GOAL #1   Title Patient will demonstrate 4+/5 Rt shoulder strength to improve ability to lift and carry objects.    Baseline See flowsheet    Time 6    Period Weeks    Status New    Target Date 03/05/21      PT LONG TERM GOAL #2   Title Patient will score at least 65% function on FOTO to signify clinically meaningful improvement in functional abilities.     Baseline 48% function    Time 6    Period Weeks    Status New    Target Date 03/05/21      PT  LONG TERM GOAL #3   Title Patient will demonstrate at least 160 degrees of pain free active shoulder flexion to improve ability to reach buttons in his car.    Baseline see flowsheet    Time 6    Period Weeks    Status New    Target Date 03/05/21                 Plan - 02/01/21 1807    Clinical Impression Statement Pt presents with no change in R shoulder from previous session on 5/31, but does have some soreness from myofascial release of subscapularis. He tolerated treatment well today with no adverse effects. Progress strength in supine and sidelying with PNF diagonals and rhythmic stabilization. Pt was able to activate posterior cuff right away in sidelying ER hold and perform sidelying abduction with only min tactile cueing today. Provided yellow theraband for home for supine PNF D2 flexion, and added sidelying strength to HEP.    Personal Factors and Comorbidities Age;Time since onset of injury/illness/exacerbation    Examination-Activity Limitations Reach Overhead    Examination-Participation Restrictions Occupation;Meal Prep    Stability/Clinical Decision Making Stable/Uncomplicated    Rehab Potential Good    PT Frequency 2x / week    PT Duration 6 weeks    PT Treatment/Interventions ADLs/Self Care Home Management;Cryotherapy;Electrical Stimulation;Iontophoresis 4mg /ml Dexamethasone;Moist Heat;Therapeutic activities;Therapeutic exercise;Neuromuscular re-education;Patient/family education;Manual techniques;Passive range of motion;Dry needling;Taping;Vasopneumatic Device    PT Next Visit Plan Shoulder isometrics, PROM to tolerance, periscapular strengthening, AAROM, scapulohumeral rhythm    PT Home Exercise Plan Access Code [email or web address]    Consulted and Agree with Plan of Care Patient           Patient will benefit from skilled therapeutic intervention in order to improve the  following deficits and impairments:  Impaired UE functional use,Pain,Decreased strength,Decreased range of motion,Postural dysfunction  Visit Diagnosis: Chronic right shoulder pain  Stiffness of right shoulder, not elsewhere classified  Muscle weakness (generalized)     Problem List Patient Active Problem List   Diagnosis Date Noted  . Subacromial bursitis of right shoulder joint 12/08/2020  . Capsulitis of shoulder, right 11/06/2020  . Shoulder injury, right, initial encounter 11/02/2020  . Abdominal wall hernia 03/20/2018  . 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 Prediabetes 12/21/2013  . Pure hypercholesterolemia 10/30/2006  . Obesity (BMI 30.0-34.9) 10/30/2006  . GASTROESOPHAGEAL REFLUX, NO ESOPHAGITIS 10/30/2006    Izell [place], PT, DPT 02/01/2021, 6:44 PM  [facility] [address] [place], [place], [address] Phone: [phone]   Fax:  [phone]  Name: Christopher Wall MRN: [id number] Date of Birth: [birth year]

## 2021-02-05 ENCOUNTER — Other Ambulatory Visit: Payer: Self-pay

## 2021-02-05 ENCOUNTER — Ambulatory Visit: Payer: Medicare HMO

## 2021-02-05 DIAGNOSIS — M6281 Muscle weakness (generalized): Secondary | ICD-10-CM | POA: Diagnosis not present

## 2021-02-05 DIAGNOSIS — G8929 Other chronic pain: Secondary | ICD-10-CM | POA: Diagnosis not present

## 2021-02-05 DIAGNOSIS — M25511 Pain in right shoulder: Secondary | ICD-10-CM

## 2021-02-05 DIAGNOSIS — M25611 Stiffness of right shoulder, not elsewhere classified: Secondary | ICD-10-CM | POA: Diagnosis not present

## 2021-02-05 NOTE — Therapy (Signed)
[place], [place], [address] Phone: [phone]   Fax:  [phone]  Physical Therapy Treatment  Patient Details  Name: Christopher Wall MRN: [id number] Date of Birth: 15-Sep-1945 Referring Provider (PT): Rosemarie Ax, MD   Encounter Date: 02/05/2021   PT End of Session - 02/05/21 1145    Visit Number 5    Number of Visits 13    Date for PT Re-Evaluation 03/10/21    Authorization Type [employer] MCR; ionto not covered    Progress Note Due on Visit 10    PT Start Time 9563    PT Stop Time 1240   game ready for 10 minutes   PT Time Calculation (min) 55 min    Activity Tolerance Patient tolerated treatment well    Behavior During Therapy [facility] for tasks assessed/performed           Past Medical History:  Diagnosis Date  . Abdominal Wall hernia 03/20/2018  . 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 Cataract    bilateral cataracts with lens implant  . Chest pain, atypical 12/05/2017  . COLON POLYP 02/07/2014   Tubular Adenoma x 1 - Dr Ardis Hughs (GI)  . Diverticulosis of colon 07/09/2018   Based on colonoscopy 06/2108  . Encounter for neuropsychological testing 06/26/2015   [facility]: Only B/L avg in rote memory auditory info encoding. No mood dysfnc  . Encounter for neuropsychological testing 2008   Vivien Rossetti (Neuropsy): Mixed anxiety and depression o/w normal.   . Family history of esophageal cancer 12/26/2017  . GASTROESOPHAGEAL REFLUX, NO ESOPHAGITIS 10/30/2006   Qualifier: History of  By: McDiarmid MD, Sherren Mocha    . 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 HYPERLIPIDEMIA 10/30/2006   Qualifier: Diagnosis of  By: Drucie Ip    . Hypertension 04/24/2009   Qualifier: Diagnosis of  By: McDiarmid MD, Sherren Mocha    . Left flank pain 06/15/2018  . Memory difficulties 06/09/2015   Prior neuropsychology testing in 2008 by Dr Valentina Shaggy  (Neuropsychologist) was unremarkable other than mixed anxiety/depression.  Repeat testing 06/2015 at [facility]: Only finding was below average in rote memory auditory info encoding. No mood dysfunction.   Marland Kitchen Overweight (BMI 25.0-29.9) 10/30/2006   Qualifier: History of  By: McDiarmid MD, Sherren Mocha    . Prediabetes 12/21/2013  . Pulmonary nodule, left 06/09/2015  . Pure hypercholesterolemia 10/30/2006   ACC calculation 10-yr risk of CV event of 9.5% with recommendation to consider Statin therapy of moderate-to-high potency (12/21/13)      Past Surgical History:  Procedure Laterality Date  . CATARACT EXTRACTION W/ INTRAOCULAR LENS  IMPLANT, BILATERAL    . COCCYGECTOMY  1985  . VASECTOMY      There were no vitals filed for this visit.   Subjective Assessment - 02/05/21 1147    Subjective "I feel like my ROM is increased, but it hurt so bad doing my exercises yesterday I had to stop with all the cracking/popping."    Pertinent History hearing loss    Limitations --   reaching   Patient Stated Goals "more strength and stability"    Currently in Pain? Yes    Pain Score 2     Pain Location Shoulder    Pain Orientation Right;Lateral    Pain Descriptors / Indicators Pressure    Pain Type Other (Comment)   subacute   Pain Onset More than a  month ago    Pain Frequency Constant              [facility] PT Assessment - 02/05/21 0001      Special Tests   Other special tests (+) O'brien's (+) Jerk (-) Anterior Slide (-) Biceps Load                         [facility] Adult PT Treatment/Exercise - 02/05/21 0001      Self-Care   Other Self-Care Comments  see patient education      Shoulder Exercises: Prone   Retraction 15 reps    Retraction Limitations x2      Shoulder Exercises: Sidelying   External Rotation Right    External Rotation Weight (lbs) 1    External Rotation Limitations 1 x 5 no weight; 2 x 5 1#    Flexion 10 reps;Right    Flexion Limitations x2;     ABduction Limitations attempted pain      Shoulder Exercises: Stretch   Other Shoulder Stretches sleeper stretch 3 x 30 sec    Other Shoulder Stretches doorway ER stretch 3  x30 sec      Vasopneumatic   Number Minutes Vasopneumatic  10 minutes    Vasopnuematic Location  Shoulder    Vasopneumatic Pressure Medium    Vasopneumatic Temperature  39 degrees                  PT Education - 02/05/21 1323    Education Details Education on differential diagnosis and role of PT in treatment. Updated HEP.    Person(s) Educated Patient    Methods Explanation;Demonstration;Verbal cues;Handout    Comprehension Verbalized understanding;Returned demonstration;Verbal cues required            PT Short Term Goals - 02/01/21 1845      PT SHORT TERM GOAL #1   Title Patient will be independent with initial HEP.    Baseline Issued at eval    Time 3    Period Weeks    Status Achieved    Target Date 02/12/21      PT SHORT TERM GOAL #2   Title Patient will demonstrate at least 150 degrees of Rt shoulder flexion and abduction AROM to improve ability to reach overhead.    Baseline flexion 142, abduction 140    Time 3    Period Weeks    Status On-going    Target Date 02/12/21      PT SHORT TERM GOAL #3   Title Patient will demonstrate at least 70 degrees of Rt shoulder ER AROM to improve ability to complete self care activities.    Baseline NT    Time 3    Period Weeks    Status On-going    Target Date 03/05/21             PT Long Term Goals - 01/22/21 1619      PT LONG TERM GOAL #1   Title Patient will demonstrate 4+/5 Rt shoulder strength to improve ability to lift and carry objects.    Baseline See flowsheet    Time 6    Period Weeks    Status New    Target Date 03/05/21      PT LONG TERM GOAL #2   Title Patient will score at least 65% function on FOTO to signify clinically meaningful improvement in functional abilities.    Baseline 48% function    Time 6  Period  Weeks    Status New    Target Date 03/05/21      PT LONG TERM GOAL #3   Title Patient will demonstrate at least 160 degrees of pain free active shoulder flexion to improve ability to reach buttons in his car.    Baseline see flowsheet    Time 6    Period Weeks    Status New    Target Date 03/05/21                 Plan - 02/05/21 1145    Clinical Impression Statement Patient arrives reporting increased pain with HEP since last session with painful popping/clicking that occurs mostly with resisted supine PNF D2 flexion. Upon further assessment today patient has positive O'brien's and Jerk special tests indicative of potential labral pathology. He was able to tolerate minimal repetitions of isolated rotator cuff strengthening prior to reports of increased pain. No pain with sidelying flexion, though patient reports that he quickly fatigues. Unable to perform sidleying abduction due to immediate pain with activation and audible popping. Finished session with GameReady with patient reporting a pain reduction at end of session.    Personal Factors and Comorbidities Age;Time since onset of injury/illness/exacerbation    Examination-Activity Limitations Reach Overhead    Examination-Participation Restrictions Occupation;Meal Prep    Stability/Clinical Decision Making Stable/Uncomplicated    Rehab Potential Good    PT Frequency 2x / week    PT Duration 6 weeks    PT Treatment/Interventions ADLs/Self Care Home Management;Cryotherapy;Electrical Stimulation;Iontophoresis 4mg /ml Dexamethasone;Moist Heat;Therapeutic activities;Therapeutic exercise;Neuromuscular re-education;Patient/family education;Manual techniques;Passive range of motion;Dry needling;Taping;Vasopneumatic Device    PT Next Visit Plan Shoulder isometrics, PROM to tolerance, periscapular strengthening, AAROM, scapulohumeral rhythm    PT Home Exercise Plan Access Code [email or web address]    Consulted and Agree with Plan of Care Patient            Patient will benefit from skilled therapeutic intervention in order to improve the following deficits and impairments:  Impaired UE functional use,Pain,Decreased strength,Decreased range of motion,Postural dysfunction  Visit Diagnosis: Chronic right shoulder pain  Stiffness of right shoulder, not elsewhere classified  Muscle weakness (generalized)     Problem List Patient Active Problem List   Diagnosis Date Noted  . Subacromial bursitis of right shoulder joint 12/08/2020  . Capsulitis of shoulder, right 11/06/2020  . Shoulder injury, right, initial encounter 11/02/2020  . Abdominal Wall hernia 03/20/2018  . 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 Prediabetes 12/21/2013  . Pure hypercholesterolemia 10/30/2006  . Obesity (BMI 30.0-34.9) 10/30/2006  . GASTROESOPHAGEAL REFLUX, NO ESOPHAGITIS 10/30/2006  Gwendolyn Grant, PT, DPT, ATC 02/05/21 1:36 PM  [facility] [address] [place], [place], [address] Phone: [phone]   Fax:  [phone]  Name: Christopher Wall MRN: [id number] Date of Birth: 02-May-1946

## 2021-02-09 ENCOUNTER — Ambulatory Visit: Payer: Medicare HMO | Admitting: Physical Therapy

## 2021-02-16 ENCOUNTER — Ambulatory Visit: Payer: Medicare HMO

## 2021-02-16 ENCOUNTER — Other Ambulatory Visit: Payer: Self-pay

## 2021-02-16 DIAGNOSIS — G8929 Other chronic pain: Secondary | ICD-10-CM | POA: Diagnosis not present

## 2021-02-16 DIAGNOSIS — M6281 Muscle weakness (generalized): Secondary | ICD-10-CM | POA: Diagnosis not present

## 2021-02-16 DIAGNOSIS — M25611 Stiffness of right shoulder, not elsewhere classified: Secondary | ICD-10-CM | POA: Diagnosis not present

## 2021-02-16 DIAGNOSIS — M25511 Pain in right shoulder: Secondary | ICD-10-CM | POA: Diagnosis not present

## 2021-02-16 NOTE — Patient Instructions (Signed)
Access Code: [email or web address] URL: [email or web address] Date: 02/16/2021 Prepared by: Kathreen Cornfield  Exercises Seated Scapular Retraction - 2 x daily - 7 x weekly - 3 sets - 10 reps Seated Shoulder Flexion Towel Slide at Table Top - 2 x daily - 7 x weekly - 1 sets - 10 reps - 5 sec hold Seated Shoulder Abduction Towel Slide at Table Top - 2 x daily - 7 x weekly - 1 sets - 10 reps - 5 sec hold Standing Isometric Shoulder External Rotation with Doorway - 1 x daily - 7 x weekly - 2 sets - 10 reps Standing Isometric Shoulder Internal Rotation at Doorway - 1 x daily - 7 x weekly - 2 sets - 10 reps Sidelying Shoulder External Rotation with Dumbbell - 3-4 x weekly - 3 sets - 30 seconds hold Sidelying Shoulder Abduction Palm Forward - 3-4 x weekly - 2 sets - 8-12 reps Sidelying Shoulder Flexion 15 Degrees - 1 x daily - 7 x weekly - 2 sets - 10 reps Standing Shoulder External Rotation Stretch in Doorway - 1 x daily - 7 x weekly - 2 sets - 10 reps Side Lying Open Book - 1-2 x daily - 7 x weekly - 1-2 sets - 10-15 reps Thoracic Extension Mobilization on Foam Roll - 1-2 x daily - 7 x weekly - 2-3 sets - 10 reps

## 2021-02-16 NOTE — Therapy (Signed)
[place], [place], [address] Phone: [phone]   Fax:  [phone]  Physical Therapy Treatment  Patient Details  Name: Christopher Wall MRN: [id number] Date of Birth: [birth year] Referring Provider (PT): Rosemarie Ax, MD   Encounter Date: 02/16/2021   PT End of Session - 02/16/21 0957     Visit Number 6    Number of Visits 13    Date for PT Re-Evaluation 03/10/21    Authorization Type [employer] MCR; ionto not covered    Progress Note Due on Visit 10    PT Start Time 1000    PT Stop Time 1055   game ready for 10 minutes   PT Time Calculation (min) 55 min    Activity Tolerance Patient tolerated treatment well    Behavior During Therapy [facility] for tasks assessed/performed             Past Medical History:  Diagnosis Date   Abdominal wall hernia 03/20/2018   ATTENTION DEFICIT, 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 No mood dysfnc   Encounter for neuropsychological testing 2008   Vivien Rossetti (Neuropsy): Mixed anxiety and depression o/w normal.    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  Repeat testing 06/2015 at [facility]: Only finding was below average in rote memory auditory info encoding. No mood dysfunction.    Overweight (BMI 25.0-29.9) 10/30/2006   Qualifier: History of  By: McDiarmid MD, Todd     Prediabetes 12/21/2013   Pulmonary nodule, left 06/09/2015   Pure hypercholesterolemia 10/30/2006   ACC calculation 10-yr risk of CV event of 9.5% with recommendation to consider Statin therapy of moderate-to-high potency (12/21/13)      Past Surgical History:  Procedure Laterality Date   CATARACT EXTRACTION W/ INTRAOCULAR LENS  IMPLANT, BILATERAL     COCCYGECTOMY  1985   VASECTOMY      There were no vitals filed for this visit.   Subjective Assessment - 02/16/21 0958     Subjective "Good news and bad news: Joint is stronger and steadier, but more clicking and popping and increase in pain with any activity. I've had to back off on exercises a couple of times because it gets uncomfortable. I've noticed an improvement in bieng able to stabilize instruments at work, but it is more uncomfortable".    Pertinent History hearing loss    Limitations --   reaching   Patient Stated Goals "more strength and stability"    Currently in Pain? Yes    Pain Score 3  Pain Location Shoulder    Pain Orientation Right;Lateral;Anterior    Pain Descriptors / Indicators Pressure    Pain Onset More than a month ago                               [facility] Adult PT Treatment/Exercise - 02/16/21 0001       Shoulder Exercises: Supine   Other Supine Exercises Supine FR T/S 2x12   pink roller     Shoulder Exercises: Sidelying   External Rotation Right    External Rotation Limitations 1x30" no weight; 2x30" 1#   towel roll squeeze   Flexion 10 reps;Right    Flexion Limitations x2;    Other Sidelying Exercises Thoracic rotation with hand behind head L, on chest R (due to discomfort)  and lumbar block x 15 B      Shoulder Exercises: Standing   Theraband Level (Shoulder Row) Level 3 (Green)    Row Limitations iso 3x12" with handles above elbows      Vasopneumatic   Number Minutes Vasopneumatic  10 minutes    Vasopnuematic Location  Shoulder    Vasopneumatic Pressure Medium    Vasopneumatic Temperature  40 degrees      Manual Therapy   Manual Therapy Joint mobilization;Soft tissue mobilization;Myofascial release    Manual therapy comments prone    Joint Mobilization PAs to thoracic spine    Soft tissue mobilization STM/DTM to R periscapular mm and posterior cuff    Myofascial Release XFM posterior cuff                    PT Education - 02/16/21 1052     Education Details added thoracic extension with FR (pt to look for one at [employer]) and sidelying open books to HEP, provided handout; educated on scheduling with sports med MD after 7/1 to f/u    Person(s) Educated Patient    Methods Explanation;Demonstration;Tactile cues;Verbal cues;Handout    Comprehension Verbalized understanding;Returned demonstration;Verbal cues required;Tactile cues required              PT Short Term Goals - 02/01/21 1845       PT SHORT TERM GOAL #1   Title Patient will be independent with initial HEP.    Baseline Issued at eval    Time 3    Period Weeks    Status Achieved    Target Date 02/12/21      PT SHORT TERM GOAL #2   Title Patient will demonstrate at least 150 degrees of Rt shoulder flexion and abduction AROM to improve ability to reach overhead.    Baseline flexion 142, abduction 140    Time 3    Period Weeks    Status On-going    Target Date 02/12/21      PT SHORT TERM GOAL #3   Title Patient will demonstrate at least 70 degrees of Rt shoulder ER AROM to improve ability to complete self care activities.    Baseline NT    Time 3    Period Weeks    Status On-going    Target Date 03/05/21               PT Long Term Goals - 01/22/21 1619        PT LONG TERM GOAL #1   Title Patient will demonstrate 4+/5 Rt shoulder strength to improve ability to lift and carry objects.    Baseline See flowsheet  Time 6    Period Weeks    Status New    Target Date 03/05/21      PT LONG TERM GOAL #2   Title Patient will score at least 65% function on FOTO to signify clinically meaningful improvement in functional abilities.    Baseline 48% function    Time 6    Period Weeks    Status New    Target Date 03/05/21      PT LONG TERM GOAL #3   Title Patient will demonstrate at least 160 degrees of pain free active shoulder flexion to improve ability to reach buttons in his car.    Baseline see flowsheet    Time 6    Period Weeks    Status New    Target Date 03/05/21                   Plan - 02/16/21 0958     Clinical Impression Statement Pt arrives reporting cont'd higher pain levels, but improved ability to perform his job and improved joint stability. He tolerated treatment well today with no adverse effects. He reported the "normal discomfort" with sidelying flexion and needed tactile cues to prevent too much protraction and increased pain. Hrequired mod tactile cueing and positioning for sidelying iso ER with no pain once anterior shoulder relaxed with posterior cuff activation and towel roll squeeze, and mod-max cueing to perform rows properly. Finished session with Game Ready ice to R shoulder to decrease post session soreness.    Personal Factors and Comorbidities Age;Time since onset of injury/illness/exacerbation    Examination-Activity Limitations Reach Overhead    Examination-Participation Restrictions Occupation;Meal Prep    Stability/Clinical Decision Making Stable/Uncomplicated    Rehab Potential Good    PT Frequency 2x / week    PT Duration 6 weeks    PT Treatment/Interventions ADLs/Self Care Home Management;Cryotherapy;Electrical Stimulation;Iontophoresis 4mg /ml Dexamethasone;Moist Heat;Therapeutic  activities;Therapeutic exercise;Neuromuscular re-education;Patient/family education;Manual techniques;Passive range of motion;Dry needling;Taping;Vasopneumatic Device    PT Next Visit Plan Shoulder isometrics, PROM to tolerance, periscapular strengthening, AAROM, scapulohumeral rhythm, Game Ready as needed    PT Home Exercise Plan Access Code [facility]    Consulted and Agree with Plan of Care Patient             Patient will benefit from skilled therapeutic intervention in order to improve the following deficits and impairments:  Impaired UE functional use, Pain, Decreased strength, Decreased range of motion, Postural dysfunction  Visit Diagnosis: Chronic right shoulder pain  Stiffness of right shoulder, not elsewhere classified  Muscle weakness (generalized)     Problem List Patient Active Problem List   Diagnosis Date Noted   Subacromial bursitis of right shoulder joint 12/08/2020   Capsulitis of shoulder, right 11/06/2020   Shoulder injury, right, initial encounter 11/02/2020   Abdominal wall hernia 03/20/2018   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

## 2021-02-19 ENCOUNTER — Other Ambulatory Visit: Payer: Self-pay

## 2021-02-19 ENCOUNTER — Ambulatory Visit: Payer: Medicare HMO

## 2021-02-19 DIAGNOSIS — G8929 Other chronic pain: Secondary | ICD-10-CM | POA: Diagnosis not present

## 2021-02-19 DIAGNOSIS — M6281 Muscle weakness (generalized): Secondary | ICD-10-CM

## 2021-02-19 DIAGNOSIS — M25611 Stiffness of right shoulder, not elsewhere classified: Secondary | ICD-10-CM

## 2021-02-19 DIAGNOSIS — M25511 Pain in right shoulder: Secondary | ICD-10-CM | POA: Diagnosis not present

## 2021-02-19 NOTE — Therapy (Signed)
[place], [place], [address] Phone: [phone]   Fax:  [phone]  Physical Therapy Treatment  Patient Details  Name: Christopher Wall MRN: [id number] Date of Birth: Jun 24, 1946 Referring Provider (PT): Rosemarie Ax, MD   Encounter Date: 02/19/2021   PT End of Session - 02/19/21 0848     Visit Number 7    Number of Visits 13    Date for PT Re-Evaluation 03/10/21    Authorization Type [employer] MCR; ionto not covered    Progress Note Due on Visit 10    PT Start Time 0848    PT Stop Time 0940   gameready 10 minutes at end of session   PT Time Calculation (min) 52 min    Activity Tolerance Patient tolerated treatment well    Behavior During Therapy [facility] for tasks assessed/performed             Past Medical History:  Diagnosis Date   Abdominal wall hernia 03/20/2018   ATTENTION DEFICIT, 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 No mood dysfnc   Encounter for neuropsychological testing 2008   Vivien Rossetti (Neuropsy): Mixed anxiety and depression o/w normal.    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  Repeat testing 06/2015 at [facility]: Only finding was below average in rote memory auditory info encoding. No mood dysfunction.    Overweight (BMI 25.0-29.9) 10/30/2006   Qualifier: History of  By: McDiarmid MD, Todd     Prediabetes 12/21/2013   Pulmonary nodule, left 06/09/2015   Pure hypercholesterolemia 10/30/2006   ACC calculation 10-yr risk of CV event of 9.5% with recommendation to consider Statin therapy of moderate-to-high potency (12/21/13)      Past Surgical History:  Procedure Laterality Date   CATARACT EXTRACTION W/ INTRAOCULAR LENS  IMPLANT, BILATERAL     COCCYGECTOMY  1985   VASECTOMY      There were no vitals filed for this visit.   Subjective Assessment - 02/19/21 0850     Subjective "It's stronger and steadier, but the noises are more." He reports constant pressure rated as 2/10. He reports some of his HEP is painful (sidelying ER)    Pertinent History hearing loss    Limitations --   reaching   Patient Stated Goals "more strength and stability"    Currently in Pain? Yes    Pain Score 2     Pain Location Shoulder    Pain Orientation Right    Pain Descriptors / Indicators Pressure    Pain Onset More than a month  ago    Pain Frequency Constant                [facility] PT Assessment - 02/19/21 0001       AROM   Right Shoulder External Rotation 51 Degrees                           [facility] Adult PT Treatment/Exercise - 02/19/21 0001       Self-Care   Other Self-Care Comments  see patient education      Shoulder Exercises: Supine   External Rotation 10 reps    External Rotation Limitations AAROM Rt      Shoulder Exercises: Prone   Other Prone Exercises single arm row Rt 2 x 12; 4#    Other Prone Exercises prone T 2 x 10; Rt      Shoulder Exercises: Standing   External Rotation 10 reps    Theraband Level (Shoulder External Rotation) Level 2 (Red)     External Rotation Limitations reactive isometric x2; Rt    Internal Rotation 10 reps    Theraband Level (Shoulder Internal Rotation) Level 2 (Red)    Internal Rotation Limitations reactive isometric x2 Rt    Other Standing Exercises horizontal adduction Rt yellow 2 x 10      Vasopneumatic   Number Minutes Vasopneumatic  10 minutes    Vasopnuematic Location  Shoulder    Vasopneumatic Pressure Low    Vasopneumatic Temperature  38 degrees                    PT Education - 02/19/21 0914     Education Details Updated HEP.    Person(s) Educated Patient    Methods Explanation;Demonstration;Verbal cues;Tactile cues;Handout    Comprehension Verbalized understanding;Returned demonstration;Verbal cues required              PT Short Term Goals - 02/19/21 0859       PT SHORT TERM GOAL #1   Title Patient will be independent with initial HEP.    Baseline Issued at eval    Time 3    Period Weeks    Status Achieved    Target Date 02/12/21      PT SHORT TERM GOAL #2   Title Patient will demonstrate at least 150 degrees of Rt shoulder flexion and abduction AROM to improve ability to reach overhead.    Baseline flexion 142, abduction 140    Time 3    Period Weeks    Status On-going    Target Date 02/12/21      PT SHORT TERM GOAL #3   Title Patient will demonstrate at least 70 degrees of Rt shoulder ER AROM to improve ability to complete self care activities.    Baseline 51    Time 3    Period Weeks    Status On-going    Target Date 03/05/21               PT Long Term Goals - 01/22/21 1619       PT LONG TERM GOAL #1   Title Patient will demonstrate 4+/5 Rt shoulder strength to improve ability to lift and carry objects.    Baseline See flowsheet    Time 6    Period Weeks    Status New    Target Date 03/05/21      PT LONG TERM GOAL #2   Title Patient will score at least 65% function  on FOTO to signify clinically meaningful improvement in functional  abilities.    Baseline 48% function    Time 6    Period Weeks    Status New    Target Date 03/05/21      PT LONG TERM GOAL #3   Title Patient will demonstrate at least 160 degrees of pain free active shoulder flexion to improve ability to reach buttons in his car.    Baseline see flowsheet    Time 6    Period Weeks    Status New    Target Date 03/05/21                   Plan - 02/19/21 0855     Clinical Impression Statement Due to patient complaints of pain with isotonic shoulder ER as part of HEP, completed ER/IR reactive isometrics with patient tolerating isometric rotator cuff strengthening well reporting fatigue towards end of second set, though no popping/clicking or increased pain noted. Educated to discontinue sidelying ER as part of HEP and begin reactive isometrics. Overall good tolerance to today's session with progression of peripscapular strengthening and rotator cuff strengthening reporting mild increase in pain at end range of prone row, otherwise no increased pain reported throughout session. He required intermittent postural cues to decrease forward shoulders with all ther ex. Patient requested to finish with GameReady.    Personal Factors and Comorbidities Age;Time since onset of injury/illness/exacerbation    Examination-Activity Limitations Reach Overhead    Examination-Participation Restrictions Occupation;Meal Prep    Stability/Clinical Decision Making Stable/Uncomplicated    Rehab Potential Good    PT Frequency 2x / week    PT Duration 6 weeks    PT Treatment/Interventions ADLs/Self Care Home Management;Cryotherapy;Electrical Stimulation;Iontophoresis 4mg /ml Dexamethasone;Moist Heat;Therapeutic activities;Therapeutic exercise;Neuromuscular re-education;Patient/family education;Manual techniques;Passive range of motion;Dry needling;Taping;Vasopneumatic Device    PT Next Visit Plan Shoulder isometrics, PROM to tolerance, periscapular strengthening, AAROM,  scapulohumeral rhythm, Game Ready as needed    PT Home Exercise Plan Access Code [facility]    Consulted and Agree with Plan of Care Patient             Patient will benefit from skilled therapeutic intervention in order to improve the following deficits and impairments:  Impaired UE functional use, Pain, Decreased strength, Decreased range of motion, Postural dysfunction  Visit Diagnosis: Chronic right shoulder pain  Stiffness of right shoulder, not elsewhere classified  Muscle weakness (generalized)     Problem List Patient Active Problem List   Diagnosis Date Noted   Subacromial bursitis of right shoulder joint 12/08/2020   Capsulitis of shoulder, right 11/06/2020   Shoulder injury, right, initial encounter 11/02/2020   Abdominal wall hernia 03/20/2018   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 [place], [place], [address] Phone: [phone]   Fax:  [phone]  Name: Christopher Wall MRN: [id number] Date of Birth: 30-Dec-1945

## 2021-02-23 ENCOUNTER — Ambulatory Visit: Payer: Medicare HMO | Admitting: Physical Therapy

## 2021-02-23 ENCOUNTER — Other Ambulatory Visit: Payer: Self-pay

## 2021-02-23 ENCOUNTER — Encounter: Payer: Self-pay | Admitting: Physical Therapy

## 2021-02-23 DIAGNOSIS — M25511 Pain in right shoulder: Secondary | ICD-10-CM | POA: Diagnosis not present

## 2021-02-23 DIAGNOSIS — M6281 Muscle weakness (generalized): Secondary | ICD-10-CM | POA: Diagnosis not present

## 2021-02-23 DIAGNOSIS — G8929 Other chronic pain: Secondary | ICD-10-CM

## 2021-02-23 DIAGNOSIS — M25611 Stiffness of right shoulder, not elsewhere classified: Secondary | ICD-10-CM | POA: Diagnosis not present

## 2021-02-23 NOTE — Therapy (Addendum)
[place], [place], [address] Phone: [phone]   Fax:  [phone]  Physical Therapy Treatment  Patient Details  Name: Christopher Wall MRN: [id number] Date of Birth: [birth year] Referring Provider (PT): Rosemarie Ax, MD   Encounter Date: 02/23/2021   PT End of Session - 02/23/21 0951     Visit Number 8    Number of Visits 13    Date for PT Re-Evaluation 03/10/21    Authorization Type [employer]et[employer] MCR; ionto not covered    Progress Note Due on Visit 10    PT Start Time [phone]   pt late   PT Stop Time 1015    PT Time Calculation (min) 32 min             Past Medical History:  Diagnosis Date   Abdominal wall hernia 03/20/2018   ATTENTION DEFICIT, 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 No mood dysfnc   Encounter for neuropsychological testing 2008   Vivien Rossetti (Neuropsy): Mixed anxiety and depression o/w normal.    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  Repeat testing 06/2015 at [facility]: Only finding was  below average in rote memory auditory info encoding. No mood dysfunction.    Overweight (BMI 25.0-29.9) 10/30/2006   Qualifier: History of  By: McDiarmid MD, Todd     Prediabetes 12/21/2013   Pulmonary nodule, left 06/09/2015   Pure hypercholesterolemia 10/30/2006   ACC calculation 10-yr risk of CV event of 9.5% with recommendation to consider Statin therapy of moderate-to-high potency (12/21/13)      Past Surgical History:  Procedure Laterality Date   CATARACT EXTRACTION W/ INTRAOCULAR LENS  IMPLANT, BILATERAL     COCCYGECTOMY  1985   VASECTOMY      Vitals:     Subjective Assessment - 02/23/21 0944     Subjective The pain has not improved any and it might be worse. The arm is stronger and steadier than it was.    Currently in Pain? Yes    Pain Score 1     Pain Location Shoulder    Pain Orientation Right    Pain Descriptors / Indicators Pressure    Pain Type Chronic pain    Aggravating Factors  reaching shoulder height and above    Pain Relieving Factors keep arms down.                [facility] PT Assessment - 02/23/21 0001       AROM   Right Shoulder Internal Rotation --   reaches to upper lumbar  Right Shoulder External Rotation --   Reaches to T2                          [facility] Adult PT Treatment/Exercise - 02/23/21 0001                            Shoulder Exercises: Supine   Protraction 10 reps    Protraction Limitations holding dowel    Horizontal ABduction 10 reps    Theraband Level (Shoulder Horizontal ABduction) Level 2 (Red)    Flexion Limitations single arm punch AROM x 10 right    Other Supine Exercises supine cane chest press then pullover head and back x 10 each      Shoulder Exercises: Standing   External Rotation 10 reps    Theraband Level (Shoulder External Rotation) Level 2 (Red)    External Rotation Limitations reactive isometric x2; Rt    Internal Rotation 10 reps    Theraband Level (Shoulder Internal Rotation) Level 2  (Red)    Internal Rotation Limitations reactive isometric x2 Rt    Other Standing Exercises standing cane AAROM x 10 flexion and scaption, ER      Shoulder Exercises: Pulleys   Flexion 2 minutes    Scaption 1 minute      Manual Therapy   Soft tissue mobilization STW to Deltoid    Passive ROM 4 way PROM right shoulder                      PT Short Term Goals - 02/19/21 0859       PT SHORT TERM GOAL #1   Title Patient will be independent with initial HEP.    Baseline Issued at eval    Time 3    Period Weeks    Status Achieved    Target Date 02/12/21      PT SHORT TERM GOAL #2   Title Patient will demonstrate at least 150 degrees of Rt shoulder flexion and abduction AROM to improve ability to reach overhead.    Baseline flexion 142, abduction 140    Time 3    Period Weeks    Status On-going    Target Date 02/12/21      PT SHORT TERM GOAL #3   Title Patient will demonstrate at least 70 degrees of Rt shoulder ER AROM to improve ability to complete self care activities.    Baseline 51    Time 3    Period Weeks    Status On-going    Target Date 03/05/21               PT Long Term Goals - 01/22/21 1619       PT LONG TERM GOAL #1   Title Patient will demonstrate 4+/5 Rt shoulder strength to improve ability to lift and carry objects.    Baseline See flowsheet    Time 6    Period Weeks    Status New    Target Date 03/05/21      PT LONG TERM GOAL #2   Title Patient will score at least 65% function on FOTO to signify clinically meaningful improvement in functional abilities.    Baseline 48% function    Time 6    Period Weeks    Status New    Target Date 03/05/21      PT LONG TERM GOAL #3   Title  Patient will demonstrate at least 160 degrees of pain free active shoulder flexion to improve ability to reach buttons in his car.    Baseline see flowsheet    Time 6    Period Weeks    Status New    Target Date 03/05/21                    Plan - 02/23/21 1041     Clinical Impression Statement Pt reports continued pain and popping but does feel more strong and stable. He continues with increased pain and pressure when reaching above shoulde height. Continued with gentle strength and AAROM. Performed PROM and STW to deltoid. He was able to reach above head after session with decreased pain.    PT Next Visit Plan Shoulder isometrics, PROM to tolerance, periscapular strengthening, AAROM, scapulohumeral rhythm, Game Ready as needed    PT Home Exercise Plan Access Code [facility]             Patient will benefit from skilled therapeutic intervention in order to improve the following deficits and impairments:  Impaired UE functional use, Pain, Decreased strength, Decreased range of motion, Postural dysfunction  Visit Diagnosis: Chronic right shoulder pain  Muscle weakness (generalized)  Stiffness of right shoulder, not elsewhere classified     Problem List Patient Active Problem List   Diagnosis Date Noted   Subacromial bursitis of right shoulder joint 12/08/2020   Capsulitis of shoulder, right 11/06/2020   Shoulder injury, right, initial encounter 11/02/2020   Abdominal wall hernia 03/20/2018   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 [place], [place], [address] Phone: [phone]   Fax:  [phone]  Name: Christopher Wall MRN: [id number] Date of Birth: Mar 15, 1946

## 2021-02-26 ENCOUNTER — Ambulatory Visit: Payer: Medicare HMO

## 2021-02-26 ENCOUNTER — Other Ambulatory Visit: Payer: Self-pay

## 2021-02-26 DIAGNOSIS — G8929 Other chronic pain: Secondary | ICD-10-CM

## 2021-02-26 DIAGNOSIS — M6281 Muscle weakness (generalized): Secondary | ICD-10-CM | POA: Diagnosis not present

## 2021-02-26 DIAGNOSIS — M25511 Pain in right shoulder: Secondary | ICD-10-CM | POA: Diagnosis not present

## 2021-02-26 DIAGNOSIS — M25611 Stiffness of right shoulder, not elsewhere classified: Secondary | ICD-10-CM

## 2021-02-26 NOTE — Therapy (Signed)
[place], [place], [address] Phone: [phone]   Fax:  [phone]  Physical Therapy Treatment/Discharge   Patient Details  Name: Christopher Wall MRN: [id number] Date of Birth: 1946/02/15 Referring Provider (PT): Rosemarie Ax, MD   Encounter Date: 02/26/2021   PT End of Session - 02/26/21 0931     Visit Number 9    Number of Visits 13    Date for PT Re-Evaluation 03/10/21    Authorization Type [employer] MCR; ionto not covered    Progress Note Due on Visit 10    PT Start Time 0932    PT Stop Time 1015    PT Time Calculation (min) 43 min    Activity Tolerance Patient tolerated treatment well    Behavior During Therapy [facility] for tasks assessed/performed             Past Medical History:  Diagnosis Date   Abdominal wall hernia 03/20/2018   ATTENTION DEFICIT, 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 No mood dysfnc   Encounter for neuropsychological testing 2008   Vivien Rossetti (Neuropsy): Mixed anxiety and depression o/w normal.    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  Repeat testing 06/2015 at [facility]: Only finding was below average in rote memory auditory info encoding. No mood dysfunction.    Overweight (BMI 25.0-29.9) 10/30/2006   Qualifier: History of  By: McDiarmid MD, Todd     Prediabetes 12/21/2013   Pulmonary nodule, left 06/09/2015   Pure hypercholesterolemia 10/30/2006   ACC calculation 10-yr risk of CV event of 9.5% with recommendation to consider Statin therapy of moderate-to-high potency (12/21/13)      Past Surgical History:  Procedure Laterality Date   CATARACT EXTRACTION W/ INTRAOCULAR LENS  IMPLANT, BILATERAL     COCCYGECTOMY  1985   VASECTOMY      There were no vitals filed for this visit.   Subjective Assessment - 02/26/21 0932     Subjective "It's about the same. Feels more stable, but still painful. I can't pick up anything." He reports continued painful, popping and clicking that happens everytime he reaches overhead and at random times.    Currently in Pain? Yes    Pain Score 2    took ibuprofen   Pain Location Shoulder    Pain Orientation Right    Pain Descriptors / Indicators Pressure    Pain Type Chronic pain    Pain Onset More than a month ago    Pain Frequency Constant    Aggravating Factors  reaching overhead,  laying on the Rt side    Pain Relieving Factors rest    Effect of Pain on Daily Activities difficulty performing job and farm activities.                [facility] PT Assessment - 02/26/21 0001       Observation/Other Assessments   Focus on Therapeutic Outcomes (FOTO)  51% function      AROM   Right Shoulder Flexion 150 Degrees   painful popping on the descent   Right Shoulder ABduction 150 Degrees   painful popping on ascent   Right Shoulder Internal Rotation 65 Degrees    Right Shoulder External Rotation 55 Degrees   pain lateral shoulder     Strength   Overall Strength Comments pain with all MMT of the Rt shoulder    Right  Shoulder Flexion 4/5    Right Shoulder ABduction 4+/5    Right Shoulder Internal Rotation 5/5    Right Shoulder External Rotation 4-/5      Special Tests   Other special tests (+) Jerk (+) Clunk (+) O'brien's (-) Bicep Load at 90 and 120 (-) Anterior Slide                           [facility] Adult PT Treatment/Exercise - 02/26/21 0001       Self-Care   Other Self-Care Comments  see patient education      Shoulder Exercises: Supine   Flexion 10 reps    Flexion Limitations AAROM 1 x 10    Other Supine Exercises shoulder ER AAROM 1  x10      Shoulder Exercises: Standing   Flexion 10 reps    Theraband Level (Shoulder Flexion) Level 3 (Green)    Flexion Limitations reactive isometric Rt    ABduction 10 reps    Theraband Level (Shoulder ABduction) Level 3 (Green)    ABduction Limitations reactive isometric Rt    Extension 10 reps    Theraband Level (Shoulder Extension) Level 3 (Green)    Extension Limitations reactive isometric Rt                    PT Education - 02/26/21 1239     Education Details Education on re-assessment findings, differential diagnosis, discharge education. Updated HEP. Modalities for pain control. Recomendation to f/u with referring provider.    Person(s) Educated Patient    Methods Explanation;Demonstration;Verbal cues;Handout;Tactile cues    Comprehension Verbalized understanding;Returned demonstration;Verbal cues required;Tactile cues required              PT Short Term Goals - 02/26/21 1243       PT SHORT TERM GOAL #1   Title Patient will be independent with initial HEP.    Baseline Issued at eval    Time 3    Period Weeks    Status Achieved    Target Date 02/12/21      PT SHORT TERM GOAL #2   Title Patient will demonstrate at least 150 degrees of Rt shoulder flexion and abduction AROM to improve ability to reach overhead.    Time 3    Period Weeks    Status Achieved    Target Date 02/12/21      PT SHORT TERM  GOAL #3   Title Patient will demonstrate at least 70 degrees of Rt shoulder ER AROM to improve ability to complete self care activities.    Baseline see flowsheet  Time 3    Period Weeks    Status Not Met    Target Date 03/05/21               PT Long Term Goals - 02/26/21 1243       PT LONG TERM GOAL #1   Title Patient will demonstrate 4+/5 Rt shoulder strength to improve ability to lift and carry objects.    Baseline See flowsheet    Time 6    Period Weeks    Status Not Met      PT LONG TERM GOAL #2   Title Patient will score at least 65% function on FOTO to signify clinically meaningful improvement in functional abilities.    Baseline 51% function    Time 6    Period Weeks    Status Not Met      PT LONG TERM GOAL #3   Title Patient will demonstrate at least 160 degrees of pain free active shoulder flexion to improve ability to reach buttons in his car.    Baseline see flowsheet    Time 6    Period Weeks    Status On-going                   Plan - 02/26/21 1000     Clinical Impression Statement Patient has attended 9 PT sessions reporting no change in his pain since start of care. He reports improved stability about the shoulder, but continues to have difficulty with reaching, lifting, and carrying due to ongoing pain. He reports painful popping with overhead motion and continues to have positive labral special tests upon today's assessment. His Rt shoulder flexion and abduction AROM have improved compared to initial evaluation, though remain painful with audible pop present in both planes. His strength about the Rt shoulder remains unchanged with the most significant weakness remaining in external rotators. Due to lack of progress it was recommended that he f/u with referring provider for further evaluation. He is therefore appropriate for discharge at this time.    PT Next Visit Plan --    PT Home Exercise Plan Access Code [facility]    Recommended Other  Services f/u with referring provider.    Consulted and Agree with Plan of Care Patient             Patient will benefit from skilled therapeutic intervention in order to improve the following deficits and impairments:  Impaired UE functional use, Pain, Decreased strength, Decreased range of motion, Postural dysfunction  Visit Diagnosis: Chronic right shoulder pain  Muscle weakness (generalized)  Stiffness of right shoulder, not elsewhere classified     Problem List Patient Active Problem List   Diagnosis Date Noted   Subacromial bursitis of right shoulder joint 12/08/2020   Capsulitis of shoulder, right 11/06/2020   Shoulder injury, right, initial encounter 11/02/2020   Abdominal wall hernia 03/20/2018   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 Patient goals were partially met. Patient is being discharged due to lack of progress.  Gwendolyn Grant, PT, DPT, ATC 02/26/21 12:47 PM  [facility] [address] [place], [place], [address] Phone: [phone]   Fax:  [phone]  Name: Christopher Wall MRN: [id number] Date of Birth: 08-12-1946

## 2021-02-27 ENCOUNTER — Ambulatory Visit: Payer: Medicare HMO | Admitting: Family Medicine

## 2021-02-27 NOTE — Progress Notes (Deleted)
Christopher Wall - 75 y.o. male MRN [id number]  Date of birth: Aug 12, 1946  SUBJECTIVE:  Including CC & ROS.  No chief complaint on file.   Christopher Wall is a 75 y.o. male that is  ***.  ***   Review of Systems See HPI   HISTORY: Past Medical, Surgical, Social, and Family History Reviewed & Updated per EMR.   Pertinent Historical Findings include:  Past Medical History:  Diagnosis Date  . Abdominal wall hernia 03/20/2018  . 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 Cataract    bilateral cataracts with lens implant  . Chest pain, atypical 12/05/2017  . COLON POLYP 02/07/2014   Tubular Adenoma x 1 - Dr Ardis Hughs (GI)  . Diverticulosis of colon 07/09/2018   Based on colonoscopy 06/2108  . Encounter for neuropsychological testing 06/26/2015   [facility]: Only B/L avg in rote memory auditory info encoding. No mood dysfnc  . Encounter for neuropsychological testing 2008   Vivien Rossetti (Neuropsy): Mixed anxiety and depression o/w normal.   . Family history of esophageal cancer 12/26/2017  . GASTROESOPHAGEAL REFLUX, NO ESOPHAGITIS 10/30/2006   Qualifier: History of  By: McDiarmid MD, Sherren Mocha    . 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 HYPERLIPIDEMIA 10/30/2006   Qualifier: Diagnosis of  By: Drucie Ip    . Hypertension 04/24/2009   Qualifier: Diagnosis of  By: McDiarmid MD, Sherren Mocha    . Left flank pain 06/15/2018  . Memory difficulties 06/09/2015   Prior neuropsychology testing in 2008 by Dr Valentina Shaggy (Neuropsychologist) was unremarkable other than mixed anxiety/depression.  Repeat testing 06/2015 at [facility]: Only finding was below average in rote memory auditory info encoding. No mood dysfunction.   Marland Kitchen Overweight (BMI 25.0-29.9) 10/30/2006   Qualifier: History of  By: McDiarmid MD, Sherren Mocha    . Prediabetes 12/21/2013  . Pulmonary nodule, left 06/09/2015  . Pure hypercholesterolemia 10/30/2006   ACC  calculation 10-yr risk of CV event of 9.5% with recommendation to consider Statin therapy of moderate-to-high potency (12/21/13)      Past Surgical History:  Procedure Laterality Date  . CATARACT EXTRACTION W/ INTRAOCULAR LENS  IMPLANT, BILATERAL    . COCCYGECTOMY  1985  . VASECTOMY      Family History  Problem Relation Age of Onset  . Hypertension Mother   . Stroke Mother 56  . Dementia Mother 64  . Diabetes Father   . Diabetes Brother   . Esophageal cancer Brother   . Lymphoma Brother   . Esophageal cancer Brother   . Lymphoma Brother   . Colon cancer Neg Hx     Social History   Socioeconomic History  . Marital status: Married    Spouse name: Manuela Schwartz  . Number of children: 4  . Years of education: >20  . Highest education level: Not on file  Occupational History  . Occupation: Pharmacist, community  . Occupation: Pharmacist  Tobacco Use  . Smoking status: Never  . Smokeless tobacco: Never  Vaping Use  . Vaping Use: Never used  Substance and Sexual Activity  . Alcohol use: Yes    Alcohol/week: 3.0 standard drinks    Types: 3 drink(s) per week  . Drug use: No  . Sexual activity: Yes    Comment: monogamous  Other Topics Concern  . 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   4  adult sons ([place], [place], [place]): 3 biological and 1 step-child   Patient is third of six children   No formal HCPOA executed (12/21/13)   No Advanced Directive executed (12/21/13)   He and wife are raising a granddaughter (2016)   Hobbies: Motorcycling and Horse riding.         Social Determinants of Health   Financial Resource Strain: Not on file  Food Insecurity: Not on file  Transportation Needs: Not on file  Physical Activity: Not on file  Stress: Not on file  Social Connections: Not on file  Intimate Partner Violence: Not on file     PHYSICAL EXAM:  VS: There were no vitals taken for this visit. Physical Exam Gen: NAD, alert,  cooperative with exam, well-appearing MSK:  ***      ASSESSMENT & PLAN:   No problem-specific Assessment & Plan notes found for this encounter.

## 2021-02-28 ENCOUNTER — Other Ambulatory Visit: Payer: Self-pay

## 2021-02-28 ENCOUNTER — Ambulatory Visit: Payer: Self-pay

## 2021-02-28 ENCOUNTER — Encounter: Payer: Self-pay | Admitting: Family Medicine

## 2021-02-28 ENCOUNTER — Ambulatory Visit: Payer: Medicare HMO | Admitting: Family Medicine

## 2021-02-28 VITALS — BP 148/88 | Ht 68.0 in | Wt 185.0 lb

## 2021-02-28 DIAGNOSIS — M778 Other enthesopathies, not elsewhere classified: Secondary | ICD-10-CM

## 2021-02-28 MED ORDER — TRIAMCINOLONE ACETONIDE 40 MG/ML IJ SUSP
40.0000 mg | Freq: Once | INTRAMUSCULAR | Status: AC
  Administered 2021-02-28: 40 mg via INTRA_ARTICULAR

## 2021-02-28 NOTE — Patient Instructions (Signed)
Good to see you Please continue the range of motion  Please call [phone] to schedule the MRI   Please send me a message in MyChart with any questions or updates.  We will setup a virtual visit once the MRI is resulted.   --Dr. Raeford Razor
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
## 2021-03-02 ENCOUNTER — Encounter: Payer: Medicare HMO | Admitting: Physical Therapy

## 2021-03-08 ENCOUNTER — Other Ambulatory Visit: Payer: Self-pay | Admitting: Family Medicine

## 2021-03-08 DIAGNOSIS — Z77018 Contact with and (suspected) exposure to other hazardous metals: Secondary | ICD-10-CM

## 2021-03-09 ENCOUNTER — Telehealth: Payer: Self-pay | Admitting: *Deleted

## 2021-03-09 DIAGNOSIS — M778 Other enthesopathies, not elsewhere classified: Secondary | ICD-10-CM

## 2021-03-09 NOTE — Telephone Encounter (Signed)
 Imaging is not participating with the [employer]. Please change order to a different imaging location.

## 2021-03-09 NOTE — Telephone Encounter (Signed)
Change MRI location to the med center [facility] for MR arthrogram of the right shoulder.  Rosemarie Ax, MD [facility] 03/09/2021, 1:55 PM

## 2021-03-12 NOTE — Telephone Encounter (Signed)
Left message for patient to call back to inform him the location for his MRI has changed to [place]. PreCert is pending

## 2021-03-12 NOTE — Telephone Encounter (Signed)
Pt informed of below.  

## 2021-03-16 ENCOUNTER — Other Ambulatory Visit: Payer: Medicare HMO

## 2021-04-03 NOTE — Telephone Encounter (Signed)
MRI authorized/covered via [employer]. Auth # [id number].

## 2021-04-03 NOTE — Addendum Note (Signed)
Addended by: Cresenciano Lick on: 04/03/2021 10:51 AM   Modules accepted: Orders

## 2021-04-09 NOTE — Telephone Encounter (Signed)
Left detailed message informing pt to contact [facility] to schedule his MRI. Auth is valid 03/19/21- 09/05/2021. Auth # [id number].

## 2021-04-09 NOTE — Addendum Note (Signed)
Addended by: Cresenciano Lick on: 04/09/2021 08:42 AM   Modules accepted: Orders

## 2021-04-12 NOTE — Addendum Note (Signed)
Addended by: Cyd Silence on: 04/12/2021 11:51 AM   Modules accepted: Orders

## 2021-04-14 ENCOUNTER — Other Ambulatory Visit: Payer: Medicare HMO

## 2021-04-23 ENCOUNTER — Ambulatory Visit (INDEPENDENT_AMBULATORY_CARE_PROVIDER_SITE_OTHER): Payer: Medicare HMO

## 2021-04-23 ENCOUNTER — Ambulatory Visit (INDEPENDENT_AMBULATORY_CARE_PROVIDER_SITE_OTHER): Payer: Medicare HMO | Admitting: Sports Medicine

## 2021-04-23 ENCOUNTER — Other Ambulatory Visit: Payer: Self-pay

## 2021-04-23 DIAGNOSIS — M75111 Incomplete rotator cuff tear or rupture of right shoulder, not specified as traumatic: Secondary | ICD-10-CM | POA: Diagnosis not present

## 2021-04-23 DIAGNOSIS — M25511 Pain in right shoulder: Secondary | ICD-10-CM

## 2021-04-23 DIAGNOSIS — M778 Other enthesopathies, not elsewhere classified: Secondary | ICD-10-CM

## 2021-04-23 DIAGNOSIS — M7581 Other shoulder lesions, right shoulder: Secondary | ICD-10-CM
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
## 2021-04-27 ENCOUNTER — Telehealth (INDEPENDENT_AMBULATORY_CARE_PROVIDER_SITE_OTHER): Payer: Medicare HMO | Admitting: Family Medicine

## 2021-04-27 ENCOUNTER — Other Ambulatory Visit: Payer: Self-pay

## 2021-04-27 DIAGNOSIS — S46011D Strain of muscle(s) and tendon(s) of the rotator cuff of right shoulder, subsequent encounter: Secondary | ICD-10-CM
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
## 2021-04-27 NOTE — Progress Notes (Signed)
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  Location: Patient: home Provider: office   I discussed the limitations, risks, security and privacy concerns of performing an evaluation and management service by telephone and the availability of in person appointments. I also discussed with the patient that there may be a patient responsible charge related to this service. The patient expressed understanding and agreed to proceed.   History of Present Illness:  [name] is a 75 year old male that is following up after the MRI of his right shoulder.  This was demonstrating a large high-grade partial-thickness near complete supraspinatus tear.  He still continues to have pain after his fall several months ago.   Observations/Objective:  Gen: NAD, alert, cooperative with exam, well-appearing  Assessment and Plan:  Supraspinatus tear: Continues to have pain from his injury in January an MRI was demonstrating a supraspinatus tear. -Counseled on exercise therapy supportive care. -Referral to orthopedic surgery.  Follow Up Instructions:    I discussed the assessment and treatment plan with the patient. The patient was provided an opportunity to ask questions and all were answered. The patient agreed with the plan and demonstrated an understanding of the instructions.   The patient was advised to call back or seek an in-person evaluation if the symptoms worsen or if the condition fails to improve as anticipated.  I provided 7 minutes of non-face-to-face time during this encounter.   Clearance Coots, MD

## 2021-05-04 DIAGNOSIS — M75121 Complete rotator cuff tear or rupture of right shoulder, not specified as traumatic: Secondary | ICD-10-CM | POA: Diagnosis not present

## 2021-08-20 DIAGNOSIS — M7541 Impingement syndrome of right shoulder: Secondary | ICD-10-CM | POA: Diagnosis not present

## 2021-08-20 DIAGNOSIS — S46011A Strain of muscle(s) and tendon(s) of the rotator cuff of right shoulder, initial encounter: Secondary | ICD-10-CM | POA: Diagnosis not present

## 2021-08-20 DIAGNOSIS — G8918 Other acute postprocedural pain: Secondary | ICD-10-CM | POA: Diagnosis not present

## 2021-08-20 DIAGNOSIS — Y999 Unspecified external cause status: Secondary | ICD-10-CM | POA: Diagnosis not present

## 2021-08-20 DIAGNOSIS — M24111 Other articular cartilage disorders, right shoulder: Secondary | ICD-10-CM | POA: Diagnosis not present

## 2021-08-20 DIAGNOSIS — X58XXXA Exposure to other specified factors, initial encounter: Secondary | ICD-10-CM | POA: Diagnosis not present

## 2021-08-20 DIAGNOSIS — M94211 Chondromalacia, right shoulder: Secondary | ICD-10-CM | POA: Diagnosis not present

## 2021-09-07 NOTE — Therapy (Signed)
OUTPATIENT PHYSICAL THERAPY SHOULDER EVALUATION   Patient Name: Christopher Wall, Christopher Wall MRN: [id number] DOB:1945/11/16, 76 y.o., male Today's Date: 09/08/2021   PT End of Session - 09/08/21 0915     Visit Number 1    Number of Visits 17    Date for PT Re-Evaluation 11/03/21    Authorization Type [employer] MCR    Authorization Time Period FOTO v6, v10    Progress Note Due on Visit 10    PT Start Time 0900    PT Stop Time 0945    PT Time Calculation (min) 45 min    Activity Tolerance Patient tolerated treatment well    Behavior During Therapy [facility] for tasks assessed/performed             Past Medical History:  Diagnosis Date   Abdominal wall hernia 03/20/2018   ATTENTION DEFICIT, W/O HYPERACTIVITY 10/30/2006   Qualifier: History of  By: McDiarmid MD, Todd     Cataract    bilateral cataracts with lens implant   Chest pain, atypical 12/05/2017   COLON POLYP 02/07/2014   Tubular Adenoma x 1 - Dr Ardis Hughs (GI)   Diverticulosis of colon 07/09/2018   Based on colonoscopy 06/2108   Encounter for neuropsychological testing 06/26/2015   [facility]: Only B/L avg in rote memory auditory info encoding. No mood dysfnc   Encounter for neuropsychological testing 2008   Vivien Rossetti (Neuropsy): Mixed anxiety and depression o/w normal.    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  Repeat testing 06/2015 at [facility]: Only finding was below average in rote memory auditory info encoding.  No mood dysfunction.    Overweight (BMI 25.0-29.9) 10/30/2006   Qualifier: History of  By: McDiarmid MD, Todd     Prediabetes 12/21/2013   Pulmonary nodule, left 06/09/2015   Pure hypercholesterolemia 10/30/2006   ACC calculation 10-yr risk of CV event of 9.5% with recommendation to consider Statin therapy of moderate-to-high potency (12/21/13)     Past Surgical History:  Procedure Laterality Date   CATARACT EXTRACTION W/ INTRAOCULAR LENS  IMPLANT, BILATERAL     COCCYGECTOMY  1985   VASECTOMY     Patient Active Problem List   Diagnosis Date Noted   Rotator cuff tear 12/08/2020   Capsulitis of shoulder, right 11/06/2020   Shoulder injury, right, initial encounter 11/02/2020   Abdominal wall hernia 03/20/2018   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 Tamera Punt. His last orthopedic appointment was two weeks, at which point, he was told to continue to wear his sling as needed. He arrives without his sling today. He was told to avoid active shoulder flexion, extension, and IR. He reports mild (1.5/10) pain today, adding that sometimes it doesn't hurt at all.   PERTINENT HISTORY: Rt RCR, SAD on 08/20/2021 with Dr. Tamera Punt  PAIN:  Are you having pain? Yes VAS scale:  1.5/10 Pain location: shoulder Pain orientation: Anterior and Upper  PAIN TYPE: aching and dull Pain description: intermittent  Aggravating factors: Sudden movements Relieving factors: Tylenol, ibuprofen (occasionally)  PRECAUTIONS: Shoulder, follow Dr. Rich Fuchs rotator cuff repair protocol  WEIGHT BEARING RESTRICTIONS No  FALLS:  Has patient fallen in last 6 months? No Number of falls: 0  LIVING ENVIRONMENT: Lives with: lives with their family Lives in: House/apartment Stairs: Yes; Internal: 15 steps; on right going up Has following equipment at home: None  OCCUPATION: Dentist  PLOF: Independent  PATIENT GOALS Be able to saddle his horses (25-50 pounds), get dressed without limitation  OBJECTIVE:   DIAGNOSTIC FINDINGS:  MR Shoulder Right W Contrast 04/23/2021: IMPRESSION: 1. Severe tendinosis of the supraspinatus tendon with a large high-grade partial-thickness, near complete, tear involving the bursal and articular surfaces. 2. Severe tendinosis of the infraspinatus tendon which appears macerated although there is no dicrete full thickness tear. 3. Mild tendinosis of the subscapularis tendon.  PATIENT SURVEYS:  FOTO 51%, projected 68% in 16 visits  COGNITION:  Overall cognitive status: Within functional limits for tasks assessed     SENSATION:  Light touch: Appears intact    POSTURE: WNL  PALPATION: TTP along surgical incisions  UPPER EXTREMITY AROM/PROM:  A/PROM Right 09/08/2021 Left 09/08/2021  Shoulder flexion PROM: 85d, limited by dull pain at end range   Shoulder extension    Shoulder abduction PROM: 75d, limited by dull pain at end range   Shoulder adduction    Shoulder internal rotation PROM: 15d   Shoulder external rotation PROM: 40d   Elbow flexion AROM: 145   Elbow extension AROM: -15, limited by dull shoulder pain   (Blank rows = not tested)  UPPER EXTREMITY MMT:  MMT Right 09/08/2021 Left 09/08/2021  Shoulder flexion 2+/5 mild p!    Shoulder extension    Shoulder abduction 2+/5   Shoulder ER 2+/5   Shoulder IR 2+/5   Shoulder adduction 2+/5   Middle trapezius 4/5   Lower trapezius    Elbow flexion 4/5, mild tenderness   Elbow extension 5/5   Grip strength (Lbs) 74 79  (Blank rows = not tested)   JOINT MOBILITY TESTING:  Not performed due to post-op status    TODAY'S TREATMENT:  [facility] Adult PT Treatment:                                                DATE: 09/08/2021 Therapeutic Exercise: Seated scapular retraction with shoulder depression x10 with 3-sec hold Seated elbow extension AROM over knee with 5-sec holds x10 Seated composite grip with yellow theraputty x10 Seated shoulder pendulums x10 AP and lateral Manual Therapy: N/A Neuromuscular re-ed: N/A Therapeutic Activity: N/A Modalities: N/A Self Care: N/A    PATIENT EDUCATION: Education details: Pt educated on prognosis, POC, and HEP Person educated: Patient  Education method: Explanation, demonstration, handout Education comprehension: verbalized understanding and returned demonstration   HOME EXERCISE PROGRAM: Access Code: [email or web address] URL: [email or web address]  Exercises Seated Scapular Retraction - 2 x daily - 7 x weekly - 3 sets - 10 reps - 3 hold Seated elbow extension AROM stretch OVER KNEE - 2 x daily - 7 x weekly - 2 sets - 10 reps Seated Finger Composite Flexion with Putty - 2 x daily - 7 x weekly - 3 sets - 20 reps Seated Shoulder Pendulum Exercise - 2 x daily - 7 x weekly - 3 sets - 20 reps   ASSESSMENT:  CLINICAL IMPRESSION: Patient is a 76 y.o. M who was seen today for physical therapy evaluation and treatment 2 weeks 5 days s/p Rt RCR, SAD on 08/20/2021 with Dr. Tamera Punt. Upon assessment, the pt is limited in global Rt shoulder PROM, Rt shoulder strength, parascapular strength, Rt grip strength, and TTP to surgical incisions. Patient will benefit from skilled PT to address above impairments and improve overall  function.  REHAB POTENTIAL: Excellent  CLINICAL DECISION MAKING: Stable/uncomplicated  EVALUATION COMPLEXITY: Low   GOALS: Goals reviewed with patient? Yes  SHORT TERM GOALS:  STG Name Target Date Goal status  1 Pt will report understanding and adherence to his HEP in order to promote independence in the management of his primary impairments. Baseline:  10/06/2021 INITIAL  2 Pt will achieve >120 degrees shoulder elevation PROM in order to promote WNL AROM when reaching that point in his rehab protocol Baseline:  10/06/2021 INITIAL   LONG TERM GOALS:   LTG Name Target Date Goal status  1 Pt will achieve >160 degrees shoulder elevation AROM  in order to get dressed without limitation. Baseline: 11/03/2021 INITIAL  2 Pt will achieve a FOTO score of 68% in order to demonstrate improved functional ability as it relates to his shoulder impairments.  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 Baseline: 11/03/2021 INITIAL  4 Pt will demonstrate ability to lift 25 pounds with BIL UE in order to put a saddle on his horses. Baseline: 11/03/2021 INITIAL  5 Pt will achieve >50 degrees of Rt shoulder ER and IR AROM in order to put on his seatbelt with less limitation. Baseline: 11/03/2021 INITIAL   PLAN: PT FREQUENCY: 2x/week  PT DURATION: 8 weeks  PLANNED INTERVENTIONS: Therapeutic exercises, Therapeutic activity, Neuro Muscular re-education, Patient/Family education, Joint mobilization, Spinal mobilization, Cryotherapy, Taping, Vasopneumatic device, and Manual therapy  PLAN FOR NEXT SESSION: Progress shoulder PROM/ scapular and elbow strengthening in accordance to his post-op protocol   Vanessa [place], PT, DPT 09/08/21 10:53 AM

## 2021-09-08 ENCOUNTER — Ambulatory Visit: Payer: Medicare HMO | Attending: Surgical

## 2021-09-08 ENCOUNTER — Other Ambulatory Visit: Payer: Self-pay

## 2021-09-08 DIAGNOSIS — M25511 Pain in right shoulder: Secondary | ICD-10-CM | POA: Insufficient documentation

## 2021-09-08 DIAGNOSIS — M6281 Muscle weakness (generalized): Secondary | ICD-10-CM | POA: Insufficient documentation

## 2021-09-08 DIAGNOSIS — G8929 Other chronic pain: Secondary | ICD-10-CM | POA: Diagnosis not present

## 2021-09-08 DIAGNOSIS — M25611 Stiffness of right shoulder, not elsewhere classified: Secondary | ICD-10-CM | POA: Insufficient documentation

## 2021-09-17 ENCOUNTER — Other Ambulatory Visit: Payer: Self-pay

## 2021-09-17 ENCOUNTER — Ambulatory Visit: Payer: Medicare HMO

## 2021-09-17 DIAGNOSIS — M6281 Muscle weakness (generalized): Secondary | ICD-10-CM

## 2021-09-17 DIAGNOSIS — M25611 Stiffness of right shoulder, not elsewhere classified: Secondary | ICD-10-CM | POA: Diagnosis not present

## 2021-09-17 DIAGNOSIS — G8929 Other chronic pain: Secondary | ICD-10-CM

## 2021-09-17 DIAGNOSIS — M25511 Pain in right shoulder: Secondary | ICD-10-CM | POA: Diagnosis not present

## 2021-09-17 NOTE — Therapy (Signed)
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 DOB:September 02, 1946, 76 y.o., male Today's Date: 09/17/2021  PCP: McDiarmid, Blane Ohara, MD REFERRING PROVIDER: McDiarmid, Blane Ohara, MD   PT End of Session - 09/17/21 0915     Visit Number 2    Number of Visits 17    Date for PT Re-Evaluation 11/03/21    Authorization Type [employer] MCR    Authorization Time Period FOTO v6, v10    Progress Note Due on Visit 10    PT Start Time 0915    PT Stop Time 0945    PT Time Calculation (min) 30 min    Activity Tolerance Patient tolerated treatment well    Behavior During Therapy [facility] for tasks assessed/performed             Past Medical History:  Diagnosis Date   Abdominal wall hernia 03/20/2018   ATTENTION DEFICIT, 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 No mood dysfnc   Encounter for neuropsychological testing 2008   Vivien Rossetti (Neuropsy): Mixed anxiety and depression o/w normal.    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  Repeat testing 06/2015 at [facility]:  Only finding was below average in rote memory auditory info encoding. No mood dysfunction.    Overweight (BMI 25.0-29.9) 10/30/2006   Qualifier: History of  By: McDiarmid MD, Todd     Prediabetes 12/21/2013   Pulmonary nodule, left 06/09/2015   Pure hypercholesterolemia 10/30/2006   ACC calculation 10-yr risk of CV event of 9.5% with recommendation to consider Statin therapy of moderate-to-high potency (12/21/13)     Past Surgical History:  Procedure Laterality Date   CATARACT EXTRACTION W/ INTRAOCULAR LENS  IMPLANT, BILATERAL     COCCYGECTOMY  1985   VASECTOMY     Patient Active Problem List   Diagnosis Date Noted   Rotator cuff tear 12/08/2020   Capsulitis of shoulder, right 11/06/2020   Shoulder injury, right, initial encounter 11/02/2020   Abdominal wall hernia 03/20/2018   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 Tamera Punt  PRECAUTIONS: Shoulder, follow Dr. Rich Fuchs rotator cuff repair protocol  SUBJECTIVE:  Pt presents to PT with continued reports of R knee pain. Has been  compliant with HEP with no adverse effect. Pt is ready to begin PT at this time.  Pain: Are you having pain? No Numerical Pain Scale: 1.5/10 Pain Location: R shoulder Pain Frequency/Description: intermittent    OBJECTIVE:   PATIENT SURVEYS:  FOTO 51%, projected 68% in 16 visits    UPPER EXTREMITY AROM/PROM:   A/PROM Right 09/08/2021 Left 09/08/2021  Shoulder flexion PROM: 85d, limited by dull pain at end range    Shoulder extension      Shoulder abduction PROM: 75d, limited by dull pain at end range    Shoulder  adduction      Shoulder internal rotation PROM: 15d    Shoulder external rotation PROM: 40d    Elbow flexion AROM: 145    Elbow extension AROM: -15, limited by dull shoulder pain    (Blank rows = not tested)   UPPER EXTREMITY MMT:   MMT Right 09/08/2021 Left 09/08/2021  Shoulder flexion 2+/5 mild p!    Shoulder extension      Shoulder abduction 2+/5    Shoulder ER 2+/5    Shoulder IR 2+/5    Shoulder adduction 2+/5    Middle trapezius 4/5    Lower trapezius      Elbow flexion 4/5, mild tenderness    Elbow extension 5/5    Grip strength (Lbs) 74 79  (Blank rows = not tested)                TODAY'S TREATMENT:  [facility] Adult PT Treatment:  Therapeutic Exercise: Seated scapular retraction with shoulder depression 2x10 with 3-sec hold Seated elbow extension AROM over knee with 2x15 Standing shoulder pendulums x10 AP and lateral Manual Therapy: PROM R shoulder flex/abd/IR/ER Neuromuscular re-ed: N/A Therapeutic Activity: N/A Modalities: N/A Self Care: N/A       PATIENT EDUCATION: Education details: Pt educated on prognosis, POC, and HEP Person educated: Patient Education method: Explanation, demonstration, handout Education comprehension: verbalized understanding and returned demonstration     HOME EXERCISE PROGRAM: Access Code: [email or web address] URL: [email or web address]   Exercises Seated Scapular Retraction - 2 x daily - 7 x weekly - 3 sets - 10 reps - 3 hold Seated elbow extension AROM stretch OVER KNEE - 2 x daily - 7 x weekly - 2 sets - 10 reps Seated Finger Composite Flexion with Putty - 2 x daily - 7 x weekly - 3 sets - 20 reps Seated Shoulder Pendulum Exercise - 2 x daily - 7 x weekly - 3 sets - 20 reps     ASSESSMENT:   CLINICAL IMPRESSION: Pt was able to complete prescribed exercises and tolerated PROM well with no adverse effect. He shows most limitation passively into R shoulder abduction, with increase in pain also noted by pt. Otherwise,  passive motion progressing well with range WFL in flex/IR/ER. He continues to benefit from skilled therapy services and will continue to be progressed per protocol.    REHAB POTENTIAL: Excellent   CLINICAL DECISION MAKING: Stable/uncomplicated   EVALUATION COMPLEXITY: Low     GOALS: Goals reviewed with patient? Yes   SHORT TERM GOALS:   STG Name Target Date Goal status  1 Pt will report understanding and adherence to his HEP in order to promote independence in the management of his primary impairments. Baseline:  10/06/2021 INITIAL  2 Pt will achieve >120 degrees shoulder elevation PROM in order to promote WNL AROM when reaching that point in his rehab protocol Baseline:  10/06/2021 INITIAL    LONG TERM GOALS:  LTG Name Target Date Goal status  1 Pt will achieve >160 degrees shoulder elevation AROM  in order to get dressed without limitation. Baseline: 11/03/2021 INITIAL  2 Pt will achieve a FOTO score of 68% in order to demonstrate improved functional ability as it relates to his shoulder impairments.  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 Baseline: 11/03/2021 INITIAL  4 Pt will demonstrate ability to lift 25 pounds with BIL UE in order to put a saddle on his horses. Baseline: 11/03/2021 INITIAL  5 Pt will achieve >50 degrees of Rt shoulder ER and IR AROM in order to put on his seatbelt with less limitation. Baseline: 11/03/2021 INITIAL    PLAN: PT FREQUENCY: 2x/week   PT DURATION: 8 weeks   PLANNED INTERVENTIONS: Therapeutic exercises, Therapeutic activity, Neuro Muscular re-education, Patient/Family education, Joint mobilization, Spinal mobilization, Cryotherapy, Taping, Vasopneumatic device, and Manual therapy   PLAN FOR NEXT SESSION: Progress shoulder PROM/ scapular and elbow strengthening in accordance to his post-op protocol    Ward Chatters 09/17/2021, 9:50  AM

## 2021-09-20 NOTE — Therapy (Signed)
OUTPATIENT PHYSICAL THERAPY TREATMENT NOTE   Patient Name: Christopher Wall, Christopher Wall MRN: [id number] DOB:13-Jan-1946, 76 y.o., male Today's Date: 09/21/2021  PCP: McDiarmid, Blane Ohara, MD REFERRING PROVIDER: Sheryle Hail, PA-C   PT End of Session - 09/21/21 1131     Visit Number 3    Number of Visits 17    Date for PT Re-Evaluation 11/03/21    Authorization Type [employer] MCR    Authorization Time Period FOTO v6, v10    Progress Note Due on Visit 10    PT Start Time 1132    PT Stop Time 1215   15 minutes vasopneumatic treatment   PT Time Calculation (min) 43 min    Activity Tolerance Patient tolerated treatment well    Behavior During Therapy [facility] for tasks assessed/performed              Past Medical History:  Diagnosis Date   Abdominal wall hernia 03/20/2018   ATTENTION DEFICIT, 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 No mood dysfnc   Encounter for neuropsychological testing 2008   Vivien Rossetti (Neuropsy): Mixed anxiety and depression o/w normal.    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  Repeat testing  06/2015 at [facility]: Only finding was below average in rote memory auditory info encoding. No mood dysfunction.    Overweight (BMI 25.0-29.9) 10/30/2006   Qualifier: History of  By: McDiarmid MD, Todd     Prediabetes 12/21/2013   Pulmonary nodule, left 06/09/2015   Pure hypercholesterolemia 10/30/2006   ACC calculation 10-yr risk of CV event of 9.5% with recommendation to consider Statin therapy of moderate-to-high potency (12/21/13)     Past Surgical History:  Procedure Laterality Date   CATARACT EXTRACTION W/ INTRAOCULAR LENS  IMPLANT, BILATERAL     COCCYGECTOMY  1985   VASECTOMY     Patient Active Problem List   Diagnosis Date Noted   Rotator cuff tear 12/08/2020   Capsulitis of shoulder, right 11/06/2020   Shoulder injury, right, initial encounter 11/02/2020   Abdominal wall hernia 03/20/2018   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 Tamera Punt  PRECAUTIONS: Shoulder, follow Dr. Rich Fuchs rotator cuff repair protocol  SUBJECTIVE:  Pt reports continued daily improvements in his shoulder sxs, although he states that sometimes he accidentally  reaches for something and has pain. He adds that his HEP has been going well.  Pain: Are you having pain? Yes Numerical Pain Scale: 0.5/10 Pain Location: Rt shoulder Pain Frequency/Description: intermittent    OBJECTIVE:   *Unless otherwise noted, objective measures collected previously*  PATIENT SURVEYS:  FOTO 51%, projected 68% in 16 visits    UPPER EXTREMITY AROM/PROM:   A/PROM Right 09/08/2021 Right 09/21/2021  Shoulder flexion PROM: 85d, limited by dull pain at end range  PROM:  115, limited by pt reported tightness at end range  Shoulder extension      Shoulder abduction PROM: 75d, limited by dull pain at end range  PROM: 100d, limited by pt reported dull pain at end range  Shoulder adduction      Shoulder internal rotation PROM: 15d PROM: 45d   Shoulder external rotation PROM: 40d PROM: 50d   Elbow flexion AROM: 145    Elbow extension AROM: -15, limited by dull shoulder pain    (Blank rows = not tested)   UPPER EXTREMITY MMT:   MMT Right 09/08/2021 Left 09/08/2021  Shoulder flexion 2+/5 mild p!    Shoulder extension      Shoulder abduction 2+/5    Shoulder ER 2+/5    Shoulder IR 2+/5    Shoulder adduction 2+/5    Middle trapezius 4/5    Lower trapezius      Elbow flexion 4/5, mild tenderness    Elbow extension 5/5    Grip strength (Lbs) 74 79  (Blank rows = not tested)                TODAY'S TREATMENT:   [facility] Adult PT Treatment:                                                DATE: 09/21/2021 Therapeutic Exercise: Supine Rt shoulder ER AAROM with wooden rod 2x10 with 5-sec hold Supine Rt shoulder flexion AAROM with handhold 2x10 with 5-sec hold Manual Therapy: PROM with gentle distraction/ vibrations into shoulder flexion, abduction and ER Neuromuscular re-ed: N/A  Therapeutic Activity: N/A Modalities: Game Ready Vasopneumatic device to Rt shoulder x15 minutes at 34 degrees Self Care: N/A   [facility] Adult PT Treatment:  Therapeutic Exercise 09/17/2021: Seated scapular retraction with shoulder depression 2x10 with 3-sec hold Seated elbow extension AROM over knee with 2x15 Standing shoulder pendulums x10 AP and lateral Manual Therapy: PROM R shoulder flex/abd/IR/ER Neuromuscular re-ed: N/A Therapeutic Activity: N/A Modalities: N/A Self Care: N/A       PATIENT EDUCATION: Education details: Pt educated on post-op protocol and progression to next phase of shoulder AAROM Person educated: Patient Education method: Explanation,  demonstration, handout Education comprehension: verbalized understanding and returned demonstration     HOME EXERCISE PROGRAM: Access Code: [email or web address] URL: [email or web address]   Exercises Seated Scapular Retraction - 2 x daily - 7 x weekly - 3 sets - 10 reps - 3 hold Seated elbow extension AROM stretch OVER KNEE - 2 x daily - 7 x weekly - 2 sets - 10 reps Seated Finger Composite Flexion with Putty - 2 x daily - 7 x weekly - 3 sets - 20 reps Seated Shoulder Pendulum Exercise - 2 x daily - 7 x weekly - 3 sets - 20 reps     ASSESSMENT:   CLINICAL IMPRESSION: Pt responded well to all interventions today, demonstrating  improved global Rt shoulder PROM and only mild increase in pain with selected interventions from 0.5/10 to 2.5/10. This returned to baseline following vasopneumatic treatment. He will continue to benefit from skilled PT to address his primary impairments and return to his prior level of function. Will plan to introduce shoulder AAROM to pt's HEP at next visit as he responded well to these exercises today.   REHAB POTENTIAL: Excellent   CLINICAL DECISION MAKING: Stable/uncomplicated   EVALUATION COMPLEXITY: Low     GOALS: Goals reviewed with patient? Yes   SHORT TERM GOALS:   STG Name Target Date Goal status  1 Pt will report understanding and adherence to his HEP in order to promote independence in the management of his primary impairments. Baseline: Pt reports adherence to his HEP 10/06/2021 ACHIEVED  2 Pt will achieve >120 degrees shoulder elevation PROM in order to promote WNL AROM when reaching that point in his rehab protocol Baseline: progressed from 85 degrees to 115 degrees on 09/21/2021 10/06/2021 IN PROGRESS    LONG TERM GOALS:    LTG Name Target Date Goal status  1 Pt will achieve >160 degrees shoulder elevation AROM  in order to get dressed without limitation. Baseline: 11/03/2021 INITIAL  2 Pt will achieve a FOTO score of 68% in order to  demonstrate improved functional ability as it relates to his shoulder impairments.  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 Baseline: 11/03/2021 INITIAL  4 Pt will demonstrate ability to lift 25 pounds with BIL UE in order to put a saddle on his horses. Baseline: 11/03/2021 INITIAL  5 Pt will achieve >50 degrees of Rt shoulder ER and IR AROM in order to put on his seatbelt with less limitation. Baseline: 11/03/2021 INITIAL    PLAN: PT FREQUENCY: 2x/week   PT DURATION: 8 weeks   PLANNED INTERVENTIONS: Therapeutic exercises, Therapeutic activity, Neuro Muscular re-education, Patient/Family education, Joint mobilization, Spinal mobilization, Cryotherapy, Taping, Vasopneumatic device, and Manual therapy   PLAN FOR NEXT SESSION: Progress shoulder PROM/ scapular and elbow strengthening in accordance to his post-op protocol; add shoulder AAROM to HEP    Ferris 09/21/2021, 12:07 PM

## 2021-09-21 ENCOUNTER — Other Ambulatory Visit: Payer: Self-pay

## 2021-09-21 ENCOUNTER — Ambulatory Visit: Payer: Medicare HMO

## 2021-09-21 DIAGNOSIS — M6281 Muscle weakness (generalized): Secondary | ICD-10-CM

## 2021-09-21 DIAGNOSIS — M25511 Pain in right shoulder: Secondary | ICD-10-CM | POA: Diagnosis not present

## 2021-09-21 DIAGNOSIS — G8929 Other chronic pain: Secondary | ICD-10-CM | POA: Diagnosis not present

## 2021-09-21 DIAGNOSIS — M25611 Stiffness of right shoulder, not elsewhere classified: Secondary | ICD-10-CM | POA: Diagnosis not present

## 2021-09-21 NOTE — Patient Instructions (Signed)
Pt instructed to continue with post-op protocol. Educated on current phase and timeline of progressing to next phase.

## 2021-09-24 ENCOUNTER — Other Ambulatory Visit: Payer: Self-pay

## 2021-09-24 ENCOUNTER — Ambulatory Visit: Payer: Medicare HMO

## 2021-09-24 DIAGNOSIS — G8929 Other chronic pain: Secondary | ICD-10-CM

## 2021-09-24 DIAGNOSIS — M6281 Muscle weakness (generalized): Secondary | ICD-10-CM | POA: Diagnosis not present

## 2021-09-24 DIAGNOSIS — M25611 Stiffness of right shoulder, not elsewhere classified: Secondary | ICD-10-CM | POA: Diagnosis not present

## 2021-09-24 DIAGNOSIS — M25511 Pain in right shoulder: Secondary | ICD-10-CM | POA: Diagnosis not present

## 2021-09-24 NOTE — Therapy (Signed)
OUTPATIENT PHYSICAL THERAPY TREATMENT NOTE   Patient Name: Christopher Wall, DDS MRN: [id number] DOB:[birth year], 76 y.o., male Today's Date: 09/24/2021  PCP: McDiarmid, Blane Ohara, MD REFERRING PROVIDER: McDiarmid, Blane Ohara, MD   PT End of Session - 09/24/21 0921     Visit Number 4    Number of Visits 17    Date for PT Re-Evaluation 11/03/21    Authorization Type Aetna MCR    Authorization Time Period FOTO v6, v10    Progress Note Due on Visit 10    PT Start Time 0921    PT Stop Time 1000    PT Time Calculation (min) 39 min    Activity Tolerance Patient tolerated treatment well    Behavior During Therapy [facility] for tasks assessed/performed               Past Medical History:  Diagnosis Date   Abdominal wall hernia 03/20/2018   ATTENTION DEFICIT, 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 No mood dysfnc   Encounter for neuropsychological testing 2008   Vivien Rossetti (Neuropsy): Mixed anxiety and depression o/w normal.    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  Repeat testing 06/2015 at [facility]  Neuropsychology: Only finding was below average in rote memory auditory info encoding. No mood dysfunction.    Overweight (BMI 25.0-29.9) 10/30/2006   Qualifier: History of  By: McDiarmid MD, Todd     Prediabetes 12/21/2013   Pulmonary nodule, left 06/09/2015   Pure hypercholesterolemia 10/30/2006   ACC calculation 10-yr risk of CV event of 9.5% with recommendation to consider Statin therapy of moderate-to-high potency (12/21/13)     Past Surgical History:  Procedure Laterality Date   CATARACT EXTRACTION W/ INTRAOCULAR LENS  IMPLANT, BILATERAL     COCCYGECTOMY  1985   VASECTOMY     Patient Active Problem List   Diagnosis Date Noted   Rotator cuff tear 12/08/2020   Capsulitis of shoulder, right 11/06/2020   Shoulder injury, right, initial encounter 11/02/2020   Abdominal wall hernia 03/20/2018   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 Tamera Punt  PRECAUTIONS: Shoulder, follow [facility] protocol [facility]  SUBJECTIVE:  Pt presents to PT with no current reports of pain or discomfort.  Has been compliant with HEP with no adverse effect. Pt is ready to begin PT treatment at this time.   Pain: Are you having pain? Yes Numerical Pain Scale: 0/10 Pain Location: Rt shoulder Pain Frequency/Description: intermittent    OBJECTIVE:   *Unless otherwise noted, objective measures collected previously*  PATIENT SURVEYS:  FOTO 51%, projected 68% in 16 visits    UPPER EXTREMITY AROM/PROM:   A/PROM Right 09/08/2021 Right 09/21/2021 Right 09/24/2021  Shoulder flexion PROM: 85d, limited by dull pain  at end range  PROM: 115, limited by pt reported tightness at end range PROM: 130, limited by pt reported tightness at end range  Shoulder extension       Shoulder abduction PROM: 75d, limited by dull pain at end range  PROM: 100d, limited by pt reported dull pain at end range PROM: 105d, limited by pt reported dull pain at end range  Shoulder adduction       Shoulder internal rotation PROM: 15d PROM: 45d  PROM: 50d   Shoulder external rotation PROM: 40d PROM: 50d  PROM: 70d   Elbow flexion AROM: 145     Elbow extension AROM: -15, limited by dull shoulder pain     (Blank rows = not tested)   UPPER EXTREMITY MMT:   MMT Right 09/08/2021 Left 09/08/2021  Shoulder flexion 2+/5 mild p!    Shoulder extension      Shoulder abduction 2+/5    Shoulder ER 2+/5    Shoulder IR 2+/5    Shoulder adduction 2+/5    Middle trapezius 4/5    Lower trapezius      Elbow flexion 4/5, mild tenderness    Elbow extension 5/5    Grip strength (Lbs) 74 79  (Blank rows = not tested)                TODAY'S TREATMENT:   [facility] Adult PT Treatment:                                                DATE: 09/24/2021 Therapeutic Exercise: Seated scapular retraction 2x10 with 3-sec hold Pendulums standing x 20 cw/ccw 2# Rt Elbow flexion 3x15 2# Supine Rt shoulder ER AAROM with wooden rod 2x10 with 5-sec hold Supine Rt shoulder flexion AAROM with handhold 3x10 with 5-sec hold Manual Therapy: PROM with gentle distraction/ vibrations into shoulder flexion, abduction ER, and IR Neuromuscular re-ed: N/A  Therapeutic Activity: N/A Modalities: Game Ready Vasopneumatic device to Rt shoulder x10 minutes at 34 degrees Self Care: N/A   PATIENT EDUCATION: Education details: HEP update Person educated: Patient Education method: Explanation, demonstration, handout Education comprehension: verbalized understanding and returned demonstration     HOME EXERCISE PROGRAM: Access Code: [email or web address] URL:  [email or web address]   Exercises Seated Scapular Retraction - 2 x daily - 7 x weekly - 3 sets - 10 reps - 3 hold Seated elbow extension AROM stretch OVER KNEE - 2 x daily - 7 x weekly - 2 sets - 10 reps Seated Finger Composite Flexion with Putty - 2 x daily - 7 x weekly - 3 sets - 20 reps Seated Shoulder Pendulum Exercise - 2 x daily - 7 x weekly - 3 sets - 20 reps     ASSESSMENT:   CLINICAL IMPRESSION: Pt was able to complete all prescribed exercises with no adverse effect or increase in pain. He does  have continued discomfort and limitation in PROM of Rt shoulder abduction. All other motions are progressing well as expected. PT has continued to progress pt per protocol and pt continues to benefit from skilled PT services. HEP updated for continued Rt shoulder flexion AAROM. Will continue to progress as tolerated per POC.   REHAB POTENTIAL: Excellent   CLINICAL DECISION MAKING: Stable/uncomplicated   EVALUATION COMPLEXITY: Low     GOALS: Goals reviewed with patient? Yes   SHORT TERM GOALS:   STG Name Target Date Goal status  1 Pt will report understanding and adherence to his HEP in order to promote independence in the management of his primary impairments. Baseline: Pt reports adherence to his HEP 10/06/2021 ACHIEVED  2 Pt will achieve >120 degrees shoulder elevation PROM in order to promote WNL AROM when reaching that point in his rehab protocol Baseline: progressed from 85 degrees to 115 degrees on 09/21/2021 Final: Approx 130 degrees on 09/24/21 10/06/2021 ACHIEVED    LONG TERM GOALS:    LTG Name Target Date Goal status  1 Pt will achieve >160 degrees shoulder elevation AROM  in order to get dressed without limitation. Baseline: 11/03/2021 INITIAL  2 Pt will achieve a FOTO score of 68% in order to demonstrate improved functional ability as it relates to his shoulder impairments.  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 Baseline: 11/03/2021 INITIAL  4 Pt will demonstrate ability to lift 25 pounds with BIL UE in order to put a saddle on his horses. Baseline: 11/03/2021 INITIAL  5 Pt will achieve >50 degrees of Rt shoulder ER and IR AROM in order to put on his seatbelt with less limitation. Baseline: 11/03/2021 INITIAL    PLAN: PT FREQUENCY: 2x/week   PT DURATION: 8 weeks   PLANNED INTERVENTIONS: Therapeutic exercises, Therapeutic activity, Neuro Muscular re-education, Patient/Family education, Joint mobilization, Spinal mobilization, Cryotherapy, Taping, Vasopneumatic device, and Manual therapy   PLAN FOR NEXT SESSION: Progress shoulder PROM/ scapular and elbow strengthening in accordance to his post-op protocol; add shoulder AAROM to HEP    Ward Chatters 09/24/2021, 10:17 AM

## 2021-09-27 NOTE — Therapy (Signed)
OUTPATIENT PHYSICAL THERAPY TREATMENT NOTE   Patient Name: Christopher Wall, Christopher Wall MRN: [id number] DOB:1946-02-17, 76 y.o., male Today's Date: 09/28/2021  PCP: McDiarmid, Blane Ohara, MD REFERRING PROVIDER: Sheryle Hail, PA-C   PT End of Session - 09/28/21 1132     Visit Number 5    Number of Visits 17    Date for PT Re-Evaluation 11/03/21    Authorization Type [employer] MCR    Authorization Time Period FOTO v6, v10    Progress Note Due on Visit 10    PT Start Time 1130    PT Stop Time 1210    PT Time Calculation (min) 40 min    Activity Tolerance Patient tolerated treatment well    Behavior During Therapy [facility] for tasks assessed/performed                Past Medical History:  Diagnosis Date   Abdominal wall hernia 03/20/2018   ATTENTION DEFICIT, W/O HYPERACTIVITY 10/30/2006   Qualifier: History of  By: McDiarmid MD, Todd     Cataract    bilateral cataracts with lens implant   Chest pain, atypical 12/05/2017   COLON POLYP 02/07/2014   Tubular Adenoma x 1 - Dr Ardis Hughs (GI)   Diverticulosis of colon 07/09/2018   Based on colonoscopy 06/2108   Encounter for neuropsychological testing 06/26/2015   [facility]: Only B/L avg in rote memory auditory info encoding. No mood dysfnc   Encounter for neuropsychological testing 2008   Vivien Rossetti (Neuropsy): Mixed anxiety and depression o/w normal.    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  Repeat testing 06/2015 at [facility]  Neuropsychology: Only finding was below average in rote memory auditory info encoding. No mood dysfunction.    Overweight (BMI 25.0-29.9) 10/30/2006   Qualifier: History of  By: McDiarmid MD, Todd     Prediabetes 12/21/2013   Pulmonary nodule, left 06/09/2015   Pure hypercholesterolemia 10/30/2006   ACC calculation 10-yr risk of CV event of 9.5% with recommendation to consider Statin therapy of moderate-to-high potency (12/21/13)     Past Surgical History:  Procedure Laterality Date   CATARACT EXTRACTION W/ INTRAOCULAR LENS  IMPLANT, BILATERAL     COCCYGECTOMY  1985   VASECTOMY     Patient Active Problem List   Diagnosis Date Noted   Rotator cuff tear 12/08/2020   Capsulitis of shoulder, right 11/06/2020   Shoulder injury, right, initial encounter 11/02/2020   Abdominal wall hernia 03/20/2018   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 Tamera Punt  PRECAUTIONS: Shoulder, follow [facility] protocol in media  SUBJECTIVE:  Pt reports that his shoulder is getting better every day and that his shoulder AAROM home exercises have been going  well.  Pain: Are you having pain? Yes Numerical Pain Scale: 1/10 Pain Location: Rt shoulder Pain Frequency/Description: intermittent    OBJECTIVE:   *Unless otherwise noted, objective measures collected previously*  PATIENT SURVEYS:  FOTO 51%, projected 68% in 16 visits    UPPER EXTREMITY AROM/PROM:   A/PROM Right 09/08/2021 Right 09/21/2021 Right 09/24/2021 Right 09/28/2021  Shoulder flexion PROM: 85d, limited by dull pain at end range  PROM: 115, limited by pt reported tightness at end range PROM: 130,  limited by pt reported tightness at end range PROM: 130,  limited by pt reported tightness at end range  Shoulder extension        Shoulder abduction PROM: 75d, limited by dull pain at end range  PROM: 100d, limited by pt reported dull pain at end range PROM: 105d, limited by pt reported dull pain at end range PROM: 115d, limited by pt reported dull pain at end range  Shoulder adduction        Shoulder internal rotation PROM: 15d PROM: 45d  PROM: 50d  AROM: 60d  Shoulder external rotation PROM: 40d PROM: 50d  PROM: 70d  AROM: 65d  Elbow flexion AROM: 145      Elbow extension AROM: -15, limited by dull shoulder pain      (Blank rows = not tested)   UPPER EXTREMITY MMT:   MMT Right 09/08/2021 Left 09/08/2021  Shoulder flexion 2+/5 mild p!    Shoulder extension      Shoulder abduction 2+/5    Shoulder ER 2+/5    Shoulder IR 2+/5    Shoulder adduction 2+/5    Middle trapezius 4/5    Lower trapezius      Elbow flexion 4/5, mild tenderness    Elbow extension 5/5    Grip strength (Lbs) 74 79  (Blank rows = not tested)                TODAY'S TREATMENT:   [facility] Adult PT Treatment:                                                DATE: 09/28/2021 Therapeutic Exercise: Supine lat isometrics with 50% force 3x10 with 5-sec hold Seated shoulder rolls forward and backward 3x10 each Seated shoulder scaption AAROM with wand 3x10 with 5-sec holds at end range Seated shoulder ER AAROM with wand 3x10 with 5-sec holds at end range Seated shoulder IR AAROM with towel behind back with 5-sec at end range BIL elbow flexion 3x15 with 3# dumbbells Seated Rt shoulder pendulums with 3# dumbbell 2x20 CW/CCW Manual Therapy: N/A Neuromuscular re-ed: N/A Therapeutic Activity: N/A Modalities: N/A Self Care: N/A   [facility] Adult PT Treatment:                                                DATE: 09/24/2021 Therapeutic Exercise: Seated scapular retraction 2x10 with 3-sec hold Pendulums standing x 20 cw/ccw 2#  Rt Elbow flexion 3x15 2# Supine Rt shoulder ER AAROM with wooden rod 2x10 with 5-sec hold Supine Rt shoulder flexion AAROM with handhold 3x10 with 5-sec hold Manual Therapy: PROM with gentle distraction/ vibrations into shoulder flexion, abduction ER, and IR Neuromuscular re-ed: N/A  Therapeutic Activity: N/A Modalities: Game Ready  Vasopneumatic device to Rt shoulder x10 minutes at 34 degrees Self Care: N/A   PATIENT EDUCATION: Education details: Discussed protocol, importance of HEP adherence Person educated: Patient Education method: Explanation Education comprehension: verbalized understanding      HOME EXERCISE PROGRAM: Access Code: [email or web address] URL: [email or web address]   Exercises Seated Scapular Retraction - 2 x daily - 7 x weekly - 3 sets - 10 reps - 3 hold Seated elbow extension AROM stretch OVER KNEE - 2 x daily - 7 x weekly - 2 sets - 10 reps Seated Finger Composite Flexion with Putty - 2 x daily - 7 x weekly - 3 sets - 20 reps Seated Shoulder Pendulum Exercise - 2 x daily - 7 x weekly - 3 sets - 20 reps     ASSESSMENT:   CLINICAL IMPRESSION: Pt continues to make excellent progress with shoulder ROM in all planes. He tolerated all exercises well today with no increase in pain, only tightness in end ranges of motion. He will continue to benefit from skilled PT to address his primary impairments and return to his prior level of function with less limitation.   REHAB POTENTIAL: Excellent   CLINICAL DECISION MAKING: Stable/uncomplicated   EVALUATION COMPLEXITY: Low     GOALS: Goals reviewed with patient? Yes   SHORT TERM GOALS:   STG Name Target Date Goal status  1 Pt will report understanding and adherence to his HEP in order to promote independence in the management of his primary impairments. Baseline: Pt reports adherence to his HEP 10/06/2021 ACHIEVED  2 Pt will achieve >120 degrees shoulder elevation PROM in order to promote WNL AROM when  reaching that point in his rehab protocol Baseline: progressed from 85 degrees to 115 degrees on 09/21/2021 Final: Approx 130 degrees on 09/24/21 10/06/2021 ACHIEVED    LONG TERM GOALS:    LTG Name Target Date Goal status  1 Pt will achieve >160 degrees shoulder elevation AROM  in order to get dressed without limitation. Baseline: 11/03/2021 INITIAL  2 Pt will achieve a FOTO score of 68% in order to demonstrate improved functional ability as it relates to his shoulder impairments.  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 Baseline: 11/03/2021 INITIAL  4 Pt will demonstrate ability to lift 25 pounds with BIL UE in order to put a saddle on his horses. Baseline: 11/03/2021 INITIAL  5 Pt will achieve >50 degrees of Rt shoulder ER and IR AROM in order to put on his seatbelt with less limitation. Baseline: Achieved 09/28/2021 11/03/2021 ACHIEVED    PLAN: PT FREQUENCY: 2x/week   PT DURATION: 8 weeks   PLANNED INTERVENTIONS: Therapeutic exercises, Therapeutic activity, Neuro Muscular re-education, Patient/Family education, Joint mobilization, Spinal mobilization, Cryotherapy, Taping, Vasopneumatic device, and Manual therapy   PLAN FOR NEXT SESSION: Progress shoulder PROM/ scapular and elbow strengthening in accordance to his post-op protocol; add shoulder AAROM to HEP    Vanessa [place], PT, DPT 09/28/21 12:07 PM

## 2021-09-28 ENCOUNTER — Ambulatory Visit: Payer: Medicare HMO

## 2021-09-28 ENCOUNTER — Other Ambulatory Visit: Payer: Self-pay

## 2021-09-28 DIAGNOSIS — M25511 Pain in right shoulder: Secondary | ICD-10-CM

## 2021-09-28 DIAGNOSIS — G8929 Other chronic pain: Secondary | ICD-10-CM | POA: Diagnosis not present

## 2021-09-28 DIAGNOSIS — M6281 Muscle weakness (generalized): Secondary | ICD-10-CM

## 2021-09-28 DIAGNOSIS — M25611 Stiffness of right shoulder, not elsewhere classified: Secondary | ICD-10-CM | POA: Diagnosis not present

## 2021-10-01 ENCOUNTER — Other Ambulatory Visit: Payer: Self-pay

## 2021-10-01 ENCOUNTER — Ambulatory Visit: Payer: Medicare HMO

## 2021-10-01 DIAGNOSIS — G8929 Other chronic pain: Secondary | ICD-10-CM | POA: Diagnosis not present

## 2021-10-01 DIAGNOSIS — M6281 Muscle weakness (generalized): Secondary | ICD-10-CM | POA: Diagnosis not present

## 2021-10-01 DIAGNOSIS — M25511 Pain in right shoulder: Secondary | ICD-10-CM

## 2021-10-01 DIAGNOSIS — M25611 Stiffness of right shoulder, not elsewhere classified: Secondary | ICD-10-CM

## 2021-10-01 NOTE — Therapy (Signed)
OUTPATIENT PHYSICAL THERAPY TREATMENT NOTE   Patient Name: Christopher Wall, DDS MRN: [id number] DOB:10-20-1945, 76 y.o., male Today's Date: 10/01/2021  PCP: McDiarmid, Blane Ohara, MD REFERRING PROVIDER: McDiarmid, Blane Ohara, MD   PT End of Session - 10/01/21 1045     Visit Number 6    Number of Visits 17    Date for PT Re-Evaluation 11/03/21    Authorization Type Aet[employer] MCR    Authorization Time Period FOTO v6, v10    Progress Note Due on Visit 10    PT Start Time 1045    PT Stop Time 1123    PT Time Calculation (min) 38 min    Activity Tolerance Patient tolerated treatment well    Behavior During Therapy [facility] for tasks assessed/performed                 Past Medical History:  Diagnosis Date   Abdominal wall hernia 03/20/2018   ATTENTION DEFICIT, W/O HYPERACTIVITY 10/30/2006   Qualifier: History of  By: McDiarmid MD, Todd     Cataract    bilateral cataracts with lens implant   Chest pain, atypical 12/05/2017   COLON POLYP 02/07/2014   Tubular Adenoma x 1 - Dr Ardis Hughs (GI)   Diverticulosis of colon 07/09/2018   Based on colonoscopy 06/2108   Encounter for neuropsychological testing 06/26/2015   [facility]: Only B/L avg in rote memory auditory info encoding. No mood dysfnc   Encounter for neuropsychological testing 2008   Vivien Rossetti (Neuropsy): Mixed anxiety and depression o/w normal.    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  Repeat testing 06/2015 at [facility]  Neuropsychology: Only finding was below average in rote memory auditory info encoding. No mood dysfunction.    Overweight (BMI 25.0-29.9) 10/30/2006   Qualifier: History of  By: McDiarmid MD, Todd     Prediabetes 12/21/2013   Pulmonary nodule, left 06/09/2015   Pure hypercholesterolemia 10/30/2006   ACC calculation 10-yr risk of CV event of 9.5% with recommendation to consider Statin therapy of moderate-to-high potency (12/21/13)     Past Surgical History:  Procedure Laterality Date   CATARACT EXTRACTION W/ INTRAOCULAR LENS  IMPLANT, BILATERAL     COCCYGECTOMY  1985   VASECTOMY     Patient Active Problem List   Diagnosis Date Noted   Rotator cuff tear 12/08/2020   Capsulitis of shoulder, right 11/06/2020   Shoulder injury, right, initial encounter 11/02/2020   Abdominal wall hernia 03/20/2018   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 Tamera Punt  PRECAUTIONS: Shoulder, follow [facility] protocol in media  SUBJECTIVE:  Pt presents to PT with reports of slight Rt shoulder  pain. Pt had a positive follow up visit with Dr. Tamera Punt early today who said he is doing well post surgery per pt report. Has continued compliance with HEP with no adverse effect. Pt is ready to begin PT at this time.   Pain: Are you having pain? Yes Numerical Pain Scale: 1/10 Pain Location: Rt shoulder Pain Frequency/Description: intermittent    OBJECTIVE:   *Unless otherwise noted, objective measures collected previously*  PATIENT SURVEYS:  FOTO 59% on 10/01/2021    UPPER EXTREMITY AROM/PROM:   A/PROM  Right 09/08/2021 Right 09/21/2021 Right 09/24/2021 Right 09/28/2021  Shoulder flexion PROM: 85d, limited by dull pain at end range  PROM: 115, limited by pt reported tightness at end range PROM: 130, limited by pt reported tightness at end range PROM: 130,  limited by pt reported tightness at end range  Shoulder extension        Shoulder abduction PROM: 75d, limited by dull pain at end range  PROM: 100d, limited by pt reported dull pain at end range PROM: 105d, limited by pt reported dull pain at end range PROM: 115d, limited by pt reported dull pain at end range  Shoulder adduction        Shoulder internal rotation PROM: 15d PROM: 45d  PROM: 50d  AROM: 60d  Shoulder external rotation PROM: 40d PROM: 50d  PROM: 70d  AROM: 65d  Elbow flexion AROM: 145      Elbow extension AROM: -15, limited by dull shoulder pain      (Blank rows = not tested)   UPPER EXTREMITY MMT:   MMT Right 09/08/2021 Left 09/08/2021  Shoulder flexion 2+/5 mild p!    Shoulder extension      Shoulder abduction 2+/5    Shoulder ER 2+/5    Shoulder IR 2+/5    Shoulder adduction 2+/5    Middle trapezius 4/5    Lower trapezius      Elbow flexion 4/5, mild tenderness    Elbow extension 5/5    Grip strength (Lbs) 74 79  (Blank rows = not tested)                TODAY'S TREATMENT:   [facility] Adult PT Treatment:                                                DATE: 10/01/2021 Therapeutic Exercise: Standing Rt shoulder IR/ER isometric 2x10 5-sec hold Standing row 3x10 GTB Standing Rt shoulder pendulums with 3# dumbbell 2x20 CW/CCW Seated shoulder scaption AAROM with wand 3x10 with 5-sec holds at end range Seated shoulder ER AAROM with wand 3x10 with 5-sec holds at end range Seated shoulder IR AAROM with towel behind back 3x10 with 5-sec at end range Seated Rt elbow flexion 3x15 with 3# dumbbells Supine cane flexion AAROM 3x10 3-sec hold Supine lat isometrics with 50% force 3x10 with 5-sec hold Manual Therapy: PROM Rt  shoulder flex/abd Neuromuscular re-ed: N/A Therapeutic Activity: N/A Modalities: N/A Self Care: N/A  [facility] Adult PT Treatment:                                                DATE: 09/28/2021 Therapeutic Exercise: Supine lat isometrics with 50% force 3x10 with 5-sec hold Seated  shoulder rolls forward and backward 3x10 each Seated shoulder scaption AAROM with wand 3x10 with 5-sec holds at end range Seated shoulder ER AAROM with wand 3x10 with 5-sec holds at end range Seated shoulder IR AAROM with towel behind back with 5-sec at end range BIL elbow flexion 3x15 with 3# dumbbells Seated Rt shoulder pendulums with 3# dumbbell 2x20 CW/CCW Manual Therapy: N/A Neuromuscular re-ed: N/A Therapeutic Activity: N/A Modalities: N/A Self Care: N/A   [facility] Adult PT Treatment:                                                DATE: 09/24/2021 Therapeutic Exercise: Seated scapular retraction 2x10 with 3-sec hold Pendulums standing x 20 cw/ccw 2# Rt Elbow flexion 3x15 2# Supine Rt shoulder ER AAROM with wooden rod 2x10 with 5-sec hold Supine Rt shoulder flexion AAROM with handhold 3x10 with 5-sec hold Manual Therapy: PROM with gentle distraction/ vibrations into shoulder flexion, abduction ER, and IR Neuromuscular re-ed: N/A  Therapeutic Activity: N/A Modalities: Game Ready Vasopneumatic device to Rt shoulder x10 minutes at 34 degrees Self Care: N/A   PATIENT EDUCATION: Education details: Discussed protocol, importance of HEP adherence Person educated: Patient Education method: Explanation Education comprehension: verbalized understanding      HOME EXERCISE PROGRAM: Access Code: [email or web address] URL: [email or web address]   Exercises Seated Scapular Retraction - 2 x daily - 7 x weekly - 3 sets - 10 reps - 3 hold Seated elbow extension AROM stretch OVER KNEE - 2 x daily - 7 x weekly - 2 sets - 10 reps Seated Finger Composite Flexion with Putty - 2 x daily - 7 x weekly - 3  sets - 20 reps Seated Shoulder Pendulum Exercise - 2 x daily - 7 x weekly - 3 sets - 20 reps     ASSESSMENT:   CLINICAL IMPRESSION: Pt was able to complete all prescribed exercises with no adverse effect or increase in pain. He is now over 6-weeks post op and is progressing well with therapy. Light RTC isometrics were initiated today per protocol, with continued progression of AAROM exercises tolerated well by pt. He does continue to have limitation in Rt shoulder abduction passively, but has continue to progression well with this motion. Pt's FOTO score also increased, indicating subjective improvement in functional ability. Will continue to progress as tolerated per protocol.    GOALS: Goals reviewed with patient? Yes   SHORT TERM GOALS:   STG Name Target Date Goal status  1 Pt will report understanding and adherence to his HEP in order to promote independence in the management of his primary impairments. Baseline: Pt reports adherence to his HEP 10/06/2021 ACHIEVED  2 Pt will achieve >120 degrees shoulder elevation PROM in order to promote WNL AROM when reaching that point in his rehab protocol Baseline: progressed from 85 degrees to 115 degrees on 09/21/2021 Final: Approx 130 degrees on 09/24/21 10/06/2021 ACHIEVED    LONG TERM GOALS:    LTG Name Target Date Goal status  1 Pt will achieve >160 degrees shoulder elevation AROM  in order to get dressed without limitation. Baseline: 11/03/2021 ONGOING  2 Pt will achieve a FOTO score of 68% in order to demonstrate improved functional ability as it relates to his shoulder impairments.  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 Baseline: 11/03/2021 ONGOING  4 Pt will demonstrate ability to lift 25 pounds with BIL UE in order to put a saddle on his horses. Baseline: 11/03/2021 ONGOING  5 Pt will  achieve >50 degrees of Rt shoulder ER and IR AROM in order to put on his seatbelt with less limitation. Baseline: Achieved 09/28/2021 11/03/2021 ACHIEVED    PLAN: PT FREQUENCY: 2x/week   PT DURATION: 8 weeks   PLANNED INTERVENTIONS: Therapeutic exercises, Therapeutic activity, Neuro Muscular re-education, Patient/Family education, Joint mobilization, Spinal mobilization, Cryotherapy, Taping, Vasopneumatic device, and Manual therapy   PLAN FOR NEXT SESSION: Progress shoulder PROM/ scapular and elbow strengthening in accordance to his post-op protocol; add shoulder AAROM to HEP    Ward Chatters, PT 10/01/21 11:25 AM

## 2021-10-04 NOTE — Therapy (Signed)
OUTPATIENT PHYSICAL THERAPY TREATMENT NOTE   Patient Name: Christopher Wall, Christopher Wall MRN: [id number] DOB:[birth year], 76 y.o., male Today's Date: 10/05/2021  PCP: McDiarmid, Blane Ohara, MD REFERRING PROVIDER: Sheryle Hail, PA-C   PT End of Session - 10/05/21 1128     Visit Number 7    Number of Visits 17    Date for PT Re-Evaluation 11/03/21    Authorization Type [employer] MCR    Authorization Time Period FOTO v6, v10    Progress Note Due on Visit 10    PT Start Time 1130    PT Stop Time 1208    PT Time Calculation (min) 38 min    Activity Tolerance Patient tolerated treatment well    Behavior During Therapy [facility] for tasks assessed/performed                  Past Medical History:  Diagnosis Date   Abdominal wall hernia 03/20/2018   ATTENTION DEFICIT, W/O HYPERACTIVITY 10/30/2006   Qualifier: History of  By: McDiarmid MD, Todd     Cataract    bilateral cataracts with lens implant   Chest pain, atypical 12/05/2017   COLON POLYP 02/07/2014   Tubular Adenoma x 1 - Dr Ardis Hughs (GI)   Diverticulosis of colon 07/09/2018   Based on colonoscopy 06/2108   Encounter for neuropsychological testing 06/26/2015   [facility]: Only B/L avg in rote memory auditory info encoding. No mood dysfnc   Encounter for neuropsychological testing 2008   Vivien Rossetti (Neuropsy): Mixed anxiety and depression o/w normal.    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  Repeat testing 06/2015 [facility]  Neuropsychology: Only finding was below average in rote memory auditory info encoding. No mood dysfunction.    Overweight (BMI 25.0-29.9) 10/30/2006   Qualifier: History of  By: McDiarmid MD, Todd     Prediabetes 12/21/2013   Pulmonary nodule, left 06/09/2015   Pure hypercholesterolemia 10/30/2006   ACC calculation 10-yr risk of CV event of 9.5% with recommendation to consider Statin therapy of moderate-to-high potency (12/21/13)     Past Surgical History:  Procedure Laterality Date   CATARACT EXTRACTION W/ INTRAOCULAR LENS  IMPLANT, BILATERAL     COCCYGECTOMY  1985   VASECTOMY     Patient Active Problem List   Diagnosis Date Noted   Rotator cuff tear 12/08/2020   Capsulitis of shoulder, right 11/06/2020   Shoulder injury, right, initial encounter 11/02/2020   Abdominal wall hernia 03/20/2018   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 Tamera Punt  PRECAUTIONS: Shoulder, follow [place] protocol in media  SUBJECTIVE:  Pt reports continued daily improvement in his symptoms, stating he can tell he can lift his hand higher  overhead. He adds that he has continued to perform his HEP daily.  Pain: Are you having pain? Yes Numerical Pain Scale: 1/10 Pain Location: Rt shoulder Pain Frequency/Description: intermittent    OBJECTIVE:   *Unless otherwise noted, objective measures collected previously*  PATIENT SURVEYS:  FOTO 59% on 10/01/2021    UPPER EXTREMITY AROM/PROM:   A/PROM Right 09/08/2021 Right 09/21/2021 Right 09/24/2021 Right 09/28/2021 Right 10/05/2021  Shoulder flexion PROM: 85d, limited by dull pain at end range  PROM: 115, limited  by pt reported tightness at end range PROM: 130, limited by pt reported tightness at end range PROM: 130,  limited by pt reported tightness at end range AROM: 145  Shoulder extension         Shoulder abduction PROM: 75d, limited by dull pain at end range  PROM: 100d, limited by pt reported dull pain at end range PROM: 105d, limited by pt reported dull pain at end range PROM: 115d, limited by pt reported dull pain at end range AROM: 160 mild pain at end range  Shoulder adduction         Shoulder internal rotation PROM: 15d PROM: 45d  PROM: 50d  AROM: 60d AROM: 55d  Shoulder external rotation PROM: 40d PROM: 50d  PROM: 70d  AROM: 65d AROM: 53d  Elbow flexion AROM: 145       Elbow extension AROM: -15, limited by dull shoulder pain       (Blank rows = not tested)   UPPER EXTREMITY MMT:   MMT Right 09/08/2021 Left 09/08/2021 Right 10/05/2021  Shoulder flexion 2+/5 mild p!   4+/5 mild tightness  Shoulder extension       Shoulder abduction 2+/5   5/5 mild p!  Shoulder ER 2+/5   4/5 mildp!  Shoulder IR 2+/5   5/5  Shoulder adduction 2+/5     Middle trapezius 4/5     Lower trapezius       Elbow flexion 4/5, mild tenderness   5/5  Elbow extension 5/5     Grip strength (Lbs) 74 79   (Blank rows = not tested)                TODAY'S TREATMENT:   [facility] Adult PT Treatment:                                                DATE: 10/05/2021 Therapeutic Exercise: Standing Rt shoulder flexion 50% isometric into wall 2x10 with 5-sec hold Standing Rt shoulder abduction 50% isometric into wall 2x10 with 5-sec hold Standing Rt shoulder ER 50% isometric into wall 2x10 with 5-sec hold Standing Rt shoulder IR 50% isometric into wall 2x10 with 5-sec hold Seated PNF D2 shoulder flexion 3x10 Seated forward/backward shoulder rolls 2x10 each Supine Rt shoulder extension 50% isometric into mat table 2x10 with 5-sec hold Manual Therapy: PROM Rt shoulder flex/abd with gentle oscillation and distraction Neuromuscular  re-ed: N/A Therapeutic Activity: N/A Modalities: N/A Self Care: N/A   [facility] Adult PT Treatment:                                                DATE: 10/01/2021 Therapeutic Exercise: Standing Rt shoulder IR/ER isometric 2x10 5-sec hold Standing row 3x10 GTB  Standing Rt shoulder pendulums with 3# dumbbell 2x20 CW/CCW Seated shoulder scaption AAROM with wand 3x10 with 5-sec holds at end range Seated shoulder ER AAROM with wand 3x10 with 5-sec holds at end range Seated shoulder IR AAROM with towel behind back 3x10 with 5-sec at end range Seated Rt elbow flexion 3x15 with 3# dumbbells Supine cane flexion AAROM 3x10 3-sec hold Supine lat isometrics with 50% force 3x10 with 5-sec hold Manual Therapy: PROM Rt shoulder flex/abd Neuromuscular re-ed: N/A Therapeutic Activity: N/A Modalities: N/A Self Care: N/A  [facility] Adult PT Treatment:                                                DATE: 09/28/2021 Therapeutic Exercise: Supine lat isometrics with 50% force 3x10 with 5-sec hold Seated shoulder rolls forward and backward 3x10 each Seated shoulder scaption AAROM with wand 3x10 with 5-sec holds at end range Seated shoulder ER AAROM with wand 3x10 with 5-sec holds at end range Seated shoulder IR AAROM with towel behind back with 5-sec at end range BIL elbow flexion 3x15 with 3# dumbbells Seated Rt shoulder pendulums with 3# dumbbell 2x20 CW/CCW Manual Therapy: N/A Neuromuscular re-ed: N/A Therapeutic Activity: N/A Modalities: N/A Self Care: N/A    PATIENT EDUCATION: Education details: Updated HEP, educated on progression into next phase of post-op protocol Person educated: Patient Education method: Explanation Education comprehension: verbalized understanding      HOME EXERCISE PROGRAM: Access Code: [email or web address] URL: [email or web address] Date: 10/05/2021 Prepared by: Vanessa Nanuet  Exercises Seated Scapular Retraction - 2 x daily - 7 x weekly - 3 sets -  10 reps - 3 hold Seated elbow extension AROM stretch OVER KNEE - 2 x daily - 7 x weekly - 2 sets - 10 reps Seated Finger Composite Flexion with Putty - 2 x daily - 7 x weekly - 3 sets - 20 reps Seated Shoulder Pendulum Exercise - 2 x daily - 7 x weekly - 3 sets - 20 reps Supine Shoulder Flexion AAROM - 2 x daily - 7 x weekly - 3 sets - 10 reps - 5 sec hold  Added 10/05/2021: Isometric Shoulder Flexion at Wall - 1 x daily - 7 x weekly - 2 sets - 10 reps - 5-sec hold Standing Isometric Shoulder Internal Rotation at Doorway - 1 x daily - 7 x weekly - 2 sets - 10 reps - 5-sec hold Isometric Shoulder External Rotation at Wall - 1 x daily - 7 x weekly - 2 sets - 10 reps - 5-sec hold Isometric Shoulder Abduction at Wall - 1 x daily - 7 x weekly - 2 sets - 10 reps - 5-sec hold Shoulder PNF D2 Flexion - 1 x daily - 7 x weekly - 3 sets - 10 reps    ASSESSMENT:   CLINICAL IMPRESSION: Upon re-assessment, the pt has made excellent progress in Rt shoulder flexion and abduction AROM, nearly WNL for both. He has also made excellent strength gains. He tolerated all new exercises today well, demonstrating good form and no increase in pain with selected interventions. He also reports a therapeutic response to end range PROM with gentle vibration/ distraction. He will continue to benefit from skilled PT to address his primary impairments and return to his prior level of function with less limitation.    GOALS: Goals reviewed with patient? Yes   SHORT TERM  GOALS:   STG Name Target Date Goal status  1 Pt will report understanding and adherence to his HEP in order to promote independence in the management of his primary impairments. Baseline: Pt reports adherence to his HEP 10/06/2021 ACHIEVED  2 Pt will achieve >120 degrees shoulder elevation PROM in order to promote WNL AROM when reaching that point in his rehab protocol Baseline: progressed from 85 degrees to 115 degrees on 09/21/2021 Final: Approx 130 degrees  on 09/24/21 10/06/2021 ACHIEVED    LONG TERM GOALS:    LTG Name Target Date Goal status  1 Pt will achieve >160 degrees shoulder elevation AROM  in order to get dressed without limitation. 10/05/2021: 145d 11/03/2021 ONGOING  2 Pt will achieve a FOTO score of 68% in order to demonstrate improved functional ability as it relates to his shoulder impairments.  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 Baseline: 11/03/2021 ONGOING  5 Pt will achieve >50 degrees of Rt shoulder ER and IR AROM in order to put on his seatbelt with less limitation. Baseline: Achieved 09/28/2021 11/03/2021 ACHIEVED    PLAN: PT FREQUENCY: 2x/week   PT DURATION: 8 weeks   PLANNED INTERVENTIONS: Therapeutic exercises, Therapeutic activity, Neuro Muscular re-education, Patient/Family education, Joint mobilization, Spinal mobilization, Cryotherapy, Taping, Vasopneumatic device, and Manual therapy   PLAN FOR NEXT SESSION: Progress shoulder PROM/ scapular and elbow strengthening in accordance to his post-op protocol; add shoulder AAROM to HEP    Vanessa Latta, PT, DPT 10/05/21 12:09 PM

## 2021-10-05 ENCOUNTER — Other Ambulatory Visit: Payer: Self-pay

## 2021-10-05 ENCOUNTER — Ambulatory Visit: Payer: Medicare HMO | Attending: Surgical

## 2021-10-05 DIAGNOSIS — M25511 Pain in right shoulder: Secondary | ICD-10-CM | POA: Insufficient documentation

## 2021-10-05 DIAGNOSIS — M25611 Stiffness of right shoulder, not elsewhere classified: Secondary | ICD-10-CM | POA: Insufficient documentation

## 2021-10-05 DIAGNOSIS — G8929 Other chronic pain: Secondary | ICD-10-CM | POA: Diagnosis not present

## 2021-10-05 DIAGNOSIS — M6281 Muscle weakness (generalized): Secondary | ICD-10-CM | POA: Diagnosis not present

## 2021-10-08 ENCOUNTER — Other Ambulatory Visit: Payer: Self-pay

## 2021-10-08 ENCOUNTER — Ambulatory Visit: Payer: Medicare HMO

## 2021-10-08 DIAGNOSIS — M25611 Stiffness of right shoulder, not elsewhere classified: Secondary | ICD-10-CM | POA: Diagnosis not present

## 2021-10-08 DIAGNOSIS — M25511 Pain in right shoulder: Secondary | ICD-10-CM | POA: Diagnosis not present

## 2021-10-08 DIAGNOSIS — M6281 Muscle weakness (generalized): Secondary | ICD-10-CM

## 2021-10-08 DIAGNOSIS — G8929 Other chronic pain: Secondary | ICD-10-CM | POA: Diagnosis not present

## 2021-10-08 NOTE — Therapy (Signed)
OUTPATIENT PHYSICAL THERAPY TREATMENT NOTE   Patient Name: Christopher Wall, DDS MRN: [id number] DOB:10/16/1945, 76 y.o., male Today's Date: 10/08/2021  PCP: McDiarmid, Blane Ohara, MD REFERRING PROVIDER: McDiarmid, Blane Ohara, MD   PT End of Session - 10/08/21 1444     Visit Number 8    Number of Visits 17    Date for PT Re-Evaluation 11/03/21    Authorization Type Aet[employer] MCR    Authorization Time Period FOTO v6, v10    Progress Note Due on Visit 10    PT Start Time 1444    PT Stop Time 1523    PT Time Calculation (min) 39 min    Activity Tolerance Patient tolerated treatment well    Behavior During Therapy [facility] for tasks assessed/performed                   Past Medical History:  Diagnosis Date   Abdominal wall hernia 03/20/2018   ATTENTION DEFICIT, W/O HYPERACTIVITY 10/30/2006   Qualifier: History of  By: McDiarmid MD, Todd     Cataract    bilateral cataracts with lens implant   Chest pain, atypical 12/05/2017   COLON POLYP 02/07/2014   Tubular Adenoma x 1 - Dr Ardis Hughs (GI)   Diverticulosis of colon 07/09/2018   Based on colonoscopy 06/2108   Encounter for neuropsychological testing 06/26/2015   [facility]: Only B/L avg in rote memory auditory info encoding. No mood dysfnc   Encounter for neuropsychological testing 2008   Vivien Rossetti (Neuropsy): Mixed anxiety and depression o/w normal.    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  Repeat testing 06/2015 at [facility]  Neuropsychology: Only finding was below average in rote memory auditory info encoding. No mood dysfunction.    Overweight (BMI 25.0-29.9) 10/30/2006   Qualifier: History of  By: McDiarmid MD, Todd     Prediabetes 12/21/2013   Pulmonary nodule, left 06/09/2015   Pure hypercholesterolemia 10/30/2006   ACC calculation 10-yr risk of CV event of 9.5% with recommendation to consider Statin therapy of moderate-to-high potency (12/21/13)     Past Surgical History:  Procedure Laterality Date   CATARACT EXTRACTION W/ INTRAOCULAR LENS  IMPLANT, BILATERAL     COCCYGECTOMY  1985   VASECTOMY     Patient Active Problem List   Diagnosis Date Noted   Rotator cuff tear 12/08/2020   Capsulitis of shoulder, right 11/06/2020   Shoulder injury, right, initial encounter 11/02/2020   Abdominal wall hernia 03/20/2018   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 Tamera Punt  PRECAUTIONS: Shoulder, follow [facility] protocol in media  SUBJECTIVE:  Pt presents to PT with reports of slight Rt shoulder pain, thinks he may have slept  on it last night. Has been compliant with HEP with no adverse effect. He is ready to begin PT at this time.   Pain: Are you having pain? Yes Numerical Pain Scale: 3/10 Pain Location: Rt shoulder Pain Frequency/Description: intermittent    OBJECTIVE:   *Unless otherwise noted, objective measures collected previously*  PATIENT SURVEYS:  FOTO 59% on 10/01/2021    UPPER EXTREMITY AROM/PROM:   A/PROM Right 09/08/2021 Right 09/21/2021 Right 09/24/2021 Right 09/28/2021 Right 10/05/2021  Shoulder flexion PROM: 85d, limited by dull pain at end  range  PROM: 115, limited by pt reported tightness at end range PROM: 130, limited by pt reported tightness at end range PROM: 130,  limited by pt reported tightness at end range AROM: 145  Shoulder extension         Shoulder abduction PROM: 75d, limited by dull pain at end range  PROM: 100d, limited by pt reported dull pain at end range PROM: 105d, limited by pt reported dull pain at end range PROM: 115d, limited by pt reported dull pain at end range AROM: 160 mild pain at end range  Shoulder adduction         Shoulder internal rotation PROM: 15d PROM: 45d  PROM: 50d  AROM: 60d AROM: 55d  Shoulder external rotation PROM: 40d PROM: 50d  PROM: 70d  AROM: 65d AROM: 53d  Elbow flexion AROM: 145       Elbow extension AROM: -15, limited by dull shoulder pain       (Blank rows = not tested)   UPPER EXTREMITY MMT:   MMT Right 09/08/2021 Left 09/08/2021 Right 10/05/2021  Shoulder flexion 2+/5 mild p!   4+/5 mild tightness  Shoulder extension       Shoulder abduction 2+/5   5/5 mild p!  Shoulder ER 2+/5   4/5 mildp!  Shoulder IR 2+/5   5/5  Shoulder adduction 2+/5     Middle trapezius 4/5     Lower trapezius       Elbow flexion 4/5, mild tenderness   5/5  Elbow extension 5/5     Grip strength (Lbs) 74 79   (Blank rows = not tested)                TODAY'S TREATMENT:   [facility] Adult PT Treatment:                                                DATE: 10/08/2021 Therapeutic Exercise: Standing Rt shoulder flexion 50% isometric into wall 2x10 with 5-sec hold Standing Rt shoulder abduction 50% isometric into wall 2x10 with 5-sec hold Standing Rt shoulder ER 50% isometric into wall 3x10 with 5-sec hold Standing Rt shoulder IR 50% isometric into wall 3x10 with 5-sec hold Row 3x10 RTB Supine shoulder flexion with pball AAROM 2x10 Seated shoulder ER AAROM with wand 3x10 with 5-sec holds at end range Supine Rt shoulder extension 50% isometric into mat table 2x10 with 5-sec hold Manual Therapy: PROM Rt  shoulder flex/abd with gentle oscillation and distraction Neuromuscular re-ed: N/A Therapeutic Activity: N/A Modalities: N/A Self Care: N/A  [facility] Adult PT Treatment:  DATE: 10/05/2021 Therapeutic Exercise: Standing Rt shoulder flexion 50% isometric into wall 2x10 with 5-sec hold Standing Rt shoulder abduction 50% isometric into wall 2x10 with 5-sec hold Standing Rt shoulder ER 50% isometric into wall 2x10 with 5-sec hold Standing Rt shoulder IR 50% isometric into wall 2x10 with 5-sec hold Seated PNF D2 shoulder flexion 3x10 Seated forward/backward shoulder rolls 2x10 each Supine Rt shoulder extension 50% isometric into mat table 2x10 with 5-sec hold Manual Therapy: PROM Rt shoulder flex/abd with gentle oscillation and distraction Neuromuscular re-ed: N/A Therapeutic Activity: N/A Modalities: N/A Self Care: N/A   [facility] Adult PT Treatment:                                                DATE: 10/01/2021 Therapeutic Exercise: Standing Rt shoulder IR/ER isometric 2x10 5-sec hold Standing row 3x10 GTB Standing Rt shoulder pendulums with 3# dumbbell 2x20 CW/CCW Seated shoulder scaption AAROM with wand 3x10 with 5-sec holds at end range Seated shoulder ER AAROM with wand 3x10 with 5-sec holds at end range Seated shoulder IR AAROM with towel behind back 3x10 with 5-sec at end range Seated Rt elbow flexion 3x15 with 3# dumbbells Supine cane flexion AAROM 3x10 3-sec hold Supine lat isometrics with 50% force 3x10 with 5-sec hold Manual Therapy: PROM Rt shoulder flex/abd Neuromuscular re-ed: N/A Therapeutic Activity: N/A Modalities: N/A Self Care: N/A   PATIENT EDUCATION: Education details: Updated HEP, educated on progression into next phase of post-op protocol Person educated: Patient Education method: Explanation Education comprehension: verbalized understanding      HOME EXERCISE PROGRAM: Access Code: [email or web address] URL:  [email or web address] Date: 10/05/2021 Prepared by: Vanessa   Exercises Seated Scapular Retraction - 2 x daily - 7 x weekly - 3 sets - 10 reps - 3 hold Seated elbow extension AROM stretch OVER KNEE - 2 x daily - 7 x weekly - 2 sets - 10 reps Seated Finger Composite Flexion with Putty - 2 x daily - 7 x weekly - 3 sets - 20 reps Seated Shoulder Pendulum Exercise - 2 x daily - 7 x weekly - 3 sets - 20 reps Supine Shoulder Flexion AAROM - 2 x daily - 7 x weekly - 3 sets - 10 reps - 5 sec hold  Added 10/05/2021: Isometric Shoulder Flexion at Wall - 1 x daily - 7 x weekly - 2 sets - 10 reps - 5-sec hold Standing Isometric Shoulder Internal Rotation at Doorway - 1 x daily - 7 x weekly - 2 sets - 10 reps - 5-sec hold Isometric Shoulder External Rotation at Wall - 1 x daily - 7 x weekly - 2 sets - 10 reps - 5-sec hold Isometric Shoulder Abduction at Wall - 1 x daily - 7 x weekly - 2 sets - 10 reps - 5-sec hold Shoulder PNF D2 Flexion - 1 x daily - 7 x weekly - 3 sets - 10 reps    ASSESSMENT:   CLINICAL IMPRESSION: Pt was able to complete all prescribed exercises with no adverse effect. He continues to progress very well, with near normal limits PROM for Rt shoulder flexion. Does continue to have limitation with Rt shoulder abduction passively, but overall is progressing well in this area. Therapy also progressed isometrics and will soon progress to AROM per protocol. Will otherwise continue to progress as tolerated per POC.     GOALS:  Goals reviewed with patient? Yes   SHORT TERM GOALS:   STG Name Target Date Goal status  1 Pt will report understanding and adherence to his HEP in order to promote independence in the management of his primary impairments. Baseline: Pt reports adherence to his HEP 10/06/2021 ACHIEVED  2 Pt will achieve >120 degrees shoulder elevation PROM in order to promote WNL AROM when reaching that point in his rehab protocol Baseline: progressed from 85  degrees to 115 degrees on 09/21/2021 Final: Approx 130 degrees on 09/24/21 10/06/2021 ACHIEVED    LONG TERM GOALS:    LTG Name Target Date Goal status  1 Pt will achieve >160 degrees shoulder elevation AROM  in order to get dressed without limitation. 10/05/2021: 145d 11/03/2021 ONGOING  2 Pt will achieve a FOTO score of 68% in order to demonstrate improved functional ability as it relates to his shoulder impairments.  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 Baseline: 11/03/2021 ONGOING  5 Pt will achieve >50 degrees of Rt shoulder ER and IR AROM in order to put on his seatbelt with less limitation. Baseline: Achieved 09/28/2021 11/03/2021 ACHIEVED    PLAN: PT FREQUENCY: 2x/week   PT DURATION: 8 weeks   PLANNED INTERVENTIONS: Therapeutic exercises, Therapeutic activity, Neuro Muscular re-education, Patient/Family education, Joint mobilization, Spinal mobilization, Cryotherapy, Taping, Vasopneumatic device, and Manual therapy   PLAN FOR NEXT SESSION: Progress shoulder PROM/ scapular and elbow strengthening in accordance to his post-op protocol; add shoulder AAROM to HEP   Ward Chatters, PT 10/08/21 3:25 PM

## 2021-10-11 NOTE — Therapy (Signed)
OUTPATIENT PHYSICAL THERAPY TREATMENT NOTE   Patient Name: Christopher Wall, Christopher Wall MRN: [id number] DOB:[birth year], 76 y.o., male Today's Date: 10/12/2021  PCP: McDiarmid, Blane Ohara, MD REFERRING PROVIDER: Sheryle Hail, PA-C   PT End of Session - 10/12/21 0959     Visit Number 9    Number of Visits 17    Date for PT Re-Evaluation 11/03/21    Authorization Type [employer] MCR    Authorization Time Period FOTO v6, v10    Progress Note Due on Visit 10    PT Start Time 1000    PT Stop Time 1045    PT Time Calculation (min) 45 min    Activity Tolerance Patient tolerated treatment well    Behavior During Therapy [facility] for tasks assessed/performed                    Past Medical History:  Diagnosis Date   Abdominal wall hernia 03/20/2018   ATTENTION DEFICIT, W/O HYPERACTIVITY 10/30/2006   Qualifier: History of  By: McDiarmid MD, Todd     Cataract    bilateral cataracts with lens implant   Chest pain, atypical 12/05/2017   COLON POLYP 02/07/2014   Tubular Adenoma x 1 - Dr Ardis Hughs (GI)   Diverticulosis of colon 07/09/2018   Based on colonoscopy 06/2108   Encounter for neuropsychological testing 06/26/2015   [facility]: Only B/L avg in rote memory auditory info encoding. No mood dysfnc   Encounter for neuropsychological testing 2008   Vivien Rossetti (Neuropsy): Mixed anxiety and depression o/w normal.    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  Repeat testing 06/2015 at [facility]  Neuropsychology: Only finding was below average in rote memory auditory info encoding. No mood dysfunction.    Overweight (BMI 25.0-29.9) 10/30/2006   Qualifier: History of  By: McDiarmid MD, Todd     Prediabetes 12/21/2013   Pulmonary nodule, left 06/09/2015   Pure hypercholesterolemia 10/30/2006   ACC calculation 10-yr risk of CV event of 9.5% with recommendation to consider Statin therapy of moderate-to-high potency (12/21/13)     Past Surgical History:  Procedure Laterality Date   CATARACT EXTRACTION W/ INTRAOCULAR LENS  IMPLANT, BILATERAL     COCCYGECTOMY  1985   VASECTOMY     Patient Active Problem List   Diagnosis Date Noted   Rotator cuff tear 12/08/2020   Capsulitis of shoulder, right 11/06/2020   Shoulder injury, right, initial encounter 11/02/2020   Abdominal wall hernia 03/20/2018   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 Tamera Punt  PRECAUTIONS: Shoulder, follow [place] protocol in media  SUBJECTIVE:  Pt reports that he is slightly sore today  from doing the new exercises in his HEP, although he reports overall continued progress in PT.   Pain: Are you having pain? Yes Numerical Pain Scale: 2/10 Pain Location: Rt shoulder Pain Frequency/Description: intermittent    OBJECTIVE:   *Unless otherwise noted, objective measures collected previously*  PATIENT SURVEYS:  FOTO 59% on 10/01/2021    UPPER EXTREMITY AROM/PROM:   A/PROM Right 09/08/2021 Right 09/21/2021 Right 09/24/2021 Right 09/28/2021 Right 10/05/2021  Shoulder flexion PROM: 85d, limited by dull pain at end  range  PROM: 115, limited by pt reported tightness at end range PROM: 130, limited by pt reported tightness at end range PROM: 130,  limited by pt reported tightness at end range AROM: 145  Shoulder extension         Shoulder abduction PROM: 75d, limited by dull pain at end range  PROM: 100d, limited by pt reported dull pain at end range PROM: 105d, limited by pt reported dull pain at end range PROM: 115d, limited by pt reported dull pain at end range AROM: 160 mild pain at end range  Shoulder adduction         Shoulder internal rotation PROM: 15d PROM: 45d  PROM: 50d  AROM: 60d AROM: 55d  Shoulder external rotation PROM: 40d PROM: 50d  PROM: 70d  AROM: 65d AROM: 53d  Elbow flexion AROM: 145       Elbow extension AROM: -15, limited by dull shoulder pain       (Blank rows = not tested)   UPPER EXTREMITY MMT:   MMT Right 09/08/2021 Left 09/08/2021 Right 10/05/2021  Shoulder flexion 2+/5 mild p!   4+/5 mild tightness  Shoulder extension       Shoulder abduction 2+/5   5/5 mild p!  Shoulder ER 2+/5   4/5 mildp!  Shoulder IR 2+/5   5/5  Shoulder adduction 2+/5     Middle trapezius 4/5     Lower trapezius       Elbow flexion 4/5, mild tenderness   5/5  Elbow extension 5/5     Grip strength (Lbs) 74 79   (Blank rows = not tested)                TODAY'S TREATMENT:   [facility] Adult PT Treatment:                                                DATE: 10/12/2021 Therapeutic Exercise: Standing PNF D2 shoulder flexion on Rt 3x10 Standing shoulder rolls 3x10 fwd/backward Rt shoulder YTB ER walkouts 3x10 Rt shoulder YTB IR walkouts 3x10 Standing shoulder scaption wand AAROM on Rt 3x10 with 5-sec hold at top Standing shoulder ER wand AAROM on Rt 3x10 with 5-sec hold at end-range Standing alternating low trap lift-off at wall 3x10 BIL Seated low rows to neutral shoulder extension with 15# cable 3x10 Manual Therapy: N/A Neuromuscular re-ed: N/A Therapeutic Activity: N/A Modalities: N/A Self  Care: N/A   [facility] Adult PT Treatment:                                                DATE: 10/08/2021 Therapeutic Exercise: Standing Rt shoulder flexion 50% isometric into wall 2x10 with 5-sec hold Standing Rt shoulder abduction 50%  isometric into wall 2x10 with 5-sec hold Standing Rt shoulder ER 50% isometric into wall 3x10 with 5-sec hold Standing Rt shoulder IR 50% isometric into wall 3x10 with 5-sec hold Row 3x10 RTB Supine shoulder flexion with pball AAROM 2x10 Seated shoulder ER AAROM with wand 3x10 with 5-sec holds at end range Supine Rt shoulder extension 50% isometric into mat table 2x10 with 5-sec hold Manual Therapy: PROM Rt shoulder flex/abd with gentle oscillation and distraction Neuromuscular re-ed: N/A Therapeutic Activity: N/A Modalities: N/A Self Care: N/A  [facility] Adult PT Treatment:                                                DATE: 10/05/2021 Therapeutic Exercise: Standing Rt shoulder flexion 50% isometric into wall 2x10 with 5-sec hold Standing Rt shoulder abduction 50% isometric into wall 2x10 with 5-sec hold Standing Rt shoulder ER 50% isometric into wall 2x10 with 5-sec hold Standing Rt shoulder IR 50% isometric into wall 2x10 with 5-sec hold Seated PNF D2 shoulder flexion 3x10 Seated forward/backward shoulder rolls 2x10 each Supine Rt shoulder extension 50% isometric into mat table 2x10 with 5-sec hold Manual Therapy: PROM Rt shoulder flex/abd with gentle oscillation and distraction Neuromuscular re-ed: N/A Therapeutic Activity: N/A Modalities: N/A Self Care: N/A    PATIENT EDUCATION: Education details: Updated HEP, educated on progression into next phase of post-op protocol Person educated: Patient Education method: Explanation Education comprehension: verbalized understanding      HOME EXERCISE PROGRAM: Access Code: [email or web address] URL: [email or web address] Date: 10/05/2021 Prepared by: Vanessa Dalton  Exercises Seated  Scapular Retraction - 2 x daily - 7 x weekly - 3 sets - 10 reps - 3 hold Seated elbow extension AROM stretch OVER KNEE - 2 x daily - 7 x weekly - 2 sets - 10 reps Seated Finger Composite Flexion with Putty - 2 x daily - 7 x weekly - 3 sets - 20 reps Seated Shoulder Pendulum Exercise - 2 x daily - 7 x weekly - 3 sets - 20 reps Supine Shoulder Flexion AAROM - 2 x daily - 7 x weekly - 3 sets - 10 reps - 5 sec hold  Added 10/05/2021: Isometric Shoulder Flexion at Wall - 1 x daily - 7 x weekly - 2 sets - 10 reps - 5-sec hold Standing Isometric Shoulder Internal Rotation at Doorway - 1 x daily - 7 x weekly - 2 sets - 10 reps - 5-sec hold Isometric Shoulder External Rotation at Wall - 1 x daily - 7 x weekly - 2 sets - 10 reps - 5-sec hold Isometric Shoulder Abduction at Wall - 1 x daily - 7 x weekly - 2 sets - 10 reps - 5-sec hold Shoulder PNF D2 Flexion - 1 x daily - 7 x weekly - 3 sets - 10 reps    ASSESSMENT:   CLINICAL IMPRESSION: Pt continues to respond well to in-clinic interventions, demonstrating excellent form and no pain with previously performed and new exercises. He reports good muscle activation following newly introduced shoulder ER and IR isometric walkouts with a band. He also demonstrates nearly normal overhead AROM with PNF D2 shoulder flexion today. He will continue to benefit from skilled PT to address his primary impairments and return to his prior level of function with less limitation.    GOALS: Goals reviewed with patient? Yes   SHORT TERM GOALS:  STG Name Target Date Goal status  1 Pt will report understanding and adherence to his HEP in order to promote independence in the management of his primary impairments. Baseline: Pt reports adherence to his HEP 10/06/2021 ACHIEVED  2 Pt will achieve >120 degrees shoulder elevation PROM in order to promote WNL AROM when reaching that point in his rehab protocol Baseline: progressed from 85 degrees to 115 degrees on  09/21/2021 Final: Approx 130 degrees on 09/24/21 10/06/2021 ACHIEVED    LONG TERM GOALS:    LTG Name Target Date Goal status  1 Pt will achieve >160 degrees shoulder elevation AROM  in order to get dressed without limitation. 10/05/2021: 145d 11/03/2021 ONGOING  2 Pt will achieve a FOTO score of 68% in order to demonstrate improved functional ability as it relates to his shoulder impairments.  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 Baseline: 11/03/2021 ONGOING  5 Pt will achieve >50 degrees of Rt shoulder ER and IR AROM in order to put on his seatbelt with less limitation. Baseline: Achieved 09/28/2021 11/03/2021 ACHIEVED    PLAN: PT FREQUENCY: 2x/week   PT DURATION: 8 weeks   PLANNED INTERVENTIONS: Therapeutic exercises, Therapeutic activity, Neuro Muscular re-education, Patient/Family education, Joint mobilization, Spinal mobilization, Cryotherapy, Taping, Vasopneumatic device, and Manual therapy   PLAN FOR NEXT SESSION: Progress shoulder PROM/ scapular and elbow strengthening in accordance to his post-op protocol; add shoulder AAROM to HEP   Vanessa Penton, PT, DPT 10/12/21 10:44 AM

## 2021-10-12 ENCOUNTER — Ambulatory Visit: Payer: Medicare HMO

## 2021-10-12 ENCOUNTER — Other Ambulatory Visit: Payer: Self-pay

## 2021-10-12 DIAGNOSIS — M25611 Stiffness of right shoulder, not elsewhere classified: Secondary | ICD-10-CM | POA: Diagnosis not present

## 2021-10-12 DIAGNOSIS — G8929 Other chronic pain: Secondary | ICD-10-CM

## 2021-10-12 DIAGNOSIS — M6281 Muscle weakness (generalized): Secondary | ICD-10-CM | POA: Diagnosis not present

## 2021-10-12 DIAGNOSIS — M25511 Pain in right shoulder: Secondary | ICD-10-CM | POA: Diagnosis not present

## 2021-10-12 IMAGING — MR MR SHOULDER*R* W/CM
6 series · 35 of 40 positions shown · IV contrast (agent unspecified)
Comparison: None.

CLINICAL DATA: Right shoulder pain.

EXAM:
MR ARTHROGRAM OF THE RIGHT SHOULDER
TECHNIQUE: Multiplanar, multisequence MR imaging of the right shoulder was
performed following the administration of intra-articular contrast.
CONTRAST:  See Injection Documentation.

[Series 3: T1 fat-sat · axial · 4.0mm · 0.47mm/px · z∈[-42,+69]mm · 8 of 23 slices shown (1 of 4)]
[im 1/23]
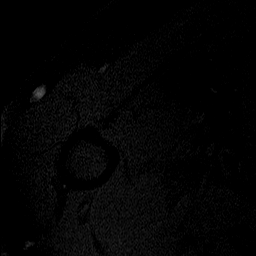
[im 4/23]
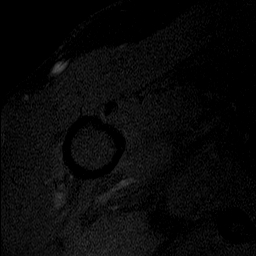
[im 7/23]
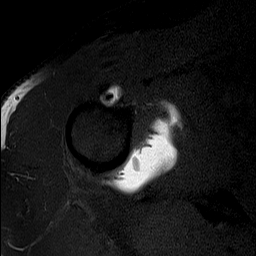
[im 10/23]
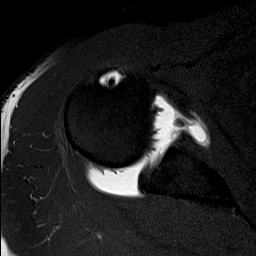
[im 13/23]
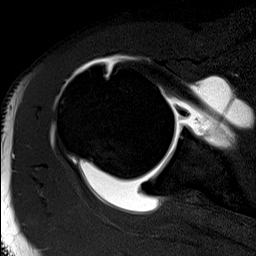
[im 16/23]
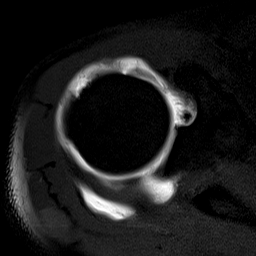
[im 19/23]
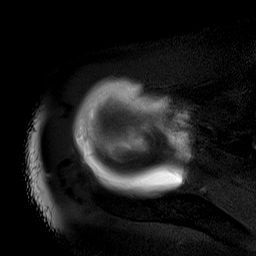
[im 23/23]
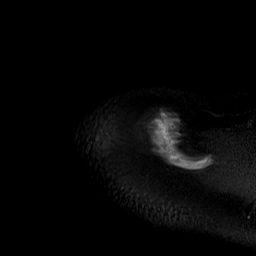

[Series 4: T2 fat-sat · oblique · 4.0mm · 0.55mm/px · 7 of 20 slices shown (1 of 2)]
[im 1/20]
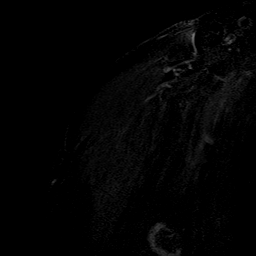
[im 4/20]
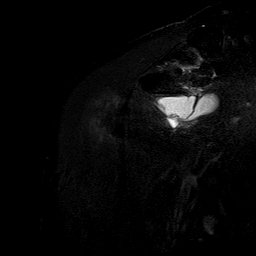
[im 7/20]
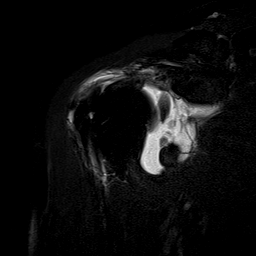
[im 10/20]
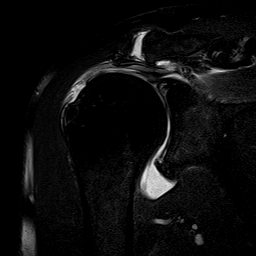
[im 13/20]
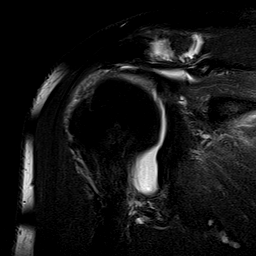
[im 16/20]
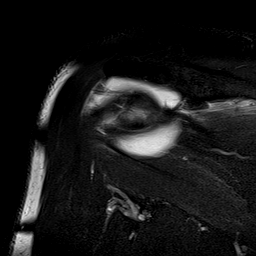
[im 20/20]
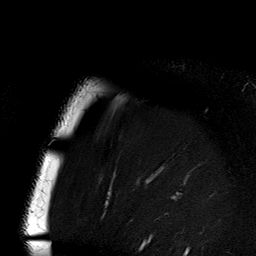

[Series 5: T1 fat-sat · oblique · 4.0mm · 0.27mm/px · 6 of 20 slices shown (2 of 4)]
[im 1/20]
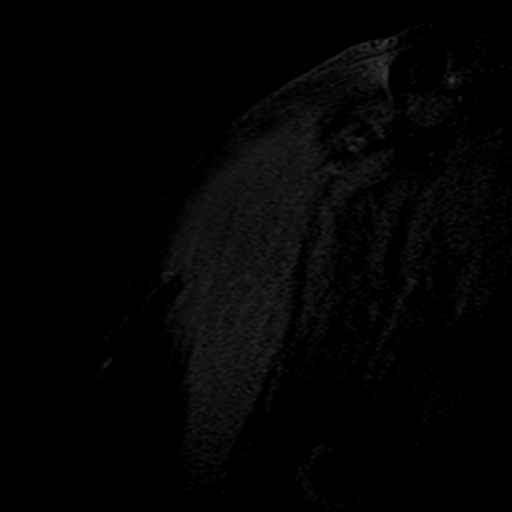
[im 4/20]
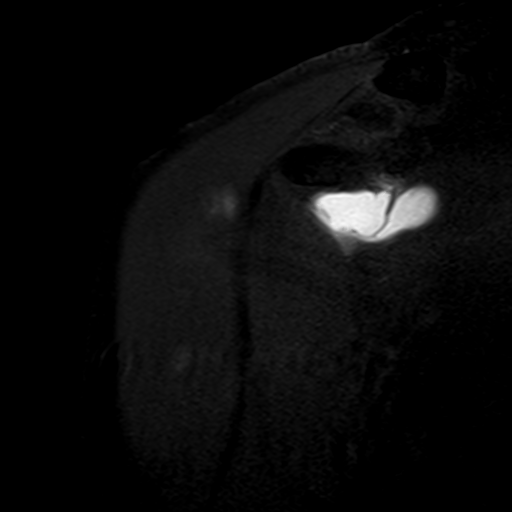
[im 8/20]
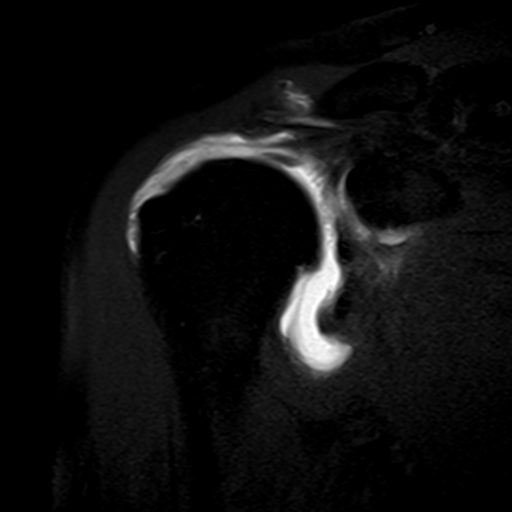
[im 12/20]
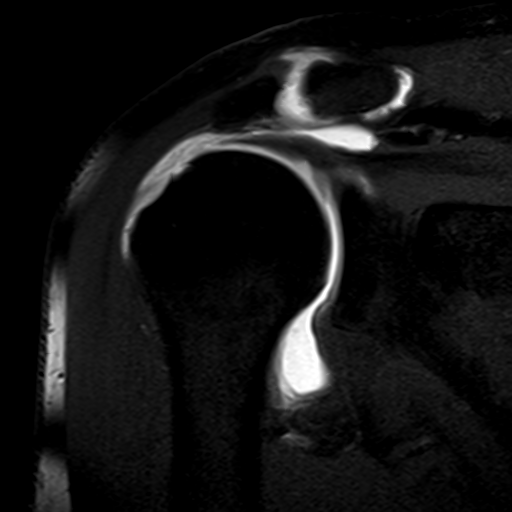
[im 16/20]
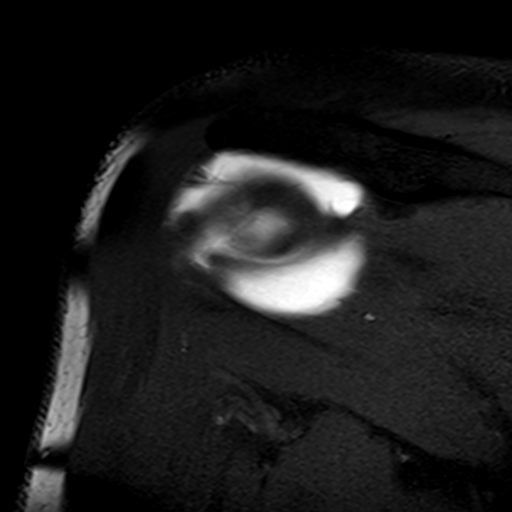
[im 20/20]
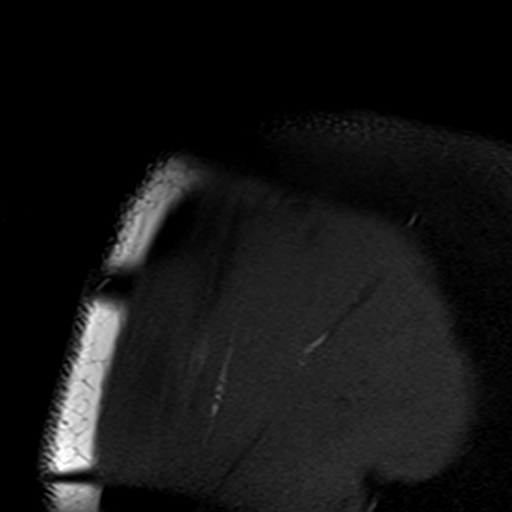

[Series 6: T1 fat-sat · oblique · non-contrast · 4.0mm · 0.27mm/px · 6 of 20 slices shown (3 of 4)]
[im 1/20]
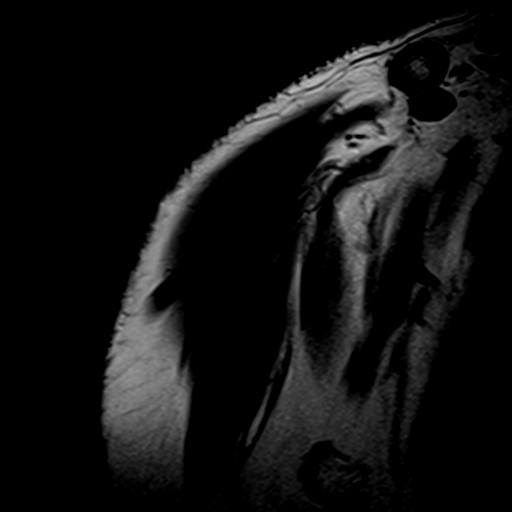
[im 4/20]
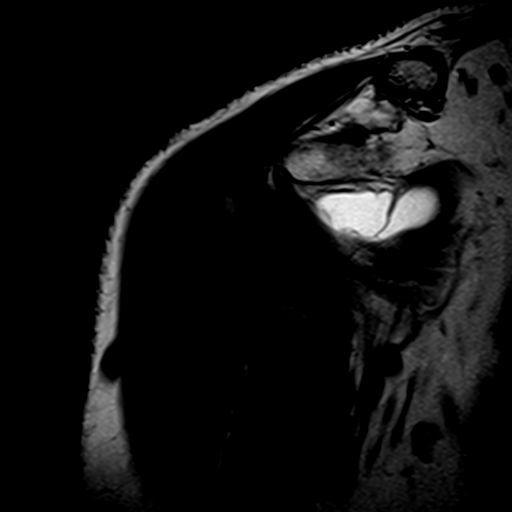
[im 8/20]
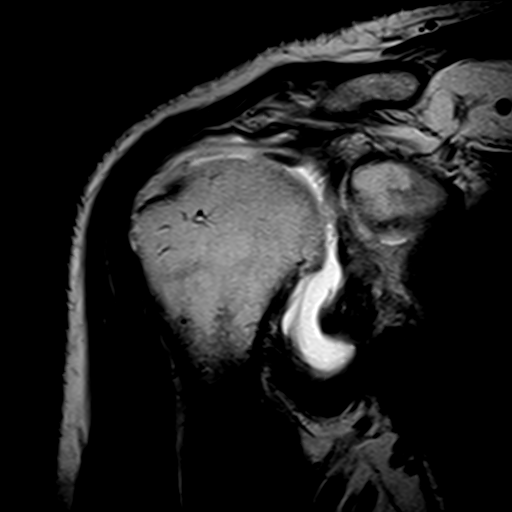
[im 12/20]
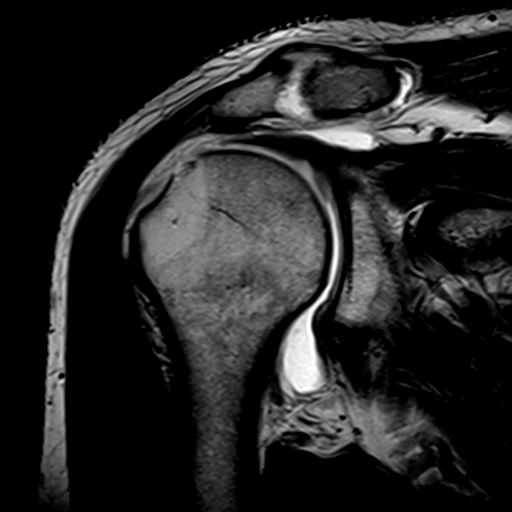
[im 16/20]
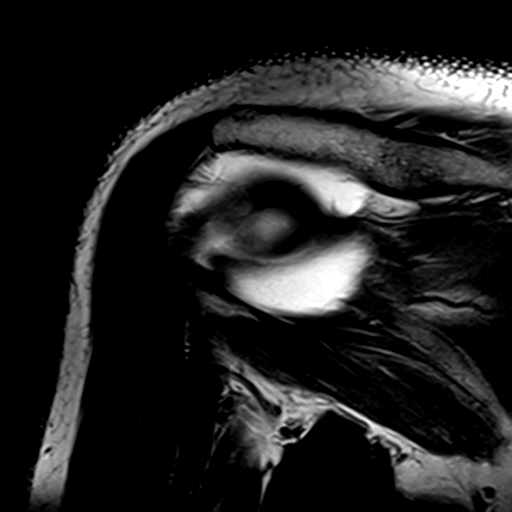
[im 20/20]
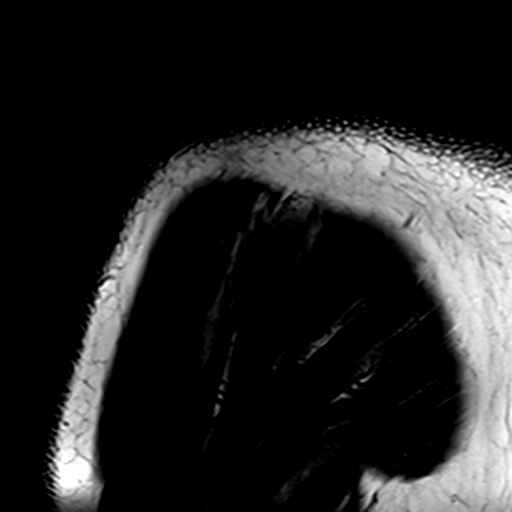

[Series 7: T2 fat-sat · oblique · 4.0mm · 0.55mm/px · 6 of 20 slices shown (2 of 2)]
[im 1/20]
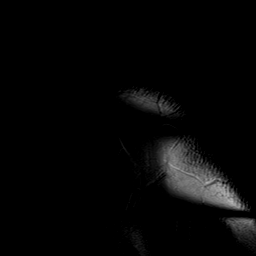
[im 4/20]
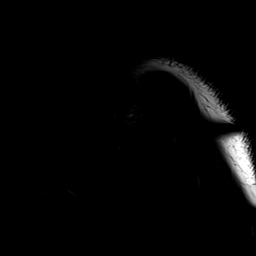
[im 8/20]
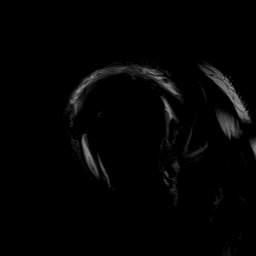
[im 12/20]
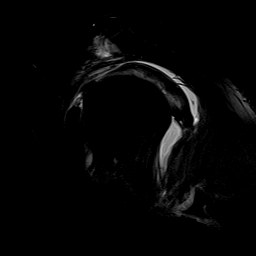
[im 16/20]
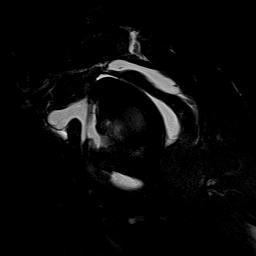
[im 20/20]
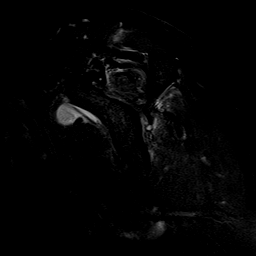

[Series 11: T1 fat-sat · sagittal · 4.0mm · 0.59mm/px · 2 of 21 slices shown (4 of 4)]
[im 1/21]
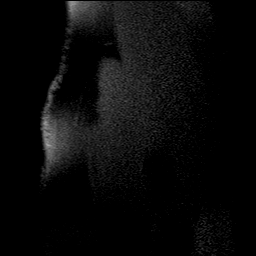
[im 4/21]
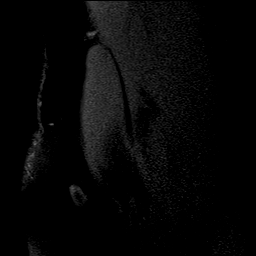

[35 of 40 positions shown; findings below may reference images not displayed]

FINDINGS: Rotator cuff: Severe tendinosis of the supraspinatus tendon with a
large high-grade partial-thickness, near complete, tear involving
the bursal and articular surfaces. Severe tendinosis of the
infraspinatus tendon which appears macerated although there is no
dicrete full thickness tear. Teres minor tendon is intact. Mild
tendinosis of the subscapularis tendon.

Muscles: No muscle atrophy or edema. No intramuscular fluid
collection or hematoma.

Biceps Long Head: Intraarticular and extraarticular portions of the
biceps tendon are intact.

Acromioclavicular Joint: Moderate arthropathy of the
acromioclavicular joint. Small amount of contrast in the
subacromial/subdeltoid bursa.

Glenohumeral Joint: Intraarticular contrast distending the joint
capsule. Mild partial-thickness cartilage loss of the glenohumeral
joint. Normal glenohumeral ligaments.

Labrum: Superior labral tear.

Bones: No fracture or dislocation. No aggressive osseous lesion.

Other: No fluid collection or hematoma.
IMPRESSION: 1. Severe tendinosis of the supraspinatus tendon with a large
high-grade partial-thickness, near complete, tear involving the
bursal and articular surfaces.
2. Severe tendinosis of the infraspinatus tendon which appears
macerated although there is no dicrete full thickness tear.
3. Mild tendinosis of the subscapularis tendon.

## 2021-10-22 ENCOUNTER — Other Ambulatory Visit: Payer: Self-pay

## 2021-10-22 ENCOUNTER — Ambulatory Visit: Payer: Medicare HMO

## 2021-10-22 DIAGNOSIS — G8929 Other chronic pain: Secondary | ICD-10-CM

## 2021-10-22 DIAGNOSIS — M25511 Pain in right shoulder: Secondary | ICD-10-CM | POA: Diagnosis not present

## 2021-10-22 DIAGNOSIS — M25611 Stiffness of right shoulder, not elsewhere classified: Secondary | ICD-10-CM | POA: Diagnosis not present

## 2021-10-22 DIAGNOSIS — M6281 Muscle weakness (generalized): Secondary | ICD-10-CM

## 2021-10-22 NOTE — Therapy (Addendum)
OUTPATIENT PHYSICAL THERAPY TREATMENT NOTE/PROGRESS NOTE  Progress Note Reporting Period 09/08/2021 to 10/22/2021  See note below for Objective Data and Assessment of Progress/Goals.     Patient Name: Christopher Wall, DDS MRN: [id number] DOB:[birth year], 76 y.o., male Today's Date: 10/29/2021  PCP: McDiarmid, Blane Ohara, MD REFERRING PROVIDER: McDiarmid, Blane Ohara, MD   PT End of Session - 10/22/21 1000     Visit Number 10    Number of Visits 17    Date for PT Re-Evaluation 12/03/21    Authorization Type [employer] MCR    Authorization Time Period FOTO v6, v10    Progress Note Due on Visit 20    PT Start Time 1000    PT Stop Time 1042    PT Time Calculation (min) 42 min    Activity Tolerance Patient tolerated treatment well    Behavior During Therapy [facility] for tasks assessed/performed                     Past Medical History:  Diagnosis Date   Abdominal wall hernia 03/20/2018   ATTENTION DEFICIT, W/O HYPERACTIVITY 10/30/2006   Qualifier: History of  By: McDiarmid MD, Todd     Cataract    bilateral cataracts with lens implant   Chest pain, atypical 12/05/2017   COLON POLYP 02/07/2014   Tubular Adenoma x 1 - Dr Ardis Hughs (GI)   Diverticulosis of colon 07/09/2018   Based on colonoscopy 06/2108   Encounter for neuropsychological testing 06/26/2015   [facility]: Only B/L avg in rote memory auditory info encoding. No mood dysfnc   Encounter for neuropsychological testing 2008   Vivien Rossetti (Neuropsy): Mixed anxiety and depression o/w normal.    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  Repeat testing 06/2015 at [facility]: Only finding was below average in rote memory auditory info encoding. No mood dysfunction.    Overweight (BMI 25.0-29.9) 10/30/2006   Qualifier: History of  By: McDiarmid MD, Todd     Prediabetes 12/21/2013   Pulmonary nodule, left 06/09/2015   Pure hypercholesterolemia 10/30/2006   ACC calculation 10-yr risk of CV event of 9.5% with recommendation to consider Statin therapy of moderate-to-high potency (12/21/13)     Past Surgical History:  Procedure Laterality Date   CATARACT EXTRACTION W/ INTRAOCULAR LENS  IMPLANT, [place]   VASECTOMY     Patient Active Problem List   Diagnosis Date Noted   Rotator cuff tear 12/08/2020   Capsulitis of shoulder, right 11/06/2020   Shoulder injury, right, initial encounter 11/02/2020   Abdominal wall hernia 03/20/2018   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 Tamera Punt  PRECAUTIONS: Shoulder, follow [facility] protocol in media  SUBJECTIVE:  Pt presents to PT with reports of slight R shoulder pain. Has been compliant with HEP, notes that he mainly has pain now when rolling onto it at night. Pt is ready to begin PT at this time.   Pain: Are you having pain? Yes Numerical Pain Scale: 1.5/10 Pain Location: Rt shoulder Pain Frequency/Description: intermittent     OBJECTIVE:   *Unless otherwise noted, objective measures collected previously*  PATIENT SURVEYS:  FOTO 63% on 10/22/2021    UPPER EXTREMITY AROM/PROM:   A/PROM Right 09/08/2021 Right 09/21/2021 Right 09/24/2021 Right 09/28/2021 Right 10/05/2021 Right 10/22/2021  Shoulder flexion PROM: 85d, limited by dull pain at end range  PROM: 115, limited by pt reported tightness at end range PROM: 130, limited by pt reported tightness at end range PROM: 130,  limited by pt reported tightness at end range AROM: 145 AROM: 148  Shoulder extension          Shoulder abduction PROM: 75d, limited by dull pain at end range  PROM: 100d, limited by pt reported dull pain at end range PROM: 105d, limited by pt reported dull pain at end range PROM: 115d, limited by pt reported dull pain at end range AROM: 160 mild pain at end range AROM: 150 mild pain at end range  Shoulder adduction          Shoulder internal rotation PROM: 15d PROM: 45d  PROM: 50d  AROM: 60d AROM: 55d   Shoulder external rotation PROM: 40d PROM: 50d  PROM: 70d  AROM: 65d AROM: 53d   Elbow flexion AROM: 145        Elbow extension AROM: -15, limited by dull shoulder pain        (Blank rows = not tested)   UPPER EXTREMITY MMT:   MMT Right 09/08/2021 Left 09/08/2021 Right 10/05/2021  Shoulder flexion 2+/5 mild p!   4+/5 mild tightness  Shoulder extension       Shoulder abduction 2+/5   5/5 mild p!  Shoulder ER 2+/5   4/5 mildp!  Shoulder IR 2+/5   5/5  Shoulder adduction 2+/5     Middle trapezius 4/5     Lower trapezius       Elbow flexion 4/5, mild tenderness   5/5  Elbow extension 5/5     Grip strength (Lbs) 74 79   (Blank rows = not tested)                TODAY'S TREATMENT:  [facility] Adult PT Treatment:                                                DATE: 10/22/2021 Therapeutic Exercise: UBE lvl 1.0 x 4 min (fwd/bwd) while taking subjective Row 3x12 13# Rt shoulder RTB ER walkouts 2x10 Rt shoulder RTB IR walkouts 2x10 Seated shoulder  ER wand AAROM on Rt 3x10 with 5-sec hold at end-range Supine cane flexion AAROM 2x10 - 5" hold S/L ER 2x10 2# - R shoulder S/L R shoulder abd 2x10  Seated low rows to neutral shoulder extension with 15# cable 2x10 Wall slide Rt shoulder flex x 10 - 5" hold Therapeutic Activity: Assessment of  tests/measures, goals, and outcomes for progress note  [facility] Adult PT Treatment:                                                DATE: 10/12/2021 Therapeutic Exercise: Standing PNF D2 shoulder flexion on Rt 3x10 Standing shoulder rolls 3x10 fwd/backward Rt shoulder YTB ER walkouts 3x10 Rt shoulder YTB IR walkouts 3x10 Standing shoulder scaption wand AAROM on Rt 3x10 with 5-sec hold at top Standing shoulder ER wand AAROM on Rt 3x10 with 5-sec hold at end-range Standing alternating low trap lift-off at wall 3x10 BIL Seated low rows to neutral shoulder extension with 15# cable 3x10 Manual Therapy: N/A Neuromuscular re-ed: N/A Therapeutic Activity: N/A Modalities: N/A Self Care: N/A   [facility] Adult PT Treatment:                                                DATE: 10/08/2021 Therapeutic Exercise: Standing Rt shoulder flexion 50% isometric into wall 2x10 with 5-sec hold Standing Rt shoulder abduction 50% isometric into wall 2x10 with 5-sec hold Standing Rt shoulder ER 50% isometric into wall 3x10 with 5-sec hold Standing Rt shoulder IR 50% isometric into wall 3x10 with 5-sec hold Row 3x10 RTB Supine shoulder flexion with pball AAROM 2x10 Seated shoulder ER AAROM with wand 3x10 with 5-sec holds at end range Supine Rt shoulder extension 50% isometric into mat table 2x10 with 5-sec hold Manual Therapy: PROM Rt shoulder flex/abd with gentle oscillation and distraction Neuromuscular re-ed: N/A Therapeutic Activity: N/A Modalities: N/A Self Care: N/A   PATIENT EDUCATION: Education details: Updated HEP, educated on progression into next phase of post-op protocol Person educated:  Patient Education method: Explanation Education comprehension: verbalized understanding      HOME EXERCISE PROGRAM: Access Code: [email or web address] URL: [email or web address] Date: 10/05/2021 Prepared by: Vanessa Abiquiu  Exercises Seated Scapular Retraction - 2 x daily - 7 x weekly - 3 sets - 10 reps - 3 hold Seated elbow extension AROM stretch OVER KNEE - 2 x daily - 7 x weekly - 2 sets - 10 reps Seated Finger Composite Flexion with Putty - 2 x daily - 7 x weekly - 3 sets - 20 reps Seated Shoulder Pendulum Exercise - 2 x daily - 7 x weekly - 3 sets - 20 reps Supine Shoulder Flexion AAROM - 2 x daily - 7 x weekly - 3 sets - 10 reps - 5 sec hold  Added 10/05/2021: Isometric Shoulder Flexion at Wall - 1 x daily - 7 x weekly - 2 sets - 10 reps - 5-sec hold Standing Isometric Shoulder Internal Rotation at Doorway - 1 x daily - 7 x weekly - 2 sets - 10 reps - 5-sec hold Isometric Shoulder External Rotation at Wall - 1 x daily - 7 x weekly - 2 sets - 10 reps - 5-sec hold Isometric Shoulder Abduction at Wall - 1 x daily - 7 x weekly - 2 sets - 10 reps - 5-sec hold Shoulder PNF D2 Flexion - 1 x daily - 7 x weekly - 3 sets - 10 reps    ASSESSMENT: Pt was able to complete all prescribed exercises with no adverse effect. Over the course of PT treatment, he has made improvements in  strength and ROM of R shoulder post surgery. He also shows continued subjective functional improvement, once again increasing his FOTO function score taken for today's progress note. Pt is progressing well with PT overall and will continue to be seen and progressed per protocol.   Problem List: decreased ROM, decreased strength, and pain   GOALS: Goals reviewed with patient? Yes   SHORT TERM GOALS:   STG Name Target Date Goal status  1 Pt will report understanding and adherence to his HEP in order to promote independence in the management of his primary impairments. Baseline: Pt reports adherence to his HEP  10/06/2021 ACHIEVED  2 Pt will achieve >120 degrees shoulder elevation PROM in order to promote WNL AROM when reaching that point in his rehab protocol Baseline: progressed from 85 degrees to 115 degrees on 09/21/2021 Final: Approx 130 degrees on 09/24/21 10/06/2021 ACHIEVED    LONG TERM GOALS:    LTG Name Target Date Goal status  1 Pt will achieve >160 degrees shoulder elevation AROM  in order to get dressed without limitation. 10/05/2021: 145d 10/22/2021: 148 deg 12/03/2021 ONGOING  2 Pt will achieve a FOTO score of 68% in order to demonstrate improved functional ability as it relates to his shoulder impairments.  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 Baseline: 12/03/2021 ONGOING  5 Pt will achieve >50 degrees of Rt shoulder ER and IR AROM in order to put on his seatbelt with less limitation. Baseline: Achieved 09/28/2021 12/03/2021 ACHIEVED    PLAN: PT FREQUENCY: 2x/week   PT DURATION: 8 weeks   PLANNED INTERVENTIONS: Therapeutic exercises, Therapeutic activity, Neuro Muscular re-education, Patient/Family education, Joint mobilization, Spinal mobilization, Cryotherapy, Taping, Vasopneumatic device, and Manual therapy   PLAN FOR NEXT SESSION: Progress shoulder PROM/ scapular and elbow strengthening in accordance to his post-op protocol; add shoulder AAROM to HEP  Ward Chatters, PT 10/29/21 12:10 PM

## 2021-10-29 ENCOUNTER — Ambulatory Visit: Payer: Medicare HMO

## 2021-10-29 ENCOUNTER — Other Ambulatory Visit: Payer: Self-pay

## 2021-10-29 DIAGNOSIS — M6281 Muscle weakness (generalized): Secondary | ICD-10-CM | POA: Diagnosis not present

## 2021-10-29 DIAGNOSIS — G8929 Other chronic pain: Secondary | ICD-10-CM

## 2021-10-29 DIAGNOSIS — M25611 Stiffness of right shoulder, not elsewhere classified: Secondary | ICD-10-CM | POA: Diagnosis not present

## 2021-10-29 DIAGNOSIS — M25511 Pain in right shoulder: Secondary | ICD-10-CM | POA: Diagnosis not present

## 2021-10-29 NOTE — Addendum Note (Signed)
Addended by: Ward Chatters on: 10/29/2021 12:10 PM   Modules accepted: Orders

## 2021-10-29 NOTE — Therapy (Signed)
OUTPATIENT PHYSICAL THERAPY TREATMENT NOTE    Patient Name: Christopher Wall, DDS MRN: [id number] DOB:1946-06-20, 76 y.o., male Today's Date: 10/29/2021  PCP: McDiarmid, Blane Ohara, MD REFERRING PROVIDER: McDiarmid, Blane Ohara, MD   PT End of Session - 10/29/21 0957     Visit Number 11    Number of Visits 17    Date for PT Re-Evaluation 12/03/21    Authorization Type [employer] MCR    Authorization Time Period FOTO v6, v10    Progress Note Due on Visit 20    PT Start Time 1000    PT Stop Time 1040    PT Time Calculation (min) 40 min    Activity Tolerance Patient tolerated treatment well    Behavior During Therapy [facility] for tasks assessed/performed                      Past Medical History:  Diagnosis Date   Abdominal wall hernia 03/20/2018   ATTENTION DEFICIT, W/O HYPERACTIVITY 10/30/2006   Qualifier: History of  By: McDiarmid MD, Todd     Cataract    bilateral cataracts with lens implant   Chest pain, atypical 12/05/2017   COLON POLYP 02/07/2014   Tubular Adenoma x 1 - Dr Ardis Hughs (GI)   Diverticulosis of colon 07/09/2018   Based on colonoscopy 06/2108   Encounter for neuropsychological testing 06/26/2015   [facility]: Only B/L avg in rote memory auditory info encoding. No mood dysfnc   Encounter for neuropsychological testing 2008   Vivien Rossetti (Neuropsy): Mixed anxiety and depression o/w normal.    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  Repeat testing 06/2015 at  [facility]: Only finding was below average in rote memory auditory info encoding. No mood dysfunction.    Overweight (BMI 25.0-29.9) 10/30/2006   Qualifier: History of  By: McDiarmid MD, Todd     Prediabetes 12/21/2013   Pulmonary nodule, left 06/09/2015   Pure hypercholesterolemia 10/30/2006   ACC calculation 10-yr risk of CV event of 9.5% with recommendation to consider Statin therapy of moderate-to-high potency (12/21/13)     Past Surgical History:  Procedure Laterality Date   CATARACT EXTRACTION W/ INTRAOCULAR LENS  IMPLANT, [place]     COCCYGECTOMY  1985   VASECTOMY     Patient Active Problem List   Diagnosis Date Noted   Rotator cuff tear 12/08/2020   Capsulitis of shoulder, right 11/06/2020   Shoulder injury, right, initial encounter 11/02/2020   Abdominal wall hernia 03/20/2018   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 Tamera Punt  PRECAUTIONS: Shoulder, follow [place] protocol in media  SUBJECTIVE:  Pt presents to PT with no reports of current R shoulder pain,  but does have some note some increased tightness after he had to reschedule his last appointment. Has been compliant with HEP with no adverse effect. Pt is ready to begin PT at this time.   Pain: Are you having pain? Yes Numerical Pain Scale: 0/10 Pain Location: Rt shoulder Pain Frequency/Description: intermittent    OBJECTIVE:   *Unless otherwise noted, objective measures collected previously*  PATIENT SURVEYS:  FOTO 63% on 10/22/2021    UPPER EXTREMITY AROM/PROM:   A/PROM Right 09/08/2021 Right 09/21/2021 Right 09/24/2021 Right 09/28/2021  Right 10/05/2021 Right 10/22/2021  Shoulder flexion PROM: 85d, limited by dull pain at end range  PROM: 115, limited by pt reported tightness at end range PROM: 130, limited by pt reported tightness at end range PROM: 130,  limited by pt reported tightness at end range AROM: 145 AROM: 148  Shoulder extension          Shoulder abduction PROM: 75d, limited by dull pain at end range  PROM: 100d, limited by pt reported dull pain at end range PROM: 105d, limited by pt reported dull pain at end range PROM: 115d, limited by pt reported dull pain at end range AROM: 160 mild pain at end range AROM: 150 mild pain at end range  Shoulder adduction          Shoulder internal rotation PROM: 15d PROM: 45d  PROM: 50d  AROM: 60d AROM: 55d   Shoulder external rotation PROM: 40d PROM: 50d  PROM: 70d  AROM: 65d AROM: 53d   Elbow flexion AROM: 145        Elbow extension AROM: -15, limited by dull shoulder pain        (Blank rows = not tested)   UPPER EXTREMITY MMT:   MMT Right 09/08/2021 Left 09/08/2021 Right 10/05/2021  Shoulder flexion 2+/5 mild p!   4+/5 mild tightness  Shoulder extension       Shoulder abduction 2+/5   5/5 mild p!  Shoulder ER 2+/5   4/5 mildp!  Shoulder IR 2+/5   5/5  Shoulder adduction 2+/5     Middle trapezius 4/5     Lower trapezius       Elbow flexion 4/5, mild tenderness   5/5  Elbow extension 5/5     Grip strength (Lbs) 74 79   (Blank rows = not tested)                TODAY'S TREATMENT:  [facility] Adult PT Treatment:                                                DATE: 10/29/2021 Therapeutic Exercise: UBE lvl 1.0 x 4 min (fwd/bwd) while taking subjective Row 3x12 17# Rt IR/ER 2x10 3# Seated shoulder ER wand AAROM on Rt 3x10 with 5-sec hold at end-range Supine pball flexion AAROM 2x10 - 5" hold S/L ER 2x10 2# - R shoulder Prone row 2x10 2# R Prone extension 2x10 2# R Seated scaption 2x10 R UE- open can Seated low rows to neutral shoulder extension with 15# cable 2x10 Wall slide  Rt shoulder flex/abd 2x10 - 5" hold Manual Therapy: PROM into R shoulder abd/ER   [facility] Adult PT Treatment:  DATE: 10/22/2021 Therapeutic Exercise: UBE lvl 1.0 x 4 min (fwd/bwd) while taking subjective Row 3x12 13# Rt shoulder RTB ER walkouts 2x10 Rt shoulder RTB IR walkouts 2x10 Seated shoulder ER wand AAROM on Rt 3x10 with 5-sec hold at end-range Supine cane flexion AAROM 2x10 - 5" hold S/L ER 2x10 2# - R shoulder S/L R shoulder abd 2x10  Seated low rows to neutral shoulder extension with 15# cable 2x10 Wall slide Rt shoulder flex x 10 - 5" hold Therapeutic Activity: Assessment of tests/measures, goals, and outcomes for progress note  [facility] Adult PT Treatment:                                                DATE: 10/12/2021 Therapeutic Exercise: Standing PNF D2 shoulder flexion on Rt 3x10 Standing shoulder rolls 3x10 fwd/backward Rt shoulder YTB ER walkouts 3x10 Rt shoulder YTB IR walkouts 3x10 Standing shoulder scaption wand AAROM on Rt 3x10 with 5-sec hold at top Standing shoulder ER wand AAROM on Rt 3x10 with 5-sec hold at end-range Standing alternating low trap lift-off at wall 3x10 BIL Seated low rows to neutral shoulder extension with 15# cable 3x10 Manual Therapy: N/A Neuromuscular re-ed: N/A Therapeutic Activity: N/A Modalities: N/A Self Care: N/A   PATIENT EDUCATION: Education details: Updated HEP, educated on progression into next phase of post-op protocol Person educated: Patient Education method: Explanation Education comprehension: verbalized understanding      HOME EXERCISE PROGRAM: Access Code: [email or web address] URL: [email or web address] Date: 10/05/2021 Prepared by: Vanessa Belt  Exercises Seated Scapular Retraction - 2 x daily - 7 x weekly - 3 sets - 10 reps - 3 hold Seated elbow extension AROM stretch OVER KNEE - 2 x daily - 7 x weekly - 2 sets - 10 reps Seated Finger Composite Flexion  with Putty - 2 x daily - 7 x weekly - 3 sets - 20 reps Seated Shoulder Pendulum Exercise - 2 x daily - 7 x weekly - 3 sets - 20 reps Supine Shoulder Flexion AAROM - 2 x daily - 7 x weekly - 3 sets - 10 reps - 5 sec hold  Added 10/05/2021: Isometric Shoulder Flexion at Wall - 1 x daily - 7 x weekly - 2 sets - 10 reps - 5-sec hold Standing Isometric Shoulder Internal Rotation at Doorway - 1 x daily - 7 x weekly - 2 sets - 10 reps - 5-sec hold Isometric Shoulder External Rotation at Wall - 1 x daily - 7 x weekly - 2 sets - 10 reps - 5-sec hold Isometric Shoulder Abduction at Wall - 1 x daily - 7 x weekly - 2 sets - 10 reps - 5-sec hold Shoulder PNF D2 Flexion - 1 x daily - 7 x weekly - 3 sets - 10 reps    ASSESSMENT: Pt was again able to complete all prescribed exercises with no adverse effect. Therapy today continued to progress strength and ROM of R shoulder per protocol. Pt is progressing very well and will continue to be seen and progressed as tolerated per POC.     GOALS: Goals reviewed with patient? Yes   SHORT TERM GOALS:   STG Name Target Date Goal status  1 Pt will report understanding and adherence to his HEP in order to promote independence in the management of his primary impairments. Baseline: Pt reports adherence to his HEP 10/06/2021 ACHIEVED  2 Pt will achieve >120 degrees shoulder elevation PROM in order to promote WNL AROM when reaching that point in his rehab protocol Baseline: progressed from 85 degrees to 115 degrees on 09/21/2021 Final: Approx 130 degrees on 09/24/21 10/06/2021 ACHIEVED    LONG TERM GOALS:    LTG Name Target Date Goal status  1 Pt will achieve >160 degrees shoulder elevation AROM  in order to get dressed without limitation. 10/05/2021: 145d 10/22/2021: 148 deg 12/03/2021 ONGOING  2 Pt will achieve a FOTO score of 68% in order to demonstrate improved functional ability as it relates to his shoulder impairments.  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 Baseline: 12/03/2021 ONGOING  5 Pt will achieve >50 degrees of Rt shoulder ER and IR AROM in order to put on his seatbelt with less limitation. Baseline: Achieved 09/28/2021 12/03/2021 ACHIEVED    PLAN: PT FREQUENCY: 2x/week   PT DURATION: 8 weeks   PLANNED INTERVENTIONS: Therapeutic exercises, Therapeutic activity, Neuro Muscular re-education, Patient/Family education, Joint mobilization, Spinal mobilization, Cryotherapy, Taping, Vasopneumatic device, and Manual therapy   PLAN FOR NEXT SESSION: Progress shoulder PROM/ scapular and elbow strengthening in accordance to his post-op protocol; add shoulder AAROM to HEP  Ward Chatters, PT 10/29/21 10:44 AM

## 2021-11-07 ENCOUNTER — Other Ambulatory Visit: Payer: Self-pay

## 2021-11-07 ENCOUNTER — Ambulatory Visit: Payer: Medicare HMO | Attending: Surgical

## 2021-11-07 DIAGNOSIS — G8929 Other chronic pain: Secondary | ICD-10-CM | POA: Insufficient documentation

## 2021-11-07 DIAGNOSIS — M6281 Muscle weakness (generalized): Secondary | ICD-10-CM | POA: Diagnosis not present

## 2021-11-07 DIAGNOSIS — M25611 Stiffness of right shoulder, not elsewhere classified: Secondary | ICD-10-CM | POA: Diagnosis not present

## 2021-11-07 DIAGNOSIS — M25511 Pain in right shoulder: Secondary | ICD-10-CM | POA: Insufficient documentation

## 2021-11-07 NOTE — Therapy (Signed)
OUTPATIENT PHYSICAL THERAPY TREATMENT NOTE    Patient Name: Christopher Wall, Christopher Wall MRN: [id number] DOB:[birth year], 76 y.o., male Today's Date: 11/07/2021  PCP: McDiarmid, Blane Ohara, MD REFERRING PROVIDER: Sheryle Hail, PA-C   PT End of Session - 11/07/21 1617     Visit Number 12    Number of Visits 17    Date for PT Re-Evaluation 12/03/21    Authorization Type Aet[employer] MCR    Authorization Time Period FOTO v6, v10    Progress Note Due on Visit 20    PT Start Time 1616    PT Stop Time 1654    PT Time Calculation (min) 38 min    Activity Tolerance Patient tolerated treatment well    Behavior During Therapy [facility] for tasks assessed/performed                       Past Medical History:  Diagnosis Date   Abdominal wall hernia 03/20/2018   ATTENTION DEFICIT, W/O HYPERACTIVITY 10/30/2006   Qualifier: History of  By: McDiarmid MD, Todd     Cataract    bilateral cataracts with lens implant   Chest pain, atypical 12/05/2017   COLON POLYP 02/07/2014   Tubular Adenoma x 1 - Dr Ardis Hughs (GI)   Diverticulosis of colon 07/09/2018   Based on colonoscopy 06/2108   Encounter for neuropsychological testing 06/26/2015   [facility]: Only B/L avg in rote memory auditory info encoding. No mood dysfnc   Encounter for neuropsychological testing 2008   Vivien Rossetti (Neuropsy): Mixed anxiety and depression o/w normal.    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  Repeat testing 06/2015 at  [facility]: Only finding was below average in rote memory auditory info encoding. No mood dysfunction.    Overweight (BMI 25.0-29.9) 10/30/2006   Qualifier: History of  By: McDiarmid MD, Todd     Prediabetes 12/21/2013   Pulmonary nodule, left 06/09/2015   Pure hypercholesterolemia 10/30/2006   ACC calculation 10-yr risk of CV event of 9.5% with recommendation to consider Statin therapy of moderate-to-high potency (12/21/13)     Past Surgical History:  Procedure Laterality Date   CATARACT EXTRACTION W/ INTRAOCULAR LENS  IMPLANT, [place]   VASECTOMY     Patient Active Problem List   Diagnosis Date Noted   Rotator cuff tear 12/08/2020   Capsulitis of shoulder, right 11/06/2020   Shoulder injury, right, initial encounter 11/02/2020   Abdominal wall hernia 03/20/2018   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 Tamera Punt  PRECAUTIONS: Shoulder, follow [place] protocol in media  SUBJECTIVE:  Pt presents to PT  with no current reports of shoulder pain. Has been compliant with HEP with no adverse effect. Pt is ready to begin PT at this time,   Pain: Are you having pain? Yes Numerical Pain Scale: 0/10 Pain Location: Rt shoulder Pain Frequency/Description: intermittent    OBJECTIVE:   *Unless otherwise noted, objective measures collected previously*  PATIENT SURVEYS:  FOTO 63% on 10/22/2021    UPPER EXTREMITY AROM/PROM:   A/PROM Right 09/08/2021 Right 09/21/2021 Right 09/24/2021 Right 09/28/2021 Right 10/05/2021 Right 10/22/2021 Right 11/07/2021   Shoulder flexion PROM: 85d, limited by dull pain at end range  PROM: 115, limited by pt reported tightness at end range PROM: 130, limited by pt reported tightness at end range PROM: 130,  limited by pt reported tightness at end range AROM: 145 AROM: 148 PROM: WNL  Shoulder extension           Shoulder abduction PROM: 75d, limited by dull pain at end range  PROM: 100d, limited by pt reported dull pain at end range PROM: 105d, limited by pt reported dull pain at end range PROM: 115d, limited by pt reported dull pain at end range AROM: 160 mild pain at end range AROM: 150 mild pain at end range PROM: 150  Shoulder adduction           Shoulder internal rotation PROM: 15d PROM: 45d  PROM: 50d  AROM: 60d AROM: 55d    Shoulder external rotation PROM: 40d PROM: 50d  PROM: 70d  AROM: 65d AROM: 53d    Elbow flexion AROM: 145         Elbow extension AROM: -15, limited by dull shoulder pain         (Blank rows = not tested)   UPPER EXTREMITY MMT:   MMT Right 09/08/2021 Left 09/08/2021 Right 10/05/2021  Shoulder flexion 2+/5 mild p!   4+/5 mild tightness  Shoulder extension       Shoulder abduction 2+/5   5/5 mild p!  Shoulder ER 2+/5   4/5 mildp!  Shoulder IR 2+/5   5/5  Shoulder adduction 2+/5     Middle trapezius 4/5     Lower trapezius       Elbow flexion 4/5, mild tenderness   5/5  Elbow extension 5/5     Grip strength (Lbs) 74 79   (Blank rows = not tested)                TODAY'S TREATMENT:  [facility] Adult PT Treatment:                                                DATE: 11/07/2021 Therapeutic Exercise: UBE lvl 1.0 x 4 min (fwd/bwd) while taking subjective Row 3x12 17# Shoulder extension 2x10 17# Rt IR/ER 2x10 3# Seated shoulder ER wand AAROM on Rt 3x10 with 5-sec hold at end-range Wall slide flex/abd R shoulder 2x10  Seated scaption 2x10 2# R Seated bilat ER 2x10 RTB Supine pball flexion AAROM x 15 - 5" hold S/L ER 2x15 2# - R shoulder S/L R shoulder abd 2x10 2# Seated low rows to  neutral shoulder extension with 15# cable 2x10 Manual Therapy: PROM into R shoulder abd/ER   [facility] Adult PT Treatment:  DATE: 10/29/2021 Therapeutic Exercise: UBE lvl 1.0 x 4 min (fwd/bwd) while taking subjective Row 3x12 17# Rt IR/ER 2x10 3# Seated shoulder ER wand AAROM on Rt 3x10 with 5-sec hold at end-range Supine pball flexion AAROM 2x10 - 5" hold S/L ER 2x10 2# - R shoulder Prone row 2x10 2# R Prone extension 2x10 2# R Seated scaption 2x10 R UE- open can Seated low rows to neutral shoulder extension with 15# cable 2x10 Wall slide Rt shoulder flex/abd 2x10 - 5" hold Manual Therapy: PROM into R shoulder abd/ER   [facility] Adult PT Treatment:                                                DATE: 10/22/2021 Therapeutic Exercise: UBE lvl 1.0 x 4 min (fwd/bwd) while taking subjective Row 3x12 13# Rt shoulder RTB ER walkouts 2x10 Rt shoulder RTB IR walkouts 2x10 Seated shoulder ER wand AAROM on Rt 3x10 with 5-sec hold at end-range Supine cane flexion AAROM 2x10 - 5" hold S/L ER 2x10 2# - R shoulder S/L R shoulder abd 2x10  Seated low rows to neutral shoulder extension with 15# cable 2x10 Wall slide Rt shoulder flex x 10 - 5" hold Therapeutic Activity: Assessment of tests/measures, goals, and outcomes for progress note   PATIENT EDUCATION: Education details: Updated HEP, educated on progression into next phase of post-op protocol Person educated: Patient Education method: Explanation Education comprehension: verbalized understanding      HOME EXERCISE PROGRAM: Access Code: [email or web address] URL: [email or web address] Date: 10/05/2021 Prepared by: Vanessa Hamburg  Exercises Seated Scapular Retraction - 2 x daily - 7 x weekly - 3 sets - 10 reps - 3 hold Seated elbow extension AROM stretch OVER KNEE - 2 x daily - 7 x weekly - 2 sets - 10 reps Seated Finger Composite Flexion with Putty - 2 x daily - 7 x weekly - 3 sets - 20  reps Seated Shoulder Pendulum Exercise - 2 x daily - 7 x weekly - 3 sets - 20 reps Supine Shoulder Flexion AAROM - 2 x daily - 7 x weekly - 3 sets - 10 reps - 5 sec hold  Added 10/05/2021: Isometric Shoulder Flexion at Wall - 1 x daily - 7 x weekly - 2 sets - 10 reps - 5-sec hold Standing Isometric Shoulder Internal Rotation at Doorway - 1 x daily - 7 x weekly - 2 sets - 10 reps - 5-sec hold Isometric Shoulder External Rotation at Wall - 1 x daily - 7 x weekly - 2 sets - 10 reps - 5-sec hold Isometric Shoulder Abduction at Wall - 1 x daily - 7 x weekly - 2 sets - 10 reps - 5-sec hold Shoulder PNF D2 Flexion - 1 x daily - 7 x weekly - 3 sets - 10 reps    ASSESSMENT: Pt was again able to complete all prescribed exercises with no adverse effect or increase in pain. He continues to progress very well post RTC repair sugery, with continued improve in R shoulder strength and both active/passive ROM. PT will continue to progress per protocol, may be ready for discharge soon with continued improvement.     GOALS: Goals reviewed with patient? Yes   SHORT TERM GOALS:   STG Name Target Date Goal status  1 Pt will report understanding and adherence to his HEP in order to promote independence  in the management of his primary impairments. Baseline: Pt reports adherence to his HEP 10/06/2021 ACHIEVED  2 Pt will achieve >120 degrees shoulder elevation PROM in order to promote WNL AROM when reaching that point in his rehab protocol Baseline: progressed from 85 degrees to 115 degrees on 09/21/2021 Final: Approx 130 degrees on 09/24/21 10/06/2021 ACHIEVED    LONG TERM GOALS:    LTG Name Target Date Goal status  1 Pt will achieve >160 degrees shoulder elevation AROM  in order to get dressed without limitation. 10/05/2021: 145d 10/22/2021: 148 deg 12/03/2021 ONGOING  2 Pt will achieve a FOTO score of 68% in order to demonstrate improved functional ability as it relates to his shoulder impairments.  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 Baseline: 12/03/2021 ONGOING  5 Pt will achieve >50 degrees of Rt shoulder ER and IR AROM in order to put on his seatbelt with less limitation. Baseline: Achieved 09/28/2021 12/03/2021 ACHIEVED    PLAN: PT FREQUENCY: 2x/week   PT DURATION: 8 weeks   PLANNED INTERVENTIONS: Therapeutic exercises, Therapeutic activity, Neuro Muscular re-education, Patient/Family education, Joint mobilization, Spinal mobilization, Cryotherapy, Taping, Vasopneumatic device, and Manual therapy   PLAN FOR NEXT SESSION: Progress shoulder PROM/ scapular and elbow strengthening in accordance to his post-op protocol; add shoulder AAROM to HEP   Ward Chatters, PT 11/07/21 4:56 PM

## 2021-11-12 ENCOUNTER — Other Ambulatory Visit: Payer: Self-pay

## 2021-11-12 ENCOUNTER — Ambulatory Visit: Payer: Medicare HMO

## 2021-11-12 DIAGNOSIS — M6281 Muscle weakness (generalized): Secondary | ICD-10-CM

## 2021-11-12 DIAGNOSIS — M25511 Pain in right shoulder: Secondary | ICD-10-CM | POA: Diagnosis not present

## 2021-11-12 DIAGNOSIS — G8929 Other chronic pain: Secondary | ICD-10-CM | POA: Diagnosis not present

## 2021-11-12 DIAGNOSIS — M25611 Stiffness of right shoulder, not elsewhere classified: Secondary | ICD-10-CM

## 2021-11-12 NOTE — Therapy (Signed)
OUTPATIENT PHYSICAL THERAPY TREATMENT NOTE    Patient Name: Christopher Wall, DDS MRN: [id number] DOB:1945/11/02, 76 y.o., male Today's Date: 11/12/2021  PCP: McDiarmid, Blane Ohara, MD REFERRING PROVIDER: McDiarmid, Blane Ohara, MD   PT End of Session - 11/12/21 0916     Visit Number 13    Number of Visits 17    Date for PT Re-Evaluation 12/03/21    Authorization Type [employer]et[employer] MCR    Authorization Time Period FOTO v6, v10    Progress Note Due on Visit 20    PT Start Time 0916    PT Stop Time 0954    PT Time Calculation (min) 38 min    Activity Tolerance Patient tolerated treatment well    Behavior During Therapy [facility] for tasks assessed/performed                        Past Medical History:  Diagnosis Date   Abdominal wall hernia 03/20/2018   ATTENTION DEFICIT, 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 No mood dysfnc   Encounter for neuropsychological testing 2008   Vivien Rossetti (Neuropsy): Mixed anxiety and depression o/w normal.    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  Repeat testing 06/2015 at  [facility]: Only finding was below average in rote memory auditory info encoding. No mood dysfunction.    Overweight (BMI 25.0-29.9) 10/30/2006   Qualifier: History of  By: McDiarmid MD, Todd     Prediabetes 12/21/2013   Pulmonary nodule, left 06/09/2015   Pure hypercholesterolemia 10/30/2006   ACC calculation 10-yr risk of CV event of 9.5% with recommendation to consider Statin therapy of moderate-to-high potency (12/21/13)     Past Surgical History:  Procedure Laterality Date   CATARACT EXTRACTION W/ INTRAOCULAR LENS  IMPLANT, BILATERAL     COCCYGECTOMY  1985   VASECTOMY     Patient Active Problem List   Diagnosis Date Noted   Rotator cuff tear 12/08/2020   Capsulitis of shoulder, right 11/06/2020   Shoulder injury, right, initial encounter 11/02/2020   Abdominal wall hernia 03/20/2018   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 Tamera Punt  PRECAUTIONS: Shoulder, follow [place] protocol in media  SUBJECTIVE:  Pt presents  to PT with no current reports of pain or discomfort. Has been compliant with HEP with no adverse effect. Pt is ready to begin PT treatment at this time.   Pain: Are you having pain? Yes Numerical Pain Scale: 0/10 Pain Location: Rt shoulder Pain Frequency/Description: intermittent    OBJECTIVE:   *Unless otherwise noted, objective measures collected previously*  PATIENT SURVEYS:  FOTO 63% on 10/22/2021    UPPER EXTREMITY AROM/PROM:   A/PROM Right 09/08/2021 Right 09/21/2021 Right 09/24/2021 Right 09/28/2021 Right 10/05/2021 Right 10/22/2021  Right 11/07/2021 Right 11/12/2021  Shoulder flexion PROM: 85d, limited by dull pain at end range  PROM: 115, limited by pt reported tightness at end range PROM: 130, limited by pt reported tightness at end range PROM: 130,  limited by pt reported tightness at end range AROM: 145 AROM: 148 PROM: WNL 148 AROM  Shoulder extension            Shoulder abduction PROM: 75d, limited by dull pain at end range  PROM: 100d, limited by pt reported dull pain at end range PROM: 105d, limited by pt reported dull pain at end range PROM: 115d, limited by pt reported dull pain at end range AROM: 160 mild pain at end range AROM: 150 mild pain at end range PROM: 150 150 AROM  Shoulder adduction            Shoulder internal rotation PROM: 15d PROM: 45d  PROM: 50d  AROM: 60d AROM: 55d   T12  Shoulder external rotation PROM: 40d PROM: 50d  PROM: 70d  AROM: 65d AROM: 53d   C5  Elbow flexion AROM: 145          Elbow extension AROM: -15, limited by dull shoulder pain          (Blank rows = not tested)   UPPER EXTREMITY MMT:   MMT Right 09/08/2021 Left 09/08/2021 Right 10/05/2021  Shoulder flexion 2+/5 mild p!   4+/5 mild tightness  Shoulder extension       Shoulder abduction 2+/5   5/5 mild p!  Shoulder ER 2+/5   4/5 mildp!  Shoulder IR 2+/5   5/5  Shoulder adduction 2+/5     Middle trapezius 4/5     Lower trapezius       Elbow flexion 4/5, mild tenderness   5/5  Elbow extension 5/5     Grip strength (Lbs) 74 79   (Blank rows = not tested)                TODAY'S TREATMENT:  [facility] Adult PT Treatment: DATE: 11/12/2021 Therapeutic Exercise: UBE lvl 1.0 x 4 min (fwd/bwd) while taking subjective Row 3x15 17# Shoulder extension 3x10 17# Rt shoulder ER 2x10 3# R shoulder IR 2x10 7# Seated shoulder ER wand AAROM on Rt 3x10 with 5-sec hold at end-range Wall slide flex/abd R shoulder 2x10  Prone row 3x10 3# R Prone ext 3x10 5# R Seated scaption 2x10 3# R Seated bilat ER 3x10 RTB S/L ER 2x10 3# - R shoulder S/L R  shoulder abd 2x10 3# Shelf taps 2x10 3# R shoulder flexion Seated low rows to neutral shoulder extension with 15# cable 2x10 Manual Therapy: PROM into R shoulder abd/ER   [facility] Adult PT Treatment: DATE: 11/07/2021 Therapeutic Exercise: UBE lvl 1.0 x 4 min (fwd/bwd) while taking subjective Row 3x12 17# Shoulder extension 2x10 17# Rt IR/ER 2x10 3# Seated shoulder ER wand AAROM on Rt 3x10 with 5-sec hold at end-range Wall slide flex/abd R  shoulder 2x10  Seated scaption 2x10 2# R Seated bilat ER 2x10 RTB Supine pball flexion AAROM x 15 - 5" hold S/L ER 2x15 2# - R shoulder S/L R shoulder abd 2x10 2# Seated low rows to neutral shoulder extension with 20# cable 2x10  [facility] Adult PT Treatment: DATE: 10/29/2021 Therapeutic Exercise: UBE lvl 1.0 x 4 min (fwd/bwd) while taking subjective Row 3x12 17# Rt IR/ER 2x10 3# Seated shoulder ER wand AAROM on Rt 3x10 with 5-sec hold at end-range Supine pball flexion AAROM 2x10 - 5" hold S/L ER 2x10 2# - R shoulder Prone row 2x10 2# R Prone extension 2x10 2# R Seated scaption 2x10 R UE- open can Seated low rows to neutral shoulder extension with 15# cable 2x10 Wall slide Rt shoulder flex/abd 2x10 - 5" hold Manual Therapy: PROM into R shoulder abd/ER    PATIENT EDUCATION: Education details: Updated HEP, educated on progression into next phase of post-op protocol Person educated: Patient Education method: Explanation Education comprehension: verbalized understanding      HOME EXERCISE PROGRAM: Access Code: [email or web address] URL: [email or web address] Date: 10/05/2021 Prepared by: Vanessa Freeport  Exercises Seated Scapular Retraction - 2 x daily - 7 x weekly - 3 sets - 10 reps - 3 hold Seated elbow extension AROM stretch OVER KNEE - 2 x daily - 7 x weekly - 2 sets - 10 reps Seated Finger Composite Flexion with Putty - 2 x daily - 7 x weekly - 3 sets - 20 reps Seated Shoulder Pendulum Exercise - 2 x daily - 7 x weekly - 3 sets - 20  reps Supine Shoulder Flexion AAROM - 2 x daily - 7 x weekly - 3 sets - 10 reps - 5 sec hold  Added 10/05/2021: Isometric Shoulder Flexion at Wall - 1 x daily - 7 x weekly - 2 sets - 10 reps - 5-sec hold Standing Isometric Shoulder Internal Rotation at Doorway - 1 x daily - 7 x weekly - 2 sets - 10 reps - 5-sec hold Isometric Shoulder External Rotation at Wall - 1 x daily - 7 x weekly - 2 sets - 10 reps - 5-sec hold Isometric Shoulder Abduction at Wall - 1 x daily - 7 x weekly - 2 sets - 10 reps - 5-sec hold Shoulder PNF D2 Flexion - 1 x daily - 7 x weekly - 3 sets - 10 reps    ASSESSMENT: Pt was again able to complete all prescribed exercises with no adverse effect or increase in pain. Therapy today focused on improving R shoulder strength and ROM post surgery per protocol. Pt continues to progress very well post RTC repair and will continue to be seen and progressed as tolerated per POC.     GOALS: Goals reviewed with patient? Yes   SHORT TERM GOALS:   STG Name Target Date Goal status  1 Pt will report understanding and adherence to his HEP in order to promote independence in the management of his primary impairments. Baseline: Pt reports adherence to his HEP 10/06/2021 ACHIEVED  2 Pt will achieve >120 degrees shoulder elevation PROM in order to promote WNL AROM when reaching that point in his rehab protocol Baseline: progressed from 85 degrees to 115 degrees on 09/21/2021 Final: Approx 130 degrees on 09/24/21 10/06/2021 ACHIEVED    LONG TERM GOALS:    LTG Name Target Date Goal status  1 Pt will achieve >160 degrees shoulder elevation AROM  in order to get dressed without limitation. 10/05/2021: 145d 10/22/2021: 148 deg 12/03/2021 ONGOING  2 Pt will achieve a FOTO score of 68% in order to demonstrate improved functional ability as it relates to his shoulder impairments.  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 Baseline: 12/03/2021 ONGOING  5 Pt will achieve >50 degrees of Rt shoulder ER and IR AROM in order to put on his seatbelt with less limitation. Baseline: Achieved 09/28/2021 12/03/2021 ACHIEVED    PLAN: PT FREQUENCY: 2x/week   PT DURATION: 8 weeks   PLANNED INTERVENTIONS: Therapeutic exercises, Therapeutic activity, Neuro Muscular re-education, Patient/Family education, Joint mobilization, Spinal mobilization, Cryotherapy, Taping, Vasopneumatic device, and Manual therapy   PLAN FOR NEXT SESSION: Progress shoulder PROM/ scapular and elbow strengthening in accordance to his post-op protocol; add shoulder AAROM to HEP   Ward Chatters, PT 11/12/21 9:56 AM

## 2021-11-19 ENCOUNTER — Other Ambulatory Visit: Payer: Self-pay

## 2021-11-19 ENCOUNTER — Ambulatory Visit: Payer: Medicare HMO

## 2021-11-19 DIAGNOSIS — G8929 Other chronic pain: Secondary | ICD-10-CM

## 2021-11-19 DIAGNOSIS — M25611 Stiffness of right shoulder, not elsewhere classified: Secondary | ICD-10-CM | POA: Diagnosis not present

## 2021-11-19 DIAGNOSIS — M6281 Muscle weakness (generalized): Secondary | ICD-10-CM

## 2021-11-19 DIAGNOSIS — M25511 Pain in right shoulder: Secondary | ICD-10-CM | POA: Diagnosis not present

## 2021-11-19 NOTE — Therapy (Signed)
?OUTPATIENT PHYSICAL THERAPY TREATMENT NOTE ?  ? ?Patient Name: Christopher Wall, DDS ?MRN: [id number] ?DOB:02/07/1946, 76 y.o., male ?Today's Date: 11/19/2021 ? ?PCP: McDiarmid, Blane Ohara, MD ?REFERRING PROVIDER: McDiarmid, Blane Ohara, MD ? ? PT End of Session - 11/19/21 0737   ? ? Visit Number 14   ? Number of Visits 17   ? Date for PT Re-Evaluation 12/03/21   ? Authorization Type Aetna MCR   ? Authorization Time Period FOTO v6, v10   ? Progress Note Due on Visit 20   ? PT Start Time 0745   ? PT Stop Time [phone]   ? PT Time Calculation (min) 41 min   ? Activity Tolerance Patient tolerated treatment well   ? Behavior During Therapy [facility] for tasks assessed/performed   ? ?  ?  ? ?  ? ? ? ? ? ? ? ? ? ? ? ? ? ?Past Medical History:  ?Diagnosis Date  ? Abdominal wall hernia 03/20/2018  ? 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 Qualifier: History of  By: McDiarmid MD, Sherren Mocha    ? Cataract   ? bilateral cataracts with lens implant  ? Chest pain, atypical 12/05/2017  ? COLON POLYP 02/07/2014  ? Tubular Adenoma x 1 - Dr Ardis Hughs (GI)  ? Diverticulosis of colon 07/09/2018  ? Based on colonoscopy 06/2108  ? Encounter for neuropsychological testing 06/26/2015  ? [facility]: Only B/L avg in rote memory auditory info encoding. No mood dysfnc  ? Encounter for neuropsychological testing 2008  ? Vivien Rossetti (Neuropsy): Mixed anxiety and depression o/w normal.   ? Family history of esophageal cancer 12/26/2017  ? GASTROESOPHAGEAL REFLUX, NO ESOPHAGITIS 10/30/2006  ? Qualifier: History of  By: McDiarmid MD, Sherren Mocha    ? 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 HYPERLIPIDEMIA 10/30/2006  ? Qualifier: Diagnosis of  By: Drucie Ip    ? Hypertension 04/24/2009  ? Qualifier: Diagnosis of  By: McDiarmid MD, Sherren Mocha    ? Left flank pain 06/15/2018  ? Memory difficulties 06/09/2015  ? Prior neuropsychology testing in 2008 by Dr Valentina Shaggy (Neuropsychologist) was unremarkable other than mixed anxiety/depression.  Repeat testing 06/2015 at  [facility]: Only finding was below average in rote memory auditory info encoding. No mood dysfunction.   ? Overweight (BMI 25.0-29.9) 10/30/2006  ? Qualifier: History of  By: McDiarmid MD, Sherren Mocha    ? Prediabetes 12/21/2013  ? Pulmonary nodule, left 06/09/2015  ? Pure hypercholesterolemia 10/30/2006  ? ACC calculation 10-yr risk of CV event of 9.5% with recommendation to consider Statin therapy of moderate-to-high potency (12/21/13)    ? ?Past Surgical History:  ?Procedure Laterality Date  ? CATARACT EXTRACTION W/ INTRAOCULAR LENS  IMPLANT, BILATERAL    ? COCCYGECTOMY  1985  ? VASECTOMY    ? ?Patient Active Problem List  ? Diagnosis Date Noted  ? Rotator cuff tear 12/08/2020  ? Capsulitis of shoulder, right 11/06/2020  ? Shoulder injury, right, initial encounter 11/02/2020  ? Abdominal wall hernia 03/20/2018  ? 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 Prediabetes 12/21/2013  ? Pure hypercholesterolemia 10/30/2006  ? Obesity (BMI 30.0-34.9) 10/30/2006  ? GASTROESOPHAGEAL REFLUX, NO ESOPHAGITIS 10/30/2006  ? ? ?REFERRING DIAG: right shoulder RCR, SAD 08/20/21 ? ?THERAPY DIAG:  ?Chronic right shoulder pain ? ?Muscle weakness (generalized) ? ?Stiffness of right shoulder, not elsewhere classified ? ?PERTINENT HISTORY: Rt RCR, SAD on 08/20/2021 with Dr. Tamera Punt ? ?PRECAUTIONS: Shoulder, follow [facility] protocol in media ? ?SUBJECTIVE:  ?Pt presents  to PT with reports of previous week of increased soreness, but has no pain at the moment. Pt has been compliant with HEP with no adverse effect. Pt is ready to begin PT at this time. ? ?Pain: ?Are you having pain? Yes ?Numerical Pain Scale: 0/10 ?Pain Location: Rt shoulder ?Pain Frequency/Description: intermittent  ? ? ?OBJECTIVE:  ? *Unless otherwise noted, objective measures collected previously* ? ?PATIENT SURVEYS:  ?FOTO 63% on 10/22/2021 ?   ?UPPER EXTREMITY AROM/PROM: ?  ?A/PROM Right ?09/08/2021 Right ?09/21/2021 Right ?09/24/2021 Right ?09/28/2021  Right ?10/05/2021 Right ?10/22/2021 Right ?11/07/2021 Right ?11/12/2021  ?Shoulder flexion PROM: 85d, limited by dull pain at end range  PROM: 115, limited by pt reported tightness at end range PROM: 130, limited by pt reported tightness at end range PROM: 130,  ?limited by pt reported tightness at end range AROM: 145 AROM: 148 PROM: WNL 148 AROM  ?Shoulder extension            ?Shoulder abduction PROM: 75d, limited by dull pain at end range  PROM: 100d, limited by pt reported dull pain at end range PROM: 105d, limited by pt reported dull pain at end range PROM: 115d, limited by pt reported dull pain at end range AROM: 160 mild pain at end range AROM: 150 mild pain at end range PROM: 150 150 AROM  ?Shoulder adduction            ?Shoulder internal rotation PROM: 15d PROM: 45d  PROM: 50d  AROM: 60d AROM: 55d   T12  ?Shoulder external rotation PROM: 40d PROM: 50d  PROM: 70d  AROM: 65d AROM: 53d   C5  ?Elbow flexion AROM: 145          ?Elbow extension AROM: -15, limited by dull shoulder pain          ?(Blank rows = not tested) ?  ?UPPER EXTREMITY MMT: ?  ?MMT Right ?09/08/2021 Left ?09/08/2021 Right ?10/05/2021  ?Shoulder flexion 2+/5 mild p!   4+/5 mild tightness  ?Shoulder extension       ?Shoulder abduction 2+/5   5/5 mild p!  ?Shoulder ER 2+/5   4/5 mildp!  ?Shoulder IR 2+/5   5/5  ?Shoulder adduction 2+/5     ?Middle trapezius 4/5     ?Lower trapezius       ?Elbow flexion 4/5, mild tenderness   5/5  ?Elbow extension 5/5     ?Grip strength (Lbs) 74 79   ?(Blank rows = not tested) ?  ?  ?           ?TODAY'S TREATMENT:  ?[facility] Adult PT Treatment: DATE: 11/19/2021 ?Therapeutic Exercise: ?UBE lvl 2.0 x 4 min (fwd/bwd) while taking subjective ?Row 3x15 17# ?Shoulder extension 3x10 17# ?Rt shoulder ER 3x10 3# ?R shoulder IR 3x10 7# ?Supine D2 flex R 2x15 ?Seated shoulder ER wand AAROM on Rt 3x10 with 5-sec hold at end-range ?Pulley R shoulder IR x 60" ?Wall slide flex/abd R shoulder 2x10 each - 5" hold ?Seated scaption x 10 3# R ?S/L  ER 2x10 3# - R shoulder ?S/L R shoulder abd 2x10 3# ?Shelf taps 3x10 3# R shoulder flexion ?Seated low rows to neutral shoulder extension with 20# cable 2x10 ?Manual Therapy: ?PROM into R shoulder abd/ER  ? ?[facility] Adult PT Treatment: DATE: 11/12/2021 ?Therapeutic Exercise: ?UBE lvl 1.0 x 4 min (fwd/bwd) while taking subjective ?Row 3x15 17# ?Shoulder extension 3x10 17# ?Rt shoulder ER 2x10 3# ?R shoulder IR 2x10 7# ?Seated shoulder ER wand AAROM  on Rt 3x10 with 5-sec hold at end-range ?Wall slide flex/abd R shoulder 2x10  ?Prone row 3x10 3# R ?Prone ext 3x10 5# R ?Seated scaption 2x10 3# R ?Seated bilat ER 3x10 RTB ?S/L ER 2x10 3# - R shoulder ?S/L R shoulder abd 2x10 3# ?Shelf taps 2x10 3# R shoulder flexion ?Seated low rows to neutral shoulder extension with 15# cable 2x10 ?Manual Therapy: ?PROM into R shoulder abd/ER  ? ?[facility] Adult PT Treatment: DATE: 11/07/2021 ?Therapeutic Exercise: ?UBE lvl 1.0 x 4 min (fwd/bwd) while taking subjective ?Row 3x12 17# ?Shoulder extension 2x10 17# ?Rt IR/ER 2x10 3# ?Seated shoulder ER wand AAROM on Rt 3x10 with 5-sec hold at end-range ?Wall slide flex/abd R shoulder 2x10  ?Seated scaption 2x10 2# R ?Seated bilat ER 2x10 RTB ?Supine pball flexion AAROM x 15 - 5" hold ?S/L ER 2x15 2# - R shoulder ?S/L R shoulder abd 2x10 2# ?Seated low rows to neutral shoulder extension with 20# cable 2x10 ? ? ?PATIENT EDUCATION: ?Education details: Updated HEP, educated on progression into next phase of post-op protocol ?Person educated: Patient ?Education method: Explanation ?Education comprehension: verbalized understanding  ?  ?  ?HOME EXERCISE PROGRAM: ?Access Code: [email or web address] ?URL: [email or web address] ?Date: 10/05/2021 ?Prepared by: Vanessa Haines ? ?Exercises ?Seated Scapular Retraction - 2 x daily - 7 x weekly - 3 sets - 10 reps - 3 hold ?Seated elbow extension AROM stretch OVER KNEE - 2 x daily - 7 x weekly - 2 sets - 10 reps ?Seated Finger Composite Flexion with Putty - 2 x  daily - 7 x weekly - 3 sets - 20 reps ?Seated Shoulder Pendulum Exercise - 2 x daily - 7 x weekly - 3 sets - 20 reps ?Supine Shoulder Flexion AAROM - 2 x daily - 7 x weekly - 3 sets - 10 reps - 5 sec hold ? ?Added 2

## 2021-11-21 ENCOUNTER — Other Ambulatory Visit: Payer: Self-pay

## 2021-11-21 ENCOUNTER — Ambulatory Visit: Payer: Medicare HMO

## 2021-11-21 DIAGNOSIS — M25611 Stiffness of right shoulder, not elsewhere classified: Secondary | ICD-10-CM | POA: Diagnosis not present

## 2021-11-21 DIAGNOSIS — M6281 Muscle weakness (generalized): Secondary | ICD-10-CM

## 2021-11-21 DIAGNOSIS — G8929 Other chronic pain: Secondary | ICD-10-CM | POA: Diagnosis not present

## 2021-11-21 DIAGNOSIS — M25511 Pain in right shoulder: Secondary | ICD-10-CM | POA: Diagnosis not present

## 2021-11-21 NOTE — Therapy (Signed)
?OUTPATIENT PHYSICAL THERAPY TREATMENT NOTE ?  ? ?Patient Name: Christopher Wall, DDS ?MRN: [id number] ?DOB:May 06, 1946, 76 y.o., male ?Today's Date: 11/21/2021 ? ?PCP: McDiarmid, Blane Ohara, MD ?REFERRING PROVIDER: Sheryle Hail, PA-C ? ? PT End of Session - 11/21/21 1619   ? ? Visit Number 15   ? Number of Visits 17   ? Date for PT Re-Evaluation 12/03/21   ? Authorization Type Aetna MCR   ? Authorization Time Period FOTO v6, v10   ? Progress Note Due on Visit 20   ? PT Start Time 1616   ? PT Stop Time 5631   ? PT Time Calculation (min) 38 min   ? Activity Tolerance Patient tolerated treatment well   ? Behavior During Therapy [facility] for tasks assessed/performed   ? ?  ?  ? ?  ? ? ? ? ? ? ? ? ? ? ? ? ? ? ?Past Medical History:  ?Diagnosis Date  ? Abdominal wall hernia 03/20/2018  ? 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 Qualifier: History of  By: McDiarmid MD, Sherren Mocha    ? Cataract   ? bilateral cataracts with lens implant  ? Chest pain, atypical 12/05/2017  ? COLON POLYP 02/07/2014  ? Tubular Adenoma x 1 - Dr Ardis Hughs (GI)  ? Diverticulosis of colon 07/09/2018  ? Based on colonoscopy 06/2108  ? Encounter for neuropsychological testing 06/26/2015  ? [facility]: Only B/L avg in rote memory auditory info encoding. No mood dysfnc  ? Encounter for neuropsychological testing 2008  ? Vivien Rossetti (Neuropsy): Mixed anxiety and depression o/w normal.   ? Family history of esophageal cancer 12/26/2017  ? GASTROESOPHAGEAL REFLUX, NO ESOPHAGITIS 10/30/2006  ? Qualifier: History of  By: McDiarmid MD, Sherren Mocha    ? 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 HYPERLIPIDEMIA 10/30/2006  ? Qualifier: Diagnosis of  By: Drucie Ip    ? Hypertension 04/24/2009  ? Qualifier: Diagnosis of  By: McDiarmid MD, Sherren Mocha    ? Left flank pain 06/15/2018  ? Memory difficulties 06/09/2015  ? Prior neuropsychology testing in 2008 by Dr Valentina Shaggy (Neuropsychologist) was unremarkable other than mixed anxiety/depression.  Repeat testing 06/2015 at  [facility]: Only finding was below average in rote memory auditory info encoding. No mood dysfunction.   ? Overweight (BMI 25.0-29.9) 10/30/2006  ? Qualifier: History of  By: McDiarmid MD, Sherren Mocha    ? Prediabetes 12/21/2013  ? Pulmonary nodule, left 06/09/2015  ? Pure hypercholesterolemia 10/30/2006  ? ACC calculation 10-yr risk of CV event of 9.5% with recommendation to consider Statin therapy of moderate-to-high potency (12/21/13)    ? ?Past Surgical History:  ?Procedure Laterality Date  ? CATARACT EXTRACTION W/ INTRAOCULAR LENS  IMPLANT, BILATERAL    ? COCCYGECTOMY  1985  ? VASECTOMY    ? ?Patient Active Problem List  ? Diagnosis Date Noted  ? Rotator cuff tear 12/08/2020  ? Capsulitis of shoulder, right 11/06/2020  ? Shoulder injury, right, initial encounter 11/02/2020  ? Abdominal wall hernia 03/20/2018  ? 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 Prediabetes 12/21/2013  ? Pure hypercholesterolemia 10/30/2006  ? Obesity (BMI 30.0-34.9) 10/30/2006  ? GASTROESOPHAGEAL REFLUX, NO ESOPHAGITIS 10/30/2006  ? ? ?REFERRING DIAG: right shoulder RCR, SAD 08/20/21 ? ?THERAPY DIAG:  ?Chronic right shoulder pain ? ?Muscle weakness (generalized) ? ?Stiffness of right shoulder, not elsewhere classified ? ?PERTINENT HISTORY: Rt RCR, SAD on 08/20/2021 with Dr. Tamera Punt ? ?PRECAUTIONS: Shoulder, follow [facility] protocol in media ? ?SUBJECTIVE:  ?Pt presents  to PT with no current reports of shoulder pain. Has been compliant with HEP with no adverse effect. Pt is ready to begin PT at this time.  ? ?Pain: ?Are you having pain? No ?Numerical Pain Scale: 0/10 ?Pain Location: Rt shoulder ?Pain Frequency/Description: intermittent  ? ? ?OBJECTIVE:  ? *Unless otherwise noted, objective measures collected previously* ? ?PATIENT SURVEYS:  ?FOTO 63% on 10/22/2021 ?   ?UPPER EXTREMITY AROM/PROM: ?  ?A/PROM Right ?09/08/2021 Right ?09/21/2021 Right ?09/24/2021 Right ?09/28/2021 Right ?10/05/2021 Right ?10/22/2021 Right ?11/07/2021  Right ?11/12/2021  ?Shoulder flexion PROM: 85d, limited by dull pain at end range  PROM: 115, limited by pt reported tightness at end range PROM: 130, limited by pt reported tightness at end range PROM: 130,  ?limited by pt reported tightness at end range AROM: 145 AROM: 148 PROM: WNL 148 AROM  ?Shoulder extension            ?Shoulder abduction PROM: 75d, limited by dull pain at end range  PROM: 100d, limited by pt reported dull pain at end range PROM: 105d, limited by pt reported dull pain at end range PROM: 115d, limited by pt reported dull pain at end range AROM: 160 mild pain at end range AROM: 150 mild pain at end range PROM: 150 150 AROM  ?Shoulder adduction            ?Shoulder internal rotation PROM: 15d PROM: 45d  PROM: 50d  AROM: 60d AROM: 55d   T12  ?Shoulder external rotation PROM: 40d PROM: 50d  PROM: 70d  AROM: 65d AROM: 53d   C5  ?Elbow flexion AROM: 145          ?Elbow extension AROM: -15, limited by dull shoulder pain          ?(Blank rows = not tested) ?  ?UPPER EXTREMITY MMT: ?  ?MMT Right ?09/08/2021 Left ?09/08/2021 Right ?10/05/2021  ?Shoulder flexion 2+/5 mild p!   4+/5 mild tightness  ?Shoulder extension       ?Shoulder abduction 2+/5   5/5 mild p!  ?Shoulder ER 2+/5   4/5 mildp!  ?Shoulder IR 2+/5   5/5  ?Shoulder adduction 2+/5     ?Middle trapezius 4/5     ?Lower trapezius       ?Elbow flexion 4/5, mild tenderness   5/5  ?Elbow extension 5/5     ?Grip strength (Lbs) 74 79   ?(Blank rows = not tested) ?  ?  ?           ?TODAY'S TREATMENT:  ?[facility] Adult PT Treatment: DATE: 11/21/2021 ?Therapeutic Exercise: ?UBE lvl 2.0 x 4 min (fwd/bwd) while taking subjective ?Row 3x12 20# ?Shoulder extension 3x10 17# ?Rt shoulder ER 3x10 3# ?R shoulder IR 3x10 7# ?Supine D2 flex R 2x15 ?Supine serratus raise 2x10 YTB ?Seated shoulder ER wand AAROM on Rt 3x10 with 5-sec hold at end-range ?Pulley R shoulder IR x 60" ?Wall slide flex/abd R shoulder 2x10 each - 5" hold ?Seated scaption x 10 3# R ?S/L ER 2x10 3# - R  shoulder ?S/L R shoulder abd 2x10 3# ?Shelf taps 3x10 3# R shoulder flexion ?Seated low rows to neutral shoulder extension with 20# cable 2x10 ?Manual Therapy: ?PROM into R shoulder abd/ER  ? ?[facility] Adult PT Treatment: DATE: 11/19/2021 ?Therapeutic Exercise: ?UBE lvl 2.0 x 4 min (fwd/bwd) while taking subjective ?Row 3x15 17# ?Shoulder extension 3x10 17# ?Rt shoulder ER 3x10 3# ?R shoulder IR 3x10 7# ?Supine D2 flex R 2x15 ?Seated shoulder ER  wand AAROM on Rt 3x10 with 5-sec hold at end-range ?Pulley R shoulder IR x 60" ?Wall slide flex/abd R shoulder 2x10 each - 5" hold ?Seated scaption x 10 3# R ?S/L ER 2x10 3# - R shoulder ?S/L R shoulder abd 2x10 3# ?Shelf taps 3x10 3# R shoulder flexion ?Seated low rows to neutral shoulder extension with 20# cable 2x10 ?Manual Therapy: ?PROM into R shoulder abd/ER  ? ?[facility] Adult PT Treatment: DATE: 11/12/2021 ?Therapeutic Exercise: ?UBE lvl 1.0 x 4 min (fwd/bwd) while taking subjective ?Row 3x15 17# ?Shoulder extension 3x10 17# ?Rt shoulder ER 2x10 3# ?R shoulder IR 2x10 7# ?Seated shoulder ER wand AAROM on Rt 3x10 with 5-sec hold at end-range ?Wall slide flex/abd R shoulder 2x10  ?Prone row 3x10 3# R ?Prone ext 3x10 5# R ?Seated scaption 2x10 3# R ?Seated bilat ER 3x10 RTB ?S/L ER 2x10 3# - R shoulder ?S/L R shoulder abd 2x10 3# ?Shelf taps 2x10 3# R shoulder flexion ?Seated low rows to neutral shoulder extension with 15# cable 2x10 ?Manual Therapy: ?PROM into R shoulder abd/ER  ? ?[facility] Adult PT Treatment: DATE: 11/07/2021 ?Therapeutic Exercise: ?UBE lvl 1.0 x 4 min (fwd/bwd) while taking subjective ?Row 3x12 17# ?Shoulder extension 2x10 17# ?Rt IR/ER 2x10 3# ?Seated shoulder ER wand AAROM on Rt 3x10 with 5-sec hold at end-range ?Wall slide flex/abd R shoulder 2x10  ?Seated scaption 2x10 2# R ?Seated bilat ER 2x10 RTB ?Supine pball flexion AAROM x 15 - 5" hold ?S/L ER 2x15 2# - R shoulder ?S/L R shoulder abd 2x10 2# ?Seated low rows to neutral shoulder extension with 20# cable  2x10 ? ? ?PATIENT EDUCATION: ?Education details: Updated HEP, educated on progression into next phase of post-op protocol ?Person educated: Patient ?Education method: Explanation ?Education comprehension: verbal

## 2021-11-27 ENCOUNTER — Other Ambulatory Visit: Payer: Self-pay

## 2021-11-27 ENCOUNTER — Ambulatory Visit: Payer: Medicare HMO

## 2021-11-27 DIAGNOSIS — M25611 Stiffness of right shoulder, not elsewhere classified: Secondary | ICD-10-CM

## 2021-11-27 DIAGNOSIS — G8929 Other chronic pain: Secondary | ICD-10-CM

## 2021-11-27 DIAGNOSIS — M6281 Muscle weakness (generalized): Secondary | ICD-10-CM | POA: Diagnosis not present

## 2021-11-27 DIAGNOSIS — M25511 Pain in right shoulder: Secondary | ICD-10-CM | POA: Diagnosis not present

## 2021-11-27 NOTE — Therapy (Signed)
?OUTPATIENT PHYSICAL THERAPY TREATMENT NOTE ?  ? ?Patient Name: Christopher Wall, DDS ?MRN: [id number] ?DOB:[birth year], 76 y.o., male ?Today's Date: 11/27/2021 ? ?PCP: McDiarmid, Blane Ohara, MD ?REFERRING PROVIDER: McDiarmid, Blane Ohara, MD ? ? PT End of Session - 11/27/21 1657   ? ? Visit Number 16   ? Number of Visits 17   ? Date for PT Re-Evaluation 12/03/21   ? Authorization Type Aetna MCR   ? Authorization Time Period FOTO v6, v10   ? Progress Note Due on Visit 20   ? PT Start Time 1700   ? PT Stop Time 1740   ? PT Time Calculation (min) 40 min   ? Activity Tolerance Patient tolerated treatment well   ? Behavior During Therapy [facility] for tasks assessed/performed   ? ?  ?  ? ?  ? ? ? ? ? ? ? ? ? ? ? ? ? ? ? ?Past Medical History:  ?Diagnosis Date  ? Abdominal wall hernia 03/20/2018  ? 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 Qualifier: History of  By: McDiarmid MD, Sherren Mocha    ? Cataract   ? bilateral cataracts with lens implant  ? Chest pain, atypical 12/05/2017  ? COLON POLYP 02/07/2014  ? Tubular Adenoma x 1 - Dr Ardis Hughs (GI)  ? Diverticulosis of colon 07/09/2018  ? Based on colonoscopy 06/2108  ? Encounter for neuropsychological testing 06/26/2015  ? [facility]: Only B/L avg in rote memory auditory info encoding. No mood dysfnc  ? Encounter for neuropsychological testing 2008  ? Vivien Rossetti (Neuropsy): Mixed anxiety and depression o/w normal.   ? Family history of esophageal cancer 12/26/2017  ? GASTROESOPHAGEAL REFLUX, NO ESOPHAGITIS 10/30/2006  ? Qualifier: History of  By: McDiarmid MD, Sherren Mocha    ? 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 HYPERLIPIDEMIA 10/30/2006  ? Qualifier: Diagnosis of  By: Drucie Ip    ? Hypertension 04/24/2009  ? Qualifier: Diagnosis of  By: McDiarmid MD, Sherren Mocha    ? Left flank pain 06/15/2018  ? Memory difficulties 06/09/2015  ? Prior neuropsychology testing in 2008 by Dr Valentina Shaggy (Neuropsychologist) was unremarkable other than mixed anxiety/depression.  Repeat testing 06/2015 at  [facility]: Only finding was below average in rote memory auditory info encoding. No mood dysfunction.   ? Overweight (BMI 25.0-29.9) 10/30/2006  ? Qualifier: History of  By: McDiarmid MD, Sherren Mocha    ? Prediabetes 12/21/2013  ? Pulmonary nodule, left 06/09/2015  ? Pure hypercholesterolemia 10/30/2006  ? ACC calculation 10-yr risk of CV event of 9.5% with recommendation to consider Statin therapy of moderate-to-high potency (12/21/13)    ? ?Past Surgical History:  ?Procedure Laterality Date  ? CATARACT EXTRACTION W/ INTRAOCULAR LENS  IMPLANT, BILATERAL    ? COCCYGECTOMY  1985  ? VASECTOMY    ? ?Patient Active Problem List  ? Diagnosis Date Noted  ? Rotator cuff tear 12/08/2020  ? Capsulitis of shoulder, right 11/06/2020  ? Shoulder injury, right, initial encounter 11/02/2020  ? Abdominal wall hernia 03/20/2018  ? 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 Prediabetes 12/21/2013  ? Pure hypercholesterolemia 10/30/2006  ? Obesity (BMI 30.0-34.9) 10/30/2006  ? GASTROESOPHAGEAL REFLUX, NO ESOPHAGITIS 10/30/2006  ? ? ?REFERRING DIAG: right shoulder RCR, SAD 08/20/21 ? ?THERAPY DIAG:  ?Chronic right shoulder pain ? ?Muscle weakness (generalized) ? ?Stiffness of right shoulder, not elsewhere classified ? ?PERTINENT HISTORY: Rt RCR, SAD on 08/20/2021 with Dr. Tamera Punt ? ?PRECAUTIONS: Shoulder, follow [facility] protocol in media ? ?SUBJECTIVE:  ?  Pt presents to PT with reports of no current shoulder pain. Was bothering him the previous day but is feeling better now. Pt is ready to begin PT at this time.   ? ?Pain: ?Are you having pain? No ?Numerical Pain Scale: 0/10 ?Pain Location: Rt shoulder ?Pain Frequency/Description: intermittent  ? ? ?OBJECTIVE:  ? *Unless otherwise noted, objective measures collected previously* ? ?PATIENT SURVEYS:  ?FOTO 63% on 10/22/2021 ?   ?UPPER EXTREMITY AROM/PROM: ?  ?A/PROM Right ?09/08/2021 Right ?09/21/2021 Right ?09/24/2021 Right ?09/28/2021 Right ?10/05/2021 Right ?10/22/2021  Right ?11/07/2021 Right ?11/12/2021  ?Shoulder flexion PROM: 85d, limited by dull pain at end range  PROM: 115, limited by pt reported tightness at end range PROM: 130, limited by pt reported tightness at end range PROM: 130,  ?limited by pt reported tightness at end range AROM: 145 AROM: 148 PROM: WNL 148 AROM  ?Shoulder extension            ?Shoulder abduction PROM: 75d, limited by dull pain at end range  PROM: 100d, limited by pt reported dull pain at end range PROM: 105d, limited by pt reported dull pain at end range PROM: 115d, limited by pt reported dull pain at end range AROM: 160 mild pain at end range AROM: 150 mild pain at end range PROM: 150 150 AROM  ?Shoulder adduction            ?Shoulder internal rotation PROM: 15d PROM: 45d  PROM: 50d  AROM: 60d AROM: 55d   T12  ?Shoulder external rotation PROM: 40d PROM: 50d  PROM: 70d  AROM: 65d AROM: 53d   C5  ?Elbow flexion AROM: 145          ?Elbow extension AROM: -15, limited by dull shoulder pain          ?(Blank rows = not tested) ?  ?UPPER EXTREMITY MMT: ?  ?MMT Right ?09/08/2021 Left ?09/08/2021 Right ?10/05/2021  ?Shoulder flexion 2+/5 mild p!   4+/5 mild tightness  ?Shoulder extension       ?Shoulder abduction 2+/5   5/5 mild p!  ?Shoulder ER 2+/5   4/5 mildp!  ?Shoulder IR 2+/5   5/5  ?Shoulder adduction 2+/5     ?Middle trapezius 4/5     ?Lower trapezius       ?Elbow flexion 4/5, mild tenderness   5/5  ?Elbow extension 5/5     ?Grip strength (Lbs) 74 79   ?(Blank rows = not tested) ?  ?  ?           ?TODAY'S TREATMENT:  ?[facility] Adult PT Treatment: DATE: 11/27/2021 ?Therapeutic Exercise: ?UBE lvl 2.0 x 4 min (fwd/bwd) while taking subjective ?Row 3x12 20# ?Shoulder extension 3x10 20# ?Rt shoulder ER 3x10 3# ?R shoulder IR 3x10 7# ?Rt single arm row with ER 2x10 RTB ?Seated bilat ER w/ scap retraction 2x15 RTB ?Supine D2 flex R 2x15 ?Supine serratus raise 2x10 RTB ?Pulley R shoulder IR x 60" ?S/L ER 2x15 3# - R shoulder ?Shelf taps 3x10 3# R shoulder flexion ?Body  blade flex/ext pronated grip x 30" ?Body blade IR/ER x 30" ?Seated low rows to neutral shoulder extension with 25# cable 2x10 ? ?[facility] Adult PT Treatment: DATE: 11/21/2021 ?Therapeutic Exercise: ?UBE lvl 2.0 x 4 min (fwd/bwd) while taking subjective ?Row 3x12 20# ?Shoulder extension 3x10 17# ?Rt shoulder ER 3x10 3# ?R shoulder IR 3x10 7# ?Supine D2 flex R 2x15 ?Supine serratus raise 2x10 YTB ?Seated shoulder ER wand AAROM on Rt 3x10  with 5-sec hold at end-range ?Pulley R shoulder IR x 60" ?Wall slide flex/abd R shoulder 2x10 each - 5" hold ?Seated scaption x 10 3# R ?S/L ER 2x10 3# - R shoulder ?S/L R shoulder abd 2x10 3# ?Shelf taps 3x10 3# R shoulder flexion ?Seated low rows to neutral shoulder extension with 20# cable 2x10 ?Manual Therapy: ?PROM into R shoulder abd/ER  ? ?[facility] Adult PT Treatment: DATE: 11/19/2021 ?Therapeutic Exercise: ?UBE lvl 2.0 x 4 min (fwd/bwd) while taking subjective ?Row 3x15 17# ?Shoulder extension 3x10 17# ?Rt shoulder ER 3x10 3# ?R shoulder IR 3x10 7# ?Supine D2 flex R 2x15 ?Seated shoulder ER wand AAROM on Rt 3x10 with 5-sec hold at end-range ?Pulley R shoulder IR x 60" ?Wall slide flex/abd R shoulder 2x10 each - 5" hold ?Seated scaption x 10 3# R ?S/L ER 2x10 3# - R shoulder ?S/L R shoulder abd 2x10 3# ?Shelf taps 3x10 3# R shoulder flexion ?Seated low rows to neutral shoulder extension with 20# cable 2x10 ?Manual Therapy: ?PROM into R shoulder abd/ER  ? ?PATIENT EDUCATION: ?Education details: Updated HEP, educated on progression into next phase of post-op protocol ?Person educated: Patient ?Education method: Explanation ?Education comprehension: verbalized understanding  ?  ?  ?HOME EXERCISE PROGRAM: ?Access Code: [email or web address] ?URL: [email or web address] ?Date: 10/05/2021 ?Prepared by: Vanessa Yaurel ? ?Exercises ?Seated Scapular Retraction - 2 x daily - 7 x weekly - 3 sets - 10 reps - 3 hold ?Seated elbow extension AROM stretch OVER KNEE - 2 x daily - 7 x weekly - 2 sets -  10 reps ?Seated Finger Composite Flexion with Putty - 2 x daily - 7 x weekly - 3 sets - 20 reps ?Seated Shoulder Pendulum Exercise - 2 x daily - 7 x weekly - 3 sets - 20 reps ?Supine Shoulder Flexion AAROM - 2 x

## 2021-11-29 ENCOUNTER — Ambulatory Visit (INDEPENDENT_AMBULATORY_CARE_PROVIDER_SITE_OTHER): Payer: Medicare HMO | Admitting: Family Medicine

## 2021-11-29 ENCOUNTER — Encounter: Payer: Self-pay | Admitting: Family Medicine

## 2021-11-29 VITALS — BP 141/85 | HR 76 | Ht 68.0 in | Wt 199.0 lb

## 2021-11-29 DIAGNOSIS — I270 Primary pulmonary hypertension: Secondary | ICD-10-CM

## 2021-11-29 DIAGNOSIS — R7303 Prediabetes: Secondary | ICD-10-CM | POA: Diagnosis not present

## 2021-11-29 DIAGNOSIS — E669 Obesity, unspecified: Secondary | ICD-10-CM

## 2021-11-29 DIAGNOSIS — E7800 Pure hypercholesterolemia, unspecified: Secondary | ICD-10-CM

## 2021-11-29 DIAGNOSIS — N528 Other male erectile dysfunction: Secondary | ICD-10-CM | POA: Diagnosis not present

## 2021-11-29 DIAGNOSIS — Z79899 Other long term (current) drug therapy: Secondary | ICD-10-CM

## 2021-11-29 DIAGNOSIS — I10 Essential (primary) hypertension: Secondary | ICD-10-CM

## 2021-11-29 LAB — POCT GLYCOSYLATED HEMOGLOBIN (HGB A1C): HbA1c, POC (prediabetic range): 6.1 % (ref 5.7–6.4)

## 2021-11-29 MED ORDER — SILDENAFIL CITRATE 100 MG PO TABS: 100.0000 mg | ORAL_TABLET | Freq: Every day | ORAL | 0 refills | Status: DC | PRN

## 2021-11-29 MED ORDER — LISINOPRIL-HYDROCHLOROTHIAZIDE 20-25 MG PO TABS: 1.0000 | ORAL_TABLET | Freq: Every day | ORAL | 3 refills | Status: DC

## 2021-11-29 NOTE — Patient Instructions (Signed)
Please increase the Lisinopril-HCTZ to 20-25 tablet, taking one tablet daily. ? ?Please check your blood pressure with goal of staying mostly below Systolic 536, ideally less than 130.   ? ?Start Viagra 100 mg tablet, half-tablet an hour before relations.  May repeat in 2 hours.  If half tablet is inadequate, take a whole tablet.  ? ? ?

## 2021-11-30 ENCOUNTER — Encounter: Payer: Self-pay | Admitting: Family Medicine

## 2021-11-30 LAB — CBC WITH DIFFERENTIAL/PLATELET
Basophils Absolute: 0 10*3/uL (ref 0.0–0.2)
Basos: 1 %
EOS (ABSOLUTE): 0.1 10*3/uL (ref 0.0–0.4)
Eos: 1 %
Hematocrit: 44.4 % (ref 37.5–51.0)
Hemoglobin: 15.2 g/dL (ref 13.0–17.7)
Immature Grans (Abs): 0 10*3/uL (ref 0.0–0.1)
Immature Granulocytes: 0 %
Lymphocytes Absolute: 2.8 10*3/uL (ref 0.7–3.1)
Lymphs: 37 %
MCH: 27.5 pg (ref 26.6–33.0)
MCHC: 34.2 g/dL (ref 31.5–35.7)
MCV: 80 fL (ref 79–97)
Monocytes Absolute: 0.6 10*3/uL (ref 0.1–0.9)
Monocytes: 8 %
Neutrophils Absolute: 4 10*3/uL (ref 1.4–7.0)
Neutrophils: 53 %
Platelets: 222 10*3/uL (ref 150–450)
RBC: 5.53 x10E6/uL (ref 4.14–5.80)
RDW: 12.7 % (ref 11.6–15.4)
WBC: 7.5 10*3/uL (ref 3.4–10.8)

## 2021-11-30 LAB — LIPID PANEL
Chol/HDL Ratio: 4.5 ratio (ref 0.0–5.0)
Cholesterol, Total: 256 mg/dL — ABNORMAL HIGH (ref 100–199)
HDL: 57 mg/dL (ref >39)
LDL Chol Calc (NIH): 150 mg/dL — ABNORMAL HIGH (ref 0–99)
Triglycerides: 270 mg/dL — ABNORMAL HIGH (ref 0–149)
VLDL Cholesterol Cal: 49 mg/dL — ABNORMAL HIGH (ref 5–40)

## 2021-11-30 LAB — BASIC METABOLIC PANEL
BUN/Creatinine Ratio: 20 (ref 10–24)
BUN: 20 mg/dL (ref 8–27)
CO2: 25 mmol/L (ref 20–29)
Calcium: 10.2 mg/dL (ref 8.6–10.2)
Chloride: 100 mmol/L (ref 96–106)
Creatinine, Ser: 1.02 mg/dL (ref 0.76–1.27)
Glucose: 108 mg/dL — ABNORMAL HIGH (ref 70–99)
Potassium: 4.3 mmol/L (ref 3.5–5.2)
Sodium: 141 mmol/L (ref 134–144)
eGFR: 77 mL/min/{1.73_m2} (ref >59)

## 2021-11-30 NOTE — Assessment & Plan Note (Addendum)
Checking lipids ?ASCVD risk > 20% ?

## 2021-11-30 NOTE — Assessment & Plan Note (Signed)
Established problem Well Controlled. No signs of complications, medication side effects, or red flags. Continue current medications and other regiments.  

## 2021-11-30 NOTE — Progress Notes (Signed)
Christopher Wall, DDS is alone ?Sources of clinical information for visit is/are patient. ?Nursing assessment for this office visit was reviewed with the patient for accuracy and revision.  ? ? ? ?Previous Report(s) Reviewed: none  ? ?  11/29/2021  ? 10:02 AM  ?Depression screen PHQ 2/9  ?Decreased Interest 0  ?Down, Depressed, Hopeless 0  ?PHQ - 2 Score 0  ?Altered sleeping 1  ?Tired, decreased energy 2  ?Change in appetite 0  ?Feeling bad or failure about yourself  0  ?Trouble concentrating 0  ?Moving slowly or fidgety/restless 1  ?Suicidal thoughts 0  ?PHQ-9 Score 4  ?Difficult doing work/chores Not difficult at all  ? ?[place] Office Visit from 11/29/2021 in [place]  ?Thoughts that you would be better off dead, or of hurting yourself in some way Not at all  ?PHQ-9 Total Score 4  ? ?  ?  ? ?  11/29/2021  ? 10:02 AM 11/02/2020  ? 11:11 AM 04/02/2018  ?  8:52 AM 03/20/2018  ?  9:39 AM 12/05/2017  ? 11:11 AM  ?Fall Risk   ?Falls in the past year? 0 0 No No No  ?Number falls in past yr: 0 0     ?Injury with Fall? 0 0     ? ? ? ?  11/29/2021  ? 10:02 AM 04/02/2018  ?  8:52 AM 03/20/2018  ?  9:39 AM  ?PHQ9 SCORE ONLY  ?PHQ-9 Total Score 4 0 0  ? ? ?Adult vaccines due  ?Topic Date Due  ? TETANUS/TDAP  04/14/2028  ? ? ?Health Maintenance Due  ?Topic Date Due  ? Zoster Vaccines- Shingrix (2 of 2) 06/09/2018  ? INFLUENZA VACCINE  04/02/2021  ?  ? ? ?History/P.E. limitations: none ? ?Adult vaccines due  ?Topic Date Due  ? TETANUS/TDAP  04/14/2028  ? There are no preventive care reminders to display for this patient.  ?Health Maintenance Due  ?Topic Date Due  ? Zoster Vaccines- Shingrix (2 of 2) 06/09/2018  ? INFLUENZA VACCINE  04/02/2021  ?  ? ?Chief Complaint  ?Patient presents with  ? Annual Exam  ?  ? ?

## 2021-11-30 NOTE — Assessment & Plan Note (Signed)
Wt Readings from Last 3 Encounters:  ?11/29/21 199 lb (90.3 kg)  ?02/28/21 185 lb (83.9 kg)  ?12/08/20 198 lb (89.8 kg)  ?Weight gain since summer last year. ?Unable to perform usual strenuous activities [place] because of right rotator cuff tear.   ?Recommend tolerable physical activity ? ?

## 2021-11-30 NOTE — Assessment & Plan Note (Signed)
Lab Results  ?Component Value Date  ? HGBA1C 6.1 11/29/2021  ? HGBA1C 5.6 11/02/2020  ? HGBA1C 5.8 12/05/2017  ? ?Remains in Pre-diabetes range.  Half-percent increase in A1c since last year likely represents true increase in insulin resistance.  Lifestyle initial changes recommended.  ? ?

## 2021-12-03 ENCOUNTER — Ambulatory Visit: Payer: Medicare HMO | Attending: Surgical

## 2021-12-03 DIAGNOSIS — G8929 Other chronic pain: Secondary | ICD-10-CM | POA: Insufficient documentation

## 2021-12-03 DIAGNOSIS — M25611 Stiffness of right shoulder, not elsewhere classified: Secondary | ICD-10-CM | POA: Insufficient documentation

## 2021-12-03 DIAGNOSIS — M6281 Muscle weakness (generalized): Secondary | ICD-10-CM | POA: Insufficient documentation

## 2021-12-03 DIAGNOSIS — M25511 Pain in right shoulder: Secondary | ICD-10-CM | POA: Diagnosis not present

## 2021-12-03 NOTE — Therapy (Signed)
?OUTPATIENT PHYSICAL THERAPY TREATMENT NOTE ?  ? ?Patient Name: Christopher Wall, DDS ?MRN: [id number] ?DOB:[birth year], 76 y.o., male ?Today's Date: 12/03/2021 ? ?PCP: McDiarmid, Blane Ohara, MD ?REFERRING PROVIDER: McDiarmid, Blane Ohara, MD ? ? PT End of Session - 12/03/21 0829   ? ? Visit Number 17   ? Number of Visits 20   ? Date for PT Re-Evaluation 12/31/21   ? Authorization Type Aetna MCR   ? Authorization Time Period FOTO v6, v10   ? Progress Note Due on Visit 20   ? PT Start Time 0830   ? PT Stop Time 1761   ? PT Time Calculation (min) 38 min   ? Activity Tolerance Patient tolerated treatment well   ? Behavior During Therapy [facility] for tasks assessed/performed   ? ?  ?  ? ?  ? ? ? ? ? ? ? ? ? ? ? ? ? ? ? ? ?Past Medical History:  ?Diagnosis Date  ? Abdominal wall hernia 03/20/2018  ? 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 Qualifier: History of  By: McDiarmid MD, Sherren Mocha    ? Cataract   ? bilateral cataracts with lens implant  ? Chest pain, atypical 12/05/2017  ? COLON POLYP 02/07/2014  ? Tubular Adenoma x 1 - Dr Ardis Hughs (GI)  ? Diverticulosis of colon 07/09/2018  ? Based on colonoscopy 06/2108  ? Encounter for neuropsychological testing 06/26/2015  ? [facility]: Only B/L avg in rote memory auditory info encoding. No mood dysfnc  ? Encounter for neuropsychological testing 2008  ? Vivien Rossetti (Neuropsy): Mixed anxiety and depression o/w normal.   ? Family history of esophageal cancer 12/26/2017  ? GASTROESOPHAGEAL REFLUX, NO ESOPHAGITIS 10/30/2006  ? Qualifier: History of  By: McDiarmid MD, Sherren Mocha    ? 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 HYPERLIPIDEMIA 10/30/2006  ? Qualifier: Diagnosis of  By: Drucie Ip    ? Hypertension 04/24/2009  ? Qualifier: Diagnosis of  By: McDiarmid MD, Sherren Mocha    ? Left flank pain 06/15/2018  ? Memory difficulties 06/09/2015  ? Prior neuropsychology testing in 2008 by Dr Valentina Shaggy (Neuropsychologist) was unremarkable other than mixed anxiety/depression.  Repeat testing 06/2015 at  [facility]: Only finding was below average in rote memory auditory info encoding. No mood dysfunction.   ? Overweight (BMI 25.0-29.9) 10/30/2006  ? Qualifier: History of  By: McDiarmid MD, Sherren Mocha    ? Prediabetes 12/21/2013  ? Pulmonary nodule, left 06/09/2015  ? Pure hypercholesterolemia 10/30/2006  ? ACC calculation 10-yr risk of CV event of 9.5% with recommendation to consider Statin therapy of moderate-to-high potency (12/21/13)    ? Rotator cuff tear 12/08/2020  ? Shoulder injury, right, initial encounter 11/02/2020  ? ?Past Surgical History:  ?Procedure Laterality Date  ? CATARACT EXTRACTION W/ INTRAOCULAR LENS  IMPLANT, BILATERAL    ? COCCYGECTOMY  1985  ? VASECTOMY    ? ?Patient Active Problem List  ? Diagnosis Date Noted  ? Abdominal wall hernia 03/20/2018  ? 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 Prediabetes 12/21/2013  ? Essential hypertension 04/24/2009  ? Pure hypercholesterolemia 10/30/2006  ? Obesity (BMI 30.0-34.9) 10/30/2006  ? GASTROESOPHAGEAL REFLUX, NO ESOPHAGITIS 10/30/2006  ? ? ?REFERRING DIAG: right shoulder RCR, SAD 08/20/21 ? ?THERAPY DIAG:  ?Chronic right shoulder pain - Plan: PT plan of care cert/re-cert ? ?Muscle weakness (generalized) - Plan: PT plan of care cert/re-cert ? ?Stiffness of right shoulder, not elsewhere classified - Plan: PT plan of care cert/re-cert ? ?PERTINENT HISTORY:  Rt RCR, SAD on 08/20/2021 with Dr. Tamera Punt ? ?PRECAUTIONS: Shoulder, follow [facility] protocol in media ? ?SUBJECTIVE:  ?Pt presents to PT with no reports of current shoulder pain. Has been compliant with HEP with no adverse effect. Pt is ready to begin PT at this time. ? ?Pain: ?Are you having pain? No ?Numerical Pain Scale: 0/10 ?Pain Location: Rt shoulder ?Pain Frequency/Description: intermittent  ? ? ?OBJECTIVE:  ? *Unless otherwise noted, objective measures collected previously* ? ?PATIENT SURVEYS:  ?FOTO 63% on 10/22/2021 ?   ?UPPER EXTREMITY AROM/PROM: ?  ?A/PROM Right ?09/08/2021  Right ?09/21/2021 Right ?09/24/2021 Right ?09/28/2021 Right ?10/05/2021 Right ?10/22/2021 Right ?11/07/2021 Right ?11/12/2021  ?Shoulder flexion PROM: 85d, limited by dull pain at end range  PROM: 115, limited by pt reported tightness at end range PROM: 130, limited by pt reported tightness at end range PROM: 130,  ?limited by pt reported tightness at end range AROM: 145 AROM: 148 PROM: WNL 148 AROM  ?Shoulder extension            ?Shoulder abduction PROM: 75d, limited by dull pain at end range  PROM: 100d, limited by pt reported dull pain at end range PROM: 105d, limited by pt reported dull pain at end range PROM: 115d, limited by pt reported dull pain at end range AROM: 160 mild pain at end range AROM: 150 mild pain at end range PROM: 150 150 AROM  ?Shoulder adduction            ?Shoulder internal rotation PROM: 15d PROM: 45d  PROM: 50d  AROM: 60d AROM: 55d   T12  ?Shoulder external rotation PROM: 40d PROM: 50d  PROM: 70d  AROM: 65d AROM: 53d   C5  ?Elbow flexion AROM: 145          ?Elbow extension AROM: -15, limited by dull shoulder pain          ?(Blank rows = not tested) ?  ?UPPER EXTREMITY MMT: ?  ?MMT Right ?09/08/2021 Left ?09/08/2021 Right ?10/05/2021  ?Shoulder flexion 2+/5 mild p!   4+/5 mild tightness  ?Shoulder extension       ?Shoulder abduction 2+/5   5/5 mild p!  ?Shoulder ER 2+/5   4/5 mildp!  ?Shoulder IR 2+/5   5/5  ?Shoulder adduction 2+/5     ?Middle trapezius 4/5     ?Lower trapezius       ?Elbow flexion 4/5, mild tenderness   5/5  ?Elbow extension 5/5     ?Grip strength (Lbs) 74 79   ?(Blank rows = not tested) ?  ?  ?           ?TODAY'S TREATMENT:  ?[facility] Adult PT Treatment: DATE: 12/03/2021 ?Therapeutic Exercise: ?UBE lvl 2.0 x 4 min (fwd/bwd) while taking subjective ?Row 3x12 20# ?Shoulder extension 3x10 20# ?Rt shoulder ER 3x10 3# ?R shoulder IR 3x10 7# ?Rt single arm row with ER 3x10 RTB ?Supine serratus raise 2x10 YTB ?S/L ER 2x15 3# - R shoulder ?Shelf taps 3x10 3# R shoulder flexion ?Body blade flex/ext  pronated grip x 30" ?Body blade IR/ER x 30" ?Seated low rows to neutral shoulder extension with 25# cable 2x12 ?Seated lat pulldown 2x10 25# ?Lateral wall walk YTB x 5 ?Wall slide R shoulder flex/abd x 10 each ?Pulleys R shoulder abd x 2 min ? ?[facility] Adult PT Treatment: DATE: 11/27/2021 ?Therapeutic Exercise: ?UBE lvl 2.0 x 4 min (fwd/bwd) while taking subjective ?Row 3x12 20# ?Shoulder extension 3x10 20# ?Rt shoulder ER 3x10 3# ?  R shoulder IR 3x10 7# ?Rt single arm row with ER 2x10 RTB ?Seated bilat ER w/ scap retraction 2x15 RTB ?Supine D2 flex R 2x15 ?Supine serratus raise 2x10 RTB ?Pulley R shoulder IR x 60" ?S/L ER 2x15 3# - R shoulder ?Shelf taps 3x10 3# R shoulder flexion ?Body blade flex/ext pronated grip x 30" ?Body blade IR/ER x 30" ?Seated low rows to neutral shoulder extension with 25# cable 2x10 ? ?[facility] Adult PT Treatment: DATE: 11/21/2021 ?Therapeutic Exercise: ?UBE lvl 2.0 x 4 min (fwd/bwd) while taking subjective ?Row 3x12 20# ?Shoulder extension 3x10 17# ?Rt shoulder ER 3x10 3# ?R shoulder IR 3x10 7# ?Supine D2 flex R 2x15 ?Supine serratus raise 2x10 YTB ?Seated shoulder ER wand AAROM on Rt 3x10 with 5-sec hold at end-range ?Pulley R shoulder IR x 60" ?Wall slide flex/abd R shoulder 2x10 each - 5" hold ?Seated scaption x 10 3# R ?S/L ER 2x10 3# - R shoulder ?S/L R shoulder abd 2x10 3# ?Shelf taps 3x10 3# R shoulder flexion ?Seated low rows to neutral shoulder extension with 20# cable 2x10 ?Manual Therapy: ?PROM into R shoulder abd/ER  ? ?PATIENT EDUCATION: ?Education details: Updated HEP, educated on progression into next phase of post-op protocol ?Person educated: Patient ?Education method: Explanation ?Education comprehension: verbalized understanding  ?  ?  ?HOME EXERCISE PROGRAM: ?Access Code: [email or web address] ?URL: [email or web address] ?Date: 10/05/2021 ?Prepared by: Vanessa Bristol ? ?Exercises ?Seated Scapular Retraction - 2 x daily - 7 x weekly - 3 sets - 10 reps - 3 hold ?Seated elbow  extension AROM stretch OVER KNEE - 2 x daily - 7 x weekly - 2 sets - 10 reps ?Seated Finger Composite Flexion with Putty - 2 x daily - 7 x weekly - 3 sets - 20 reps ?Seated Shoulder Pendulum Exercise - 2

## 2021-12-10 ENCOUNTER — Ambulatory Visit: Payer: Medicare HMO

## 2021-12-10 DIAGNOSIS — G8929 Other chronic pain: Secondary | ICD-10-CM | POA: Diagnosis not present

## 2021-12-10 DIAGNOSIS — M25611 Stiffness of right shoulder, not elsewhere classified: Secondary | ICD-10-CM

## 2021-12-10 DIAGNOSIS — M6281 Muscle weakness (generalized): Secondary | ICD-10-CM

## 2021-12-10 DIAGNOSIS — M25511 Pain in right shoulder: Secondary | ICD-10-CM | POA: Diagnosis not present

## 2021-12-10 NOTE — Therapy (Signed)
?OUTPATIENT PHYSICAL THERAPY TREATMENT NOTE ?  ? ?Patient Name: Christopher Wall, DDS ?MRN: [id number] ?DOB:1946/04/30, 76 y.o., male ?Today's Date: 12/10/2021 ? ?PCP: McDiarmid, Blane Ohara, MD ?REFERRING PROVIDER: McDiarmid, Blane Ohara, MD ? ? PT End of Session - 12/10/21 0913   ? ? Visit Number 18   ? Number of Visits 20   ? Date for PT Re-Evaluation 12/31/21   ? Authorization Type Aetna MCR   ? Authorization Time Period FOTO v6, v10   ? Progress Note Due on Visit 20   ? PT Start Time 6213   ? PT Stop Time [phone]   ? PT Time Calculation (min) 38 min   ? Activity Tolerance Patient tolerated treatment well   ? Behavior During Therapy [facility] for tasks assessed/performed   ? ?  ?  ? ?  ? ? ? ? ? ? ? ? ? ? ? ? ? ? ? ? ? ?Past Medical History:  ?Diagnosis Date  ? Abdominal wall hernia 03/20/2018  ? 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 Qualifier: History of  By: McDiarmid MD, Sherren Mocha    ? Cataract   ? bilateral cataracts with lens implant  ? Chest pain, atypical 12/05/2017  ? COLON POLYP 02/07/2014  ? Tubular Adenoma x 1 - Dr Ardis Hughs (GI)  ? Diverticulosis of colon 07/09/2018  ? Based on colonoscopy 06/2108  ? Encounter for neuropsychological testing 06/26/2015  ? [facility]: Only B/L avg in rote memory auditory info encoding. No mood dysfnc  ? Encounter for neuropsychological testing 2008  ? Vivien Rossetti (Neuropsy): Mixed anxiety and depression o/w normal.   ? Family history of esophageal cancer 12/26/2017  ? GASTROESOPHAGEAL REFLUX, NO ESOPHAGITIS 10/30/2006  ? Qualifier: History of  By: McDiarmid MD, Sherren Mocha    ? 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 HYPERLIPIDEMIA 10/30/2006  ? Qualifier: Diagnosis of  By: Drucie Ip    ? Hypertension 04/24/2009  ? Qualifier: Diagnosis of  By: McDiarmid MD, Sherren Mocha    ? Left flank pain 06/15/2018  ? Memory difficulties 06/09/2015  ? Prior neuropsychology testing in 2008 by Dr Valentina Shaggy (Neuropsychologist) was unremarkable other than mixed anxiety/depression.  Repeat testing 06/2015  at [facility]: Only finding was below average in rote memory auditory info encoding. No mood dysfunction.   ? Overweight (BMI 25.0-29.9) 10/30/2006  ? Qualifier: History of  By: McDiarmid MD, Sherren Mocha    ? Prediabetes 12/21/2013  ? Pulmonary nodule, left 06/09/2015  ? Pure hypercholesterolemia 10/30/2006  ? ACC calculation 10-yr risk of CV event of 9.5% with recommendation to consider Statin therapy of moderate-to-high potency (12/21/13)    ? Rotator cuff tear 12/08/2020  ? Shoulder injury, right, initial encounter 11/02/2020  ? ?Past Surgical History:  ?Procedure Laterality Date  ? CATARACT EXTRACTION W/ INTRAOCULAR LENS  IMPLANT, BILATERAL    ? COCCYGECTOMY  1985  ? VASECTOMY    ? ?Patient Active Problem List  ? Diagnosis Date Noted  ? Abdominal wall hernia 03/20/2018  ? 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 Prediabetes 12/21/2013  ? Essential hypertension 04/24/2009  ? Pure hypercholesterolemia 10/30/2006  ? Obesity (BMI 30.0-34.9) 10/30/2006  ? GASTROESOPHAGEAL REFLUX, NO ESOPHAGITIS 10/30/2006  ? ? ?REFERRING DIAG: right shoulder RCR, SAD 08/20/21 ? ?THERAPY DIAG:  ?Chronic right shoulder pain ? ?Muscle weakness (generalized) ? ?Stiffness of right shoulder, not elsewhere classified ? ?PERTINENT HISTORY: Rt RCR, SAD on 08/20/2021 with Dr. Tamera Punt ? ?PRECAUTIONS: Shoulder, follow [facility] protocol in media ? ?SUBJECTIVE:  ?  Pt presents to PT with no current reports of pain or discomfort. Pt has been compliant with HEP with no adverse effect and notes he is reaching above head much better. Pt is ready to begin PT at this time.  ? ?Pain: ?Are you having pain? No ?Numerical Pain Scale: 0/10 ?Pain Location: Rt shoulder ?Pain Frequency/Description: intermittent  ? ? ?OBJECTIVE:  ? *Unless otherwise noted, objective measures collected previously* ? ?PATIENT SURVEYS:  ?FOTO 63% on 10/22/2021 ?   ?UPPER EXTREMITY AROM/PROM: ?  ?A/PROM Right ?09/08/2021 Right ?09/21/2021 Right ?09/24/2021 Right ?09/28/2021  Right ?10/05/2021 Right ?10/22/2021 Right ?11/07/2021 Right ?11/12/2021  ?Shoulder flexion PROM: 85d, limited by dull pain at end range  PROM: 115, limited by pt reported tightness at end range PROM: 130, limited by pt reported tightness at end range PROM: 130,  ?limited by pt reported tightness at end range AROM: 145 AROM: 148 PROM: WNL 148 AROM  ?Shoulder extension            ?Shoulder abduction PROM: 75d, limited by dull pain at end range  PROM: 100d, limited by pt reported dull pain at end range PROM: 105d, limited by pt reported dull pain at end range PROM: 115d, limited by pt reported dull pain at end range AROM: 160 mild pain at end range AROM: 150 mild pain at end range PROM: 150 150 AROM  ?Shoulder adduction            ?Shoulder internal rotation PROM: 15d PROM: 45d  PROM: 50d  AROM: 60d AROM: 55d   T12  ?Shoulder external rotation PROM: 40d PROM: 50d  PROM: 70d  AROM: 65d AROM: 53d   C5  ?Elbow flexion AROM: 145          ?Elbow extension AROM: -15, limited by dull shoulder pain          ?(Blank rows = not tested) ?  ?UPPER EXTREMITY MMT: ?  ?MMT Right ?09/08/2021 Left ?09/08/2021 Right ?10/05/2021  ?Shoulder flexion 2+/5 mild p!   4+/5 mild tightness  ?Shoulder extension       ?Shoulder abduction 2+/5   5/5 mild p!  ?Shoulder ER 2+/5   4/5 mildp!  ?Shoulder IR 2+/5   5/5  ?Shoulder adduction 2+/5     ?Middle trapezius 4/5     ?Lower trapezius       ?Elbow flexion 4/5, mild tenderness   5/5  ?Elbow extension 5/5     ?Grip strength (Lbs) 74 79   ?(Blank rows = not tested) ?  ?  ?           ?TODAY'S TREATMENT:  ?[facility] Adult PT Treatment: DATE: 12/10/2021 ?Therapeutic Exercise: ?UBE lvl 2.0 x 4 min (fwd/bwd) while taking subjective ?Row 3x12 23# ?Shoulder extension 3x10 23# ?Rt shoulder ER/IR walkout 7# x 5 each ?R shoulder IR 2x10 7# ?Rt shoulder ER 2x10 3# ?Rt single arm row with ER 3x10 RTB ?Shelf taps 2x10 4# R shoulder flexion ?Body blade flex/ext pronated grip x 30" ?Body blade IR/ER x 30" ?Seated low rows to neutral  shoulder extension with 25# cable 2x15 ?Seated lat pulldown behind head x 10 20# - difficult  ?Wall slide R shoulder flex/abd 2x10 each ?Pulleys R shoulder abd x 1 min ? ?[facility] Adult PT Treatment: DATE: 12/03/2021 ?Therapeutic Exercise: ?UBE lvl 2.0 x 4 min (fwd/bwd) while taking subjective ?Row 3x12 20# ?Shoulder extension 3x10 20# ?Rt shoulder ER 3x10 3# ?R shoulder IR 3x10 7# ?Rt single arm row with ER 3x10 RTB ?  Supine serratus raise 2x10 YTB ?S/L ER 2x15 3# - R shoulder ?Shelf taps 3x10 3# R shoulder flexion ?Body blade flex/ext pronated grip x 30" ?Body blade IR/ER x 30" ?Seated low rows to neutral shoulder extension with 25# cable 2x12 ?Seated lat pulldown 2x10 25# ?Wall slide R shoulder flex/abd x 10 each ?Pulleys R shoulder abd x 2 min ? ?[facility] Adult PT Treatment: DATE: 11/27/2021 ?Therapeutic Exercise: ?UBE lvl 2.0 x 4 min (fwd/bwd) while taking subjective ?Row 3x12 20# ?Shoulder extension 3x10 20# ?Rt shoulder ER 3x10 3# ?R shoulder IR 3x10 7# ?Rt single arm row with ER 2x10 RTB ?Seated bilat ER w/ scap retraction 2x15 RTB ?Supine D2 flex R 2x15 ?Supine serratus raise 2x10 RTB ?Pulley R shoulder IR x 60" ?S/L ER 2x15 3# - R shoulder ?Shelf taps 3x10 3# R shoulder flexion ?Body blade flex/ext pronated grip x 30" ?Body blade IR/ER x 30" ?Seated low rows to neutral shoulder extension with 25# cable 2x10 ? ?PATIENT EDUCATION: ?Education details: Updated HEP, educated on progression into next phase of post-op protocol ?Person educated: Patient ?Education method: Explanation ?Education comprehension: verbalized understanding  ?  ?  ?HOME EXERCISE PROGRAM: ?Access Code: [email or web address] ?URL: [email or web address] ?Date: 10/05/2021 ?Prepared by: Vanessa Vandalia ? ?Exercises ?Seated Scapular Retraction - 2 x daily - 7 x weekly - 3 sets - 10 reps - 3 hold ?Seated elbow extension AROM stretch OVER KNEE - 2 x daily - 7 x weekly - 2 sets - 10 reps ?Seated Finger Composite Flexion with Putty - 2 x daily - 7 x weekly  - 3 sets - 20 reps ?Seated Shoulder Pendulum Exercise - 2 x daily - 7 x weekly - 3 sets - 20 reps ?Supine Shoulder Flexion AAROM - 2 x daily - 7 x weekly - 3 sets - 10 reps - 5 sec hold ? ?Added 10/05/2021:

## 2021-12-17 ENCOUNTER — Ambulatory Visit: Payer: Medicare HMO

## 2021-12-17 DIAGNOSIS — M25511 Pain in right shoulder: Secondary | ICD-10-CM | POA: Diagnosis not present

## 2021-12-17 DIAGNOSIS — M6281 Muscle weakness (generalized): Secondary | ICD-10-CM

## 2021-12-17 DIAGNOSIS — M25611 Stiffness of right shoulder, not elsewhere classified: Secondary | ICD-10-CM | POA: Diagnosis not present

## 2021-12-17 DIAGNOSIS — G8929 Other chronic pain: Secondary | ICD-10-CM

## 2021-12-17 NOTE — Therapy (Addendum)
OUTPATIENT PHYSICAL THERAPY TREATMENT NOTE/ DISCHARGE SUMMARY    Patient Name: Christopher Wall, DDS MRN: [id number] DOB:Jan 23, 1946, 76 y.o., male Today's Date: 12/18/2021  PCP: McDiarmid, Blane Ohara, MD REFERRING PROVIDER: McDiarmid, Blane Ohara, MD   PT End of Session - 12/17/21 1615     Visit Number 19    Number of Visits 20    Date for PT Re-Evaluation 12/31/21    Authorization Type Aetna MCR    Authorization Time Period FOTO v6, v10    Progress Note Due on Visit 20    PT Start Time 1615    PT Stop Time 1654    PT Time Calculation (min) 39 min    Activity Tolerance Patient tolerated treatment well    Behavior During Therapy [facility] for tasks assessed/performed                             Past Medical History:  Diagnosis Date   Abdominal wall hernia 03/20/2018   ATTENTION DEFICIT, W/O HYPERACTIVITY 10/30/2006   Qualifier: History of  By: McDiarmid MD, Todd     Cataract    bilateral cataracts with lens implant   Chest pain, atypical 12/05/2017   COLON POLYP 02/07/2014   Tubular Adenoma x 1 - Dr Ardis Hughs (GI)   Diverticulosis of colon 07/09/2018   Based on colonoscopy 06/2108   Encounter for neuropsychological testing 06/26/2015   [facility]: Only B/L avg in rote memory auditory info encoding. No mood dysfnc   Encounter for neuropsychological testing 2008   Vivien Rossetti (Neuropsy): Mixed anxiety and depression o/w normal.    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   Repeat testing 06/2015 at [facility]: Only finding was below average in rote memory auditory info encoding. No mood dysfunction.    Overweight (BMI 25.0-29.9) 10/30/2006   Qualifier: History of  By: McDiarmid MD, Todd     Prediabetes 12/21/2013   Pulmonary nodule, left 06/09/2015   Pure hypercholesterolemia 10/30/2006   ACC calculation 10-yr risk of CV event of 9.5% with recommendation to consider Statin therapy of moderate-to-high potency (12/21/13)     Rotator cuff tear 12/08/2020   Shoulder injury, right, initial encounter 11/02/2020   Past Surgical History:  Procedure Laterality Date   CATARACT EXTRACTION W/ INTRAOCULAR LENS  IMPLANT, [place]   VASECTOMY     Patient Active Problem List   Diagnosis Date Noted   Abdominal wall hernia 03/20/2018   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 Pt has been compliant with HEP with no adverse effect. Pt is ready to begin PT at this time.  Pain: Are you having pain? No Numerical Pain Scale: 0/10 Pain Location: Rt shoulder Pain Frequency/Description: intermittent    OBJECTIVE:   *Unless otherwise noted, objective measures collected previously*  PATIENT SURVEYS:  FOTO 63% on 10/22/2021    UPPER EXTREMITY AROM/PROM:   A/PROM Right 09/08/2021 Right 09/21/2021 Right 09/24/2021 Right 09/28/2021 Right 10/05/2021 Right 10/22/2021 Right 11/07/2021  Right 11/12/2021  Shoulder flexion PROM: 85d, limited by dull pain at end range  PROM: 115, limited by pt reported tightness at end range PROM: 130, limited by pt reported tightness at end range PROM: 130,  limited by pt reported tightness at end range AROM: 145 AROM: 148 PROM: WNL 148 AROM  Shoulder extension            Shoulder abduction PROM: 75d, limited by dull pain at end range  PROM: 100d, limited by pt reported dull pain at end range PROM: 105d, limited by pt reported dull pain at end range PROM: 115d, limited by pt reported dull pain at end range AROM: 160 mild pain at end range AROM: 150 mild pain at end range PROM: 150 150 AROM  Shoulder adduction            Shoulder internal rotation PROM: 15d PROM: 45d  PROM: 50d  AROM: 60d AROM: 55d   T12  Shoulder external rotation PROM: 40d PROM: 50d  PROM: 70d  AROM: 65d AROM: 53d   C5  Elbow flexion AROM: 145          Elbow extension AROM: -15, limited by dull shoulder pain          (Blank rows = not tested)   UPPER EXTREMITY MMT:   MMT Right 09/08/2021 Left 09/08/2021 Right 10/05/2021  Shoulder flexion 2+/5 mild p!   4+/5 mild tightness  Shoulder extension       Shoulder abduction 2+/5   5/5 mild p!  Shoulder ER 2+/5   4/5 mildp!  Shoulder IR 2+/5   5/5  Shoulder adduction 2+/5     Middle trapezius 4/5     Lower trapezius       Elbow flexion 4/5, mild tenderness   5/5  Elbow extension 5/5     Grip strength (Lbs) 74 79   (Blank rows = not tested)                TODAY'S TREATMENT:  [facility] Adult PT Treatment: DATE: 12/17/2021 Therapeutic Exercise: UBE lvl 2.5 x 4 min (fwd/bwd) while taking subjective Row 3x10 27# Shoulder extension 2x10 27# Rt shoulder ER/IR walkout 7# x 5 each R shoulder IR 2x10 7# Rt shoulder ER 2x10 3# Rt single arm row with ER 3x10 RTB Farmer's carry 2x289f 15# KB R hand Ball roll with 3# weight on R forearm x 10 Shelf taps 2x10 3# R shoulder flexion Seated low rows to neutral shoulder extension with 35#  cable 3x10 Seated lat pulldown in front of head 2x10 15# - difficult  Wall slide R shoulder flex/abd x 10 each Pulleys R shoulder abd x 1 min  [facility] Adult PT Treatment: DATE: 12/10/2021 Therapeutic Exercise: UBE lvl 2.0 x 4 min (fwd/bwd) while taking subjective Row 3x12 23# Shoulder extension 3x10 23# Rt shoulder ER/IR walkout 7# x 5 each R shoulder IR 2x10 7# Rt shoulder ER 2x10 3# Rt single  arm row with ER 3x10 RTB Shelf taps 2x10 4# R shoulder flexion Body blade flex/ext pronated grip x 30" Body blade IR/ER x 30" Seated low rows to neutral shoulder extension with 25# cable 2x15 Seated lat pulldown behind head x 10 20# - difficult  Wall slide R shoulder flex/abd 2x10 each Pulleys R shoulder abd x 1 min  [facility] Adult PT Treatment: DATE: 12/03/2021 Therapeutic Exercise: UBE lvl 2.0 x 4 min (fwd/bwd) while taking subjective Row 3x12 20# Shoulder extension 3x10 20# Rt shoulder ER 3x10 3# R shoulder IR 3x10 7# Rt single arm row with ER 3x10 RTB Supine serratus raise 2x10 YTB S/L ER 2x15 3# - R shoulder Shelf taps 3x10 3# R shoulder flexion Body blade flex/ext pronated grip x 30" Body blade IR/ER x 30" Seated low rows to neutral shoulder extension with 25# cable 2x12 Seated lat pulldown 2x10 25# Wall slide R shoulder flex/abd x 10 each Pulleys R shoulder abd x 2 min  [facility] Adult PT Treatment: DATE: 11/27/2021 Therapeutic Exercise: UBE lvl 2.0 x 4 min (fwd/bwd) while taking subjective Row 3x12 20# Shoulder extension 3x10 20# Rt shoulder ER 3x10 3# R shoulder IR 3x10 7# Rt single arm row with ER 2x10 RTB Seated bilat ER w/ scap retraction 2x15 RTB Supine D2 flex R 2x15 Supine serratus raise 2x10 RTB Pulley R shoulder IR x 60" S/L ER 2x15 3# - R shoulder Shelf taps 3x10 3# R shoulder flexion Body blade flex/ext pronated grip x 30" Body blade IR/ER x 30" Seated low rows to neutral shoulder extension with 25# cable 2x10  PATIENT EDUCATION: Education details: Updated HEP,  educated on progression into next phase of post-op protocol Person educated: Patient Education method: Explanation Education comprehension: verbalized understanding      HOME EXERCISE PROGRAM: Access Code: [email or web address] URL: [email or web address] Date: 10/05/2021 Prepared by: Vanessa Diggins  Exercises Seated Scapular Retraction - 2 x daily - 7 x weekly - 3 sets - 10 reps - 3 hold Seated elbow extension AROM stretch OVER KNEE - 2 x daily - 7 x weekly - 2 sets - 10 reps Seated Finger Composite Flexion with Putty - 2 x daily - 7 x weekly - 3 sets - 20 reps Seated Shoulder Pendulum Exercise - 2 x daily - 7 x weekly - 3 sets - 20 reps Supine Shoulder Flexion AAROM - 2 x daily - 7 x weekly - 3 sets - 10 reps - 5 sec hold  Added 10/05/2021: Isometric Shoulder Flexion at Wall - 1 x daily - 7 x weekly - 2 sets - 10 reps - 5-sec hold Standing Isometric Shoulder Internal Rotation at Doorway - 1 x daily - 7 x weekly - 2 sets - 10 reps - 5-sec hold Isometric Shoulder External Rotation at Wall - 1 x daily - 7 x weekly - 2 sets - 10 reps - 5-sec hold Isometric Shoulder Abduction at Wall - 1 x daily - 7 x weekly - 2 sets - 10 reps - 5-sec hold Shoulder PNF D2 Flexion - 1 x daily - 7 x weekly - 3 sets - 10 reps    ASSESSMENT: Pt was able to complete all prescribed exercises with no adverse effect. Therapy continued to work on improving RTC and periscapular strength. He continues to progress very well with therapy and will be close to discharge soon after f/u with MD. Will continue per POC.     GOALS: Goals reviewed with patient? Yes   SHORT TERM GOALS:   STG  Name Target Date Goal status  1 Pt will report understanding and adherence to his HEP in order to promote independence in the management of his primary impairments. Baseline: Pt reports adherence to his HEP 10/06/2021 ACHIEVED  2 Pt will achieve >120 degrees shoulder elevation PROM in order to promote WNL AROM when reaching that  point in his rehab protocol Baseline: progressed from 85 degrees to 115 degrees on 09/21/2021 Final: Approx 130 degrees on 09/24/21 10/06/2021 ACHIEVED    LONG TERM GOALS:    LTG Name Target Date Goal status  1 Pt will achieve >160 degrees shoulder elevation AROM  in order to get dressed without limitation. 10/05/2021: 145d 10/22/2021: 148 deg 12/31/2021 ONGOING  2 Pt will achieve a FOTO score of 68% in order to demonstrate improved functional ability as it relates to his shoulder impairments.  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 Baseline: See MMT Chart 10/05/2021: See MMT Chart 12/31/2021 ONGOING  4 Pt will demonstrate ability to lift 25 pounds with BIL UE in order to put a saddle on his horses. Baseline: 12/31/2021 ONGOING  5 Pt will achieve >50 degrees of Rt shoulder ER and IR AROM in order to put on his seatbelt with less limitation. Baseline: Achieved 09/28/2021 12/03/2021 ACHIEVED    PLAN: PT FREQUENCY: 2x/week   PT DURATION: 8 weeks   PLANNED INTERVENTIONS: Therapeutic exercises, Therapeutic activity, Neuro Muscular re-education, Patient/Family education, Joint mobilization, Spinal mobilization, Cryotherapy, Taping, Vasopneumatic device, and Manual therapy   PLAN FOR NEXT SESSION: Progress shoulder PROM/ scapular and elbow strengthening in accordance to his post-op protocol; add shoulder AAROM to HEP   Ward Chatters, PT 12/18/21 9:03 AM   PHYSICAL THERAPY DISCHARGE SUMMARY  Visits from Start of Care: 19  Current functional level related to goals / functional outcomes: Pt has met or partially met all of his rehab goals.   Remaining deficits: Pt continues to be limited in shoulder strength, ROM, and functional ability. Unable to further assess due to not arriving since last visit   Education /  Equipment: HEP   Patient agrees to discharge. Patient goals were partially met. Patient is being discharged due to not returning since the last visit.  Vanessa Jewell, PT, DPT 02/13/22 3:15 PM

## 2021-12-24 DIAGNOSIS — M25511 Pain in right shoulder: Secondary | ICD-10-CM | POA: Diagnosis not present

## 2022-01-14 ENCOUNTER — Other Ambulatory Visit: Payer: Self-pay | Admitting: Family Medicine

## 2022-01-14 DIAGNOSIS — N528 Other male erectile dysfunction: Secondary | ICD-10-CM

## 2022-02-05 ENCOUNTER — Encounter: Payer: Self-pay | Admitting: *Deleted

## 2022-02-20 ENCOUNTER — Encounter: Payer: Self-pay | Admitting: Family Medicine

## 2022-02-21 ENCOUNTER — Other Ambulatory Visit: Payer: Self-pay | Admitting: Family Medicine

## 2022-02-21 DIAGNOSIS — N401 Enlarged prostate with lower urinary tract symptoms: Secondary | ICD-10-CM

## 2022-02-21 MED ORDER — TADALAFIL 5 MG PO TABS: 5.0000 mg | ORAL_TABLET | Freq: Every day | ORAL | 0 refills | Status: DC | PRN

## 2022-03-05 ENCOUNTER — Other Ambulatory Visit: Payer: Self-pay | Admitting: Family Medicine

## 2022-03-05 DIAGNOSIS — N138 Other obstructive and reflux uropathy: Secondary | ICD-10-CM

## 2022-03-06 ENCOUNTER — Other Ambulatory Visit: Payer: Self-pay | Admitting: Family Medicine

## 2022-03-06 DIAGNOSIS — Z77018 Contact with and (suspected) exposure to other hazardous metals: Secondary | ICD-10-CM

## 2022-11-04 DIAGNOSIS — R079 Chest pain, unspecified: Secondary | ICD-10-CM | POA: Diagnosis not present

## 2022-12-16 ENCOUNTER — Encounter: Payer: Self-pay | Admitting: *Deleted

## 2022-12-26 ENCOUNTER — Ambulatory Visit (INDEPENDENT_AMBULATORY_CARE_PROVIDER_SITE_OTHER): Payer: Medicare HMO | Admitting: Family Medicine

## 2022-12-26 ENCOUNTER — Encounter: Payer: Self-pay | Admitting: Family Medicine

## 2022-12-26 ENCOUNTER — Encounter: Payer: Medicare HMO | Admitting: Family Medicine

## 2022-12-26 VITALS — BP 147/74 | HR 66 | Ht 68.0 in | Wt 175.2 lb

## 2022-12-26 DIAGNOSIS — N401 Enlarged prostate with lower urinary tract symptoms: Secondary | ICD-10-CM

## 2022-12-26 DIAGNOSIS — I10 Essential (primary) hypertension: Secondary | ICD-10-CM

## 2022-12-26 DIAGNOSIS — R260 Ataxic gait: Secondary | ICD-10-CM

## 2022-12-26 DIAGNOSIS — E7800 Pure hypercholesterolemia, unspecified: Secondary | ICD-10-CM | POA: Diagnosis not present

## 2022-12-26 DIAGNOSIS — R053 Chronic cough: Secondary | ICD-10-CM | POA: Diagnosis not present

## 2022-12-26 DIAGNOSIS — Z125 Encounter for screening for malignant neoplasm of prostate: Secondary | ICD-10-CM | POA: Diagnosis not present

## 2022-12-26 DIAGNOSIS — E66811 Obesity, class 1: Secondary | ICD-10-CM

## 2022-12-26 DIAGNOSIS — E669 Obesity, unspecified: Secondary | ICD-10-CM

## 2022-12-26 DIAGNOSIS — R972 Elevated prostate specific antigen [PSA]: Secondary | ICD-10-CM

## 2022-12-26 DIAGNOSIS — N138 Other obstructive and reflux uropathy: Secondary | ICD-10-CM | POA: Diagnosis not present

## 2022-12-26 DIAGNOSIS — Z79899 Other long term (current) drug therapy: Secondary | ICD-10-CM

## 2022-12-26 LAB — POCT GLYCOSYLATED HEMOGLOBIN (HGB A1C): HbA1c, POC (prediabetic range): 5.7 % (ref 5.7–6.4)

## 2022-12-26 MED ORDER — TADALAFIL 5 MG PO TABS
5.0000 mg | ORAL_TABLET | Freq: Every day | ORAL | 3 refills | Status: DC | PRN
Start: 2022-12-26 — End: 2023-03-07

## 2022-12-26 MED ORDER — LISINOPRIL-HYDROCHLOROTHIAZIDE 10-12.5 MG PO TABS
1.0000 | ORAL_TABLET | Freq: Every day | ORAL | 3 refills | Status: DC
Start: 2022-12-26 — End: 2023-08-11

## 2022-12-26 NOTE — Patient Instructions (Addendum)
Your blood pressure is slightly high, consider restarting your lisinopril- HCTZ 10-12.5 mg tablet one daily.  We are checking your lipids and renal function and electrolytes.   For your chronic cough, please go to [facility] site, like [address], for chest xray/   Essential Tremor A tremor is trembling or shaking that a \ person cannot control. Most tremors affect the hands or arms. Tremors can also affect the head, vocal cords, legs, and other parts of the body. Essential tremor is a tremor without a known cause. Usually, it occurs while a person is trying to perform an action. It tends to get worse gradually as a person ages. What are the causes? The cause of this condition is not known, but it often runs in families. What increases the risk? You are more likely to develop this condition if: You have a family member with essential tremor. You are 27 years of age or older. What are the signs or symptoms? The main sign of a tremor is a rhythmic shaking of certain parts of your body that is uncontrolled and unintentional. You may: Have difficulty eating with a spoon or fork. Have difficulty writing. Nod your head up and down or side to side. Have a quivering voice. The shaking may: Get worse over time. Come and go. Be more noticeable on one side of your body. Get worse due to stress, tiredness (fatigue), caffeine, and extreme heat or cold. How is this diagnosed? This condition may be diagnosed based on: Your symptoms and medical history. A physical exam. There is no single test to diagnose an essential tremor. However, your health care provider may order tests to rule out other causes of your condition. These may include: Blood and urine tests. Imaging studies of your brain, such as a CT scan or MRI. How is this treated? Treatment for essential tremor depends on the severity of the condition. Mild tremors may not need treatment if they do not affect your day-to-day  life. Severe tremors may need to be treated using one or more of the following options: Medicines. Injections of a substance called botulinum toxin. Procedures such as deep brain stimulation (DBS) implantation or MRI-guided ultrasound treatment. Lifestyle changes. Occupational or physical therapy. Follow these instructions at home: Lifestyle  Do not use any products that contain nicotine or tobacco. These products include cigarettes, chewing tobacco, and vaping devices, such as e-cigarettes. If you need help quitting, ask your health care provider. Limit your caffeine intake as told by your health care provider. Try to get 8 hours of sleep each night. Find ways to manage your stress that fit your lifestyle and personality. Consider trying meditation or yoga. Try to anticipate stressful situations and allow extra time to manage them. If you are struggling emotionally with the effects of your tremor, consider working with a mental health provider. General instructions Take over-the-counter and prescription medicines only as told by your health care provider. Avoid extreme heat and extreme cold. Keep all follow-up visits. This is important. Visits may include physical therapy visits. Where to find more information [employer] [facility] [employer] [facility] Stroke: [email or web address] Contact a health care provider if: You experience any changes in the location or intensity of your tremors. You start having a tremor after starting a new medicine. You have a tremor with other symptoms, such as: Numbness. Tingling. Pain. Weakness. Your tremor gets worse. Your tremor interferes with your daily life. You feel down, blue, or sad for at least 2 weeks in a  row. Worrying about your tremor and what other people think about you interferes with your everyday life functions, including relationships, work, or school. Summary Essential tremor is a tremor without a known cause. Usually, it occurs  when you are trying to perform an action. You are more likely to develop this condition if you have a family member with essential tremor. The main sign of a tremor is a rhythmic shaking of certain parts of your body that is uncontrolled and unintentional. Treatment for essential tremor depends on the severity of the condition. This information is not intended to replace advice given to you by your health care provider. Make sure you discuss any questions you have with your health care provider. Document Revised: 06/08/2021 Document Reviewed: 06/08/2021 Elsevier Patient Education  2023 [employer]     SHINGLES VACCINATION  Chickenpox and shingles are related because they are caused by the same virus (varicella-zoster virus). After a person recovers from chickenpox, the virus stays dormant (inactive) in the body. It can reactivate years later and cause shingles.  CDC recommends that adults 50 years and older get two doses of the shingles vaccine called Shingrix (recombinant zoster vaccine) to prevent shingles and the complications from the disease.  Shingrix vaccination provides strong protection against shingles. In adults 50 years Shingrix is more than 90% effective at preventing shingles.  Studies show that Shingrix is safe. The vaccine helps your body create a strong defense against shingles. As a result, you are likely to have temporary side effects from getting the shots. Some people feel they are having a mild flu without the head cold symptoms.  The side effects might affect your ability to do normal daily activities for 2 to 3 days.  Getting the shot when you have a couple days without commitments is a good idea, in case you do feel under the weather.  Medicare prescription drug plans (Part D) usually cover all available vaccines needed to prevent illness, like the shingles (Shingrix) shot. Contact your Medicare drug plan If you are uncertain if they will cover the shingles  vaccination.   Your pharmacist can give you Shingrix as a shot in your upper arm. You will need two Shingrix vaccinations to complete your inoculation against shingles.  The second injection is given at least 2 months after the first Shingrix vaccination was given.

## 2022-12-27 ENCOUNTER — Encounter: Payer: Self-pay | Admitting: Family Medicine

## 2022-12-27 ENCOUNTER — Telehealth: Payer: Self-pay | Admitting: Family Medicine

## 2022-12-27 DIAGNOSIS — R260 Ataxic gait: Secondary | ICD-10-CM | POA: Insufficient documentation

## 2022-12-27 DIAGNOSIS — N401 Enlarged prostate with lower urinary tract symptoms: Secondary | ICD-10-CM | POA: Insufficient documentation

## 2022-12-27 DIAGNOSIS — R972 Elevated prostate specific antigen [PSA]: Secondary | ICD-10-CM | POA: Insufficient documentation

## 2022-12-27 LAB — BASIC METABOLIC PANEL
BUN/Creatinine Ratio: 30 — ABNORMAL HIGH (ref 10–24)
BUN: 31 mg/dL — ABNORMAL HIGH (ref 8–27)
CO2: 25 mmol/L (ref 20–29)
Calcium: 9.4 mg/dL (ref 8.6–10.2)
Chloride: 100 mmol/L (ref 96–106)
Creatinine, Ser: 1.05 mg/dL (ref 0.76–1.27)
Glucose: 87 mg/dL (ref 70–99)
Potassium: 4.9 mmol/L (ref 3.5–5.2)
Sodium: 139 mmol/L (ref 134–144)
eGFR: 74 mL/min/{1.73_m2} (ref >59)

## 2022-12-27 LAB — PSA: Prostate Specific Ag, Serum: 167 ng/mL — ABNORMAL HIGH (ref 0.0–4.0)

## 2022-12-27 LAB — LIPID PANEL
Chol/HDL Ratio: 3.1 ratio (ref 0.0–5.0)
Cholesterol, Total: 208 mg/dL — ABNORMAL HIGH (ref 100–199)
HDL: 67 mg/dL (ref >39)
LDL Chol Calc (NIH): 111 mg/dL — ABNORMAL HIGH (ref 0–99)
Triglycerides: 172 mg/dL — ABNORMAL HIGH (ref 0–149)
VLDL Cholesterol Cal: 30 mg/dL (ref 5–40)

## 2022-12-27 NOTE — Progress Notes (Signed)
Christopher Wall, DDS is alone Sources of clinical information for visit is/are patient. Nursing assessment for this office visit was reviewed with the patient for accuracy and revision.     Previous Report(s) Reviewed: none     11/29/2021   10:02 AM  Depression screen PHQ 2/9  Decreased Interest 0  Down, Depressed, Hopeless 0  PHQ - 2 Score 0  Altered sleeping 1  Tired, decreased energy 2  Change in appetite 0  Feeling bad or failure about yourself  0  Trouble concentrating 0  Moving slowly or fidgety/restless 1  Suicidal thoughts 0  PHQ-9 Score 4  Difficult doing work/chores Not difficult at all   Flowsheet Row Office Visit from 11/29/2021 in [facility]  Thoughts that you would be better off dead, or of hurting yourself in some way Not at all  PHQ-9 Total Score 4          12/26/2022    2:29 PM 11/29/2021   10:02 AM 11/02/2020   11:11 AM 04/02/2018    8:52 AM 03/20/2018    9:39 AM  Fall Risk   Falls in the past year? 0 0 0 No No  Number falls in past yr: 0 0 0    Injury with Fall? 0 0 0         11/29/2021   10:02 AM 04/02/2018    8:52 AM 03/20/2018    9:39 AM  PHQ9 SCORE ONLY  PHQ-9 Total Score 4 0 0    There are no preventive care reminders to display for this patient.  Health Maintenance Due  Topic Date Due   Medicare Annual Wellness (AWV)  Never done   Zoster Vaccines- Shingrix (2 of 2) 06/09/2018      History/P.E. limitations: none  There are no preventive care reminders to display for this patient. There are no preventive care reminders to display for this patient.  Health Maintenance Due  Topic Date Due   Medicare Annual Wellness (AWV)  Never done   Zoster Vaccines- Shingrix (2 of 2) 06/09/2018     Chief Complaint  Patient presents with   Hypertension     --------------------------------------------------------------------------------------------------------------------------------------------- Visit Problem List with  A/P  No problem-specific Assessment & Plan notes found for this encounter.

## 2022-12-27 NOTE — Assessment & Plan Note (Addendum)
Established problem Uncontrolled.  Patient is not at goal of <130/80. Basic Metabolic Panel:    Component Value Date/Time   NA 139 12/26/2022 1740   K 4.9 12/26/2022 1740   CL 100 12/26/2022 1740   CO2 25 12/26/2022 1740   BUN 31 (H) 12/26/2022 1740   CREATININE 1.05 12/26/2022 1740   CREATININE 0.99 06/08/2015 1229   GLUCOSE 87 12/26/2022 1740   GLUCOSE 80 06/08/2015 1229   CALCIUM 9.4 12/26/2022 1740   Stable SCr Recommend restarting Lisinopril-HCTZ 10/12.5 tab, one daily.

## 2022-12-27 NOTE — Telephone Encounter (Signed)
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 He agreed to a referral over to [facility] for further evaluation and treatment. A referral was placed to [facility]

## 2022-12-27 NOTE — Assessment & Plan Note (Signed)
Persistent minimally productive cough Checking CXR

## 2022-12-27 NOTE — Assessment & Plan Note (Signed)
Established problem Lipid Panel     Component Value Date/Time   CHOL 208 (H) 12/26/2022 1740   TRIG 172 (H) 12/26/2022 1740   HDL 67 12/26/2022 1740   CHOLHDL 3.1 12/26/2022 1740   CHOLHDL 4.8 06/08/2015 1229   VLDL 38 (H) 06/08/2015 1229   LDLCALC 111 (H) 12/26/2022 1740   LDLDIRECT 146 (H) 11/02/2020 1207   LABVLDL 30 12/26/2022 1740    Improved LDL from 150 last year  Likely related to lifestyle changes and weight loss.  Continue lifestyle management of hypercholesterolemia

## 2022-12-27 NOTE — Assessment & Plan Note (Signed)
New PSA 167 Last PSA 2.14  I discussed need for Urology consultation with Urology Dr Ladona Ridgel agreed.  Referral placed to [facility].

## 2022-12-27 NOTE — Assessment & Plan Note (Signed)
Established problem. Stable. Patient is at goal. No signs of complications, medication side effects, or red flags. Continue Cialis 5 mg daily

## 2022-12-27 NOTE — Assessment & Plan Note (Signed)
New Wife has note for a while a "bobbing" of patient's head when he is relaxed. He does not notice it. 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 No hand tremor or vocal tremors noted.  No FH of tremors  Tremor PE No tremor with rest, posture, or intention on either side No bradykinesia at wrists No loss of speed or frequency with thumb - index tapping either side.  No head titubation, no vocal tremor.  A/ No signs of tremor on exam Possible Essential Tremor of Head, but if present, it is not causing any interference with advanced activities.  P/ Monitor for progression Patient ed material about nature of an essential tremor.

## 2023-01-06 ENCOUNTER — Encounter: Payer: Self-pay | Admitting: Family Medicine

## 2023-01-06 ENCOUNTER — Ambulatory Visit
Admission: RE | Admit: 2023-01-06 | Discharge: 2023-01-06 | Disposition: A | Discharge status: Home / Self Care | Payer: Medicare HMO | Source: Ambulatory Visit | Attending: Family Medicine | Admitting: Family Medicine

## 2023-01-06 DIAGNOSIS — R053 Chronic cough: Secondary | ICD-10-CM

## 2023-01-08 ENCOUNTER — Encounter: Payer: Self-pay | Admitting: Family Medicine

## 2023-01-14 ENCOUNTER — Other Ambulatory Visit: Payer: Self-pay | Admitting: Family Medicine

## 2023-01-14 DIAGNOSIS — I10 Essential (primary) hypertension: Secondary | ICD-10-CM

## 2023-01-14 DIAGNOSIS — I771 Stricture of artery: Secondary | ICD-10-CM

## 2023-01-14 NOTE — Progress Notes (Signed)
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  We discussed the difficulty in distinguishing tortuous aortas from aortic aneurysms. Distiguishing them is best with CTA Chest. Given Dr Alexios's preexisting hypertension, it seems prudent to proceed with a CTA.  Dr Loistine Chance agreed to proceed with the CTA Chest for further evaluation of the thoracic aorta's anatomy. His recent serum creatinine was 1.0  Order placed with indication of tortuous aorta.   Dr Loistine Chance has his initial urology consultation in a couple weeks.

## 2023-02-11 DIAGNOSIS — R972 Elevated prostate specific antigen [PSA]: Secondary | ICD-10-CM | POA: Diagnosis not present

## 2023-02-11 DIAGNOSIS — N401 Enlarged prostate with lower urinary tract symptoms: Secondary | ICD-10-CM | POA: Diagnosis not present

## 2023-02-11 DIAGNOSIS — R350 Frequency of micturition: Secondary | ICD-10-CM | POA: Diagnosis not present

## 2023-02-17 DIAGNOSIS — R972 Elevated prostate specific antigen [PSA]: Secondary | ICD-10-CM | POA: Diagnosis not present

## 2023-02-21 ENCOUNTER — Telehealth: Payer: Self-pay

## 2023-02-21 ENCOUNTER — Other Ambulatory Visit (HOSPITAL_COMMUNITY): Payer: Self-pay | Admitting: Urology

## 2023-02-21 ENCOUNTER — Ambulatory Visit
Admission: RE | Admit: 2023-02-21 | Discharge: 2023-02-21 | Disposition: A | Discharge status: Home / Self Care | Payer: Medicare HMO | Source: Ambulatory Visit | Attending: Family Medicine | Admitting: Family Medicine

## 2023-02-21 DIAGNOSIS — R972 Elevated prostate specific antigen [PSA]: Secondary | ICD-10-CM

## 2023-02-21 DIAGNOSIS — R590 Localized enlarged lymph nodes: Secondary | ICD-10-CM | POA: Diagnosis not present

## 2023-02-21 DIAGNOSIS — I10 Essential (primary) hypertension: Secondary | ICD-10-CM

## 2023-02-21 DIAGNOSIS — I77810 Thoracic aortic ectasia: Secondary | ICD-10-CM | POA: Diagnosis not present

## 2023-02-21 DIAGNOSIS — R918 Other nonspecific abnormal finding of lung field: Secondary | ICD-10-CM | POA: Diagnosis not present

## 2023-02-21 DIAGNOSIS — I771 Stricture of artery: Secondary | ICD-10-CM

## 2023-02-21 MED ORDER — IOPAMIDOL (ISOVUE-300) INJECTION 61%
81.0000 mL | Freq: Once | INTRAVENOUS | Status: AC | PRN
  Administered 2023-02-21: 81 mL via INTRAVENOUS

## 2023-02-21 NOTE — Telephone Encounter (Signed)
Radiology calls nurse line to give call report on CT Chest.   Report is visual in chart.   Will forward to MD.

## 2023-02-25 ENCOUNTER — Telehealth: Payer: Self-pay | Admitting: Family Medicine

## 2023-02-25 NOTE — Telephone Encounter (Signed)
CT Chest 02/21/23 discussed with Dr Ladona Ridgel.

## 2023-02-25 NOTE — Telephone Encounter (Signed)
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 We discussed the stability of the dilation and recommendation to follow annually. We talked about the evidence of coronary atherosclerosis.  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    I mentioned the right hilar lymph node enlargement.  He is going for a PSA PET scan mid-July.  I will message Dr Cardell Peach ([facility]) about the hilar lymph node on CT

## 2023-03-05 DIAGNOSIS — R350 Frequency of micturition: Secondary | ICD-10-CM | POA: Diagnosis not present

## 2023-03-05 DIAGNOSIS — C61 Malignant neoplasm of prostate: Secondary | ICD-10-CM | POA: Diagnosis not present

## 2023-03-05 DIAGNOSIS — N401 Enlarged prostate with lower urinary tract symptoms: Secondary | ICD-10-CM | POA: Diagnosis not present

## 2023-03-07 ENCOUNTER — Other Ambulatory Visit: Payer: Self-pay | Admitting: Family Medicine

## 2023-03-07 DIAGNOSIS — N138 Other obstructive and reflux uropathy: Secondary | ICD-10-CM

## 2023-03-10 ENCOUNTER — Encounter: Payer: Self-pay | Admitting: Family Medicine

## 2023-03-10 DIAGNOSIS — C61 Malignant neoplasm of prostate: Secondary | ICD-10-CM

## 2023-03-11 DIAGNOSIS — C61 Malignant neoplasm of prostate: Secondary | ICD-10-CM | POA: Insufficient documentation

## 2023-03-14 ENCOUNTER — Ambulatory Visit (HOSPITAL_COMMUNITY)
Admission: RE | Admit: 2023-03-14 | Discharge: 2023-03-14 | Disposition: A | Discharge status: Home / Self Care | Payer: Medicare HMO | Source: Ambulatory Visit | Attending: Urology | Admitting: Urology

## 2023-03-14 DIAGNOSIS — R972 Elevated prostate specific antigen [PSA]: Secondary | ICD-10-CM | POA: Insufficient documentation

## 2023-03-14 MED ORDER — PIFLIFOLASTAT F 18 (PYLARIFY) INJECTION
9.0000 | Freq: Once | INTRAVENOUS | Status: AC
  Administered 2023-03-14: 9.73 via INTRAVENOUS

## 2023-03-21 ENCOUNTER — Inpatient Hospital Stay: Payer: Medicare HMO | Attending: Oncology | Admitting: Oncology

## 2023-03-21 ENCOUNTER — Inpatient Hospital Stay: Payer: Medicare HMO

## 2023-03-21 ENCOUNTER — Encounter: Payer: Self-pay | Admitting: Oncology

## 2023-03-21 VITALS — BP 125/62 | HR 73 | Temp 97.1°F | Ht 68.0 in | Wt 173.5 lb

## 2023-03-21 DIAGNOSIS — Z5111 Encounter for antineoplastic chemotherapy: Secondary | ICD-10-CM | POA: Diagnosis not present

## 2023-03-21 DIAGNOSIS — C61 Malignant neoplasm of prostate: Secondary | ICD-10-CM

## 2023-03-21 DIAGNOSIS — C7951 Secondary malignant neoplasm of bone: Secondary | ICD-10-CM | POA: Diagnosis not present

## 2023-03-21 DIAGNOSIS — R350 Frequency of micturition: Secondary | ICD-10-CM | POA: Insufficient documentation

## 2023-03-21 DIAGNOSIS — Z7189 Other specified counseling: Secondary | ICD-10-CM | POA: Diagnosis not present

## 2023-03-21 LAB — CMP (CANCER CENTER ONLY)
ALT: 19 U/L (ref 0–44)
AST: 24 U/L (ref 15–41)
Albumin: 4.6 g/dL (ref 3.5–5.0)
Alkaline Phosphatase: 42 U/L (ref 38–126)
Anion gap: 8 (ref 5–15)
BUN: 20 mg/dL (ref 8–23)
CO2: 28 mmol/L (ref 22–32)
Calcium: 9.6 mg/dL (ref 8.9–10.3)
Chloride: 101 mmol/L (ref 98–111)
Creatinine: 1.01 mg/dL (ref 0.61–1.24)
GFR, Estimated: >60 mL/min (ref >60)
Glucose, Bld: 100 mg/dL — ABNORMAL HIGH (ref 70–99)
Potassium: 4.4 mmol/L (ref 3.5–5.1)
Sodium: 137 mmol/L (ref 135–145)
Total Bilirubin: 0.8 mg/dL (ref 0.3–1.2)
Total Protein: 7.7 g/dL (ref 6.5–8.1)

## 2023-03-21 LAB — CBC (CANCER CENTER ONLY)
HCT: 45.6 % (ref 39.0–52.0)
Hemoglobin: 15 g/dL (ref 13.0–17.0)
MCH: 27.1 pg (ref 26.0–34.0)
MCHC: 32.9 g/dL (ref 30.0–36.0)
MCV: 82.5 fL (ref 80.0–100.0)
Platelet Count: 214 10*3/uL (ref 150–400)
RBC: 5.53 MIL/uL (ref 4.22–5.81)
RDW: 14.2 % (ref 11.5–15.5)
WBC Count: 7.9 10*3/uL (ref 4.0–10.5)
nRBC: 0 % (ref 0.0–0.2)

## 2023-03-21 LAB — PSA: Prostatic Specific Antigen: 329.81 ng/mL — ABNORMAL HIGH (ref 0.00–4.00)

## 2023-03-21 NOTE — Assessment & Plan Note (Signed)
Potentially secondary to enlarged prostate. He is interested in establishing care with a local urologist.  Refer patient to [facility]

## 2023-03-21 NOTE — Progress Notes (Addendum)
Hematology/Oncology Consult note [phone] [phone]        REFERRING PROVIDER: McDiarmid, Leighton Roach, MD   CHIEF COMPLAINTS/REASON FOR VISIT:  Evaluation of prostate cancer   ASSESSMENT & PLAN:   Cancer Staging  Prostate cancer [facility]) Staging form: Prostate, AJCC 8th Edition - Clinical stage from 03/21/2023: Stage IVA (cT3b, cN1, cM0, PSA: 167) - Signed by Rickard Patience, MD on 03/21/2023   Prostate cancer [facility]) I will obtain his prostate biopsy results from urology office. CT/PET scan results were reviewed with patient.  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  Castration sensitive. Recommend to repeat another PSA level today. Check molecular testing NGS Treatment options was reviewed and discussed with patient Recommend antigen deprivation therapy-with Firmagon monthly ASAP.  Consider switch to Eligard every 6 months in the future. Discussed with patient about option of docetaxel every 3 weeks for 6 cycles versus ARPI, concurrent options. He is open to chemotherapy and has good performance status. I explained to the patient the risks and benefits of chemotherapy including all but not limited to infusion reaction, hair loss, hearing loss, mouth sore, nausea, vomiting, low blood counts, bleeding, heart failure, kidney failure, neuropathy and risk of life threatening infection and even death, secondary malignancy etc.   Patient voices understanding and willing to proceed chemotherapy.  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  He currently continues to work 3 days/week.  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  We discussed the option of started on androgen deprivation therapy ASAP.  Consider starting chemotherapy in 2 to 4 weeks. Alternative is to start on ARPI bridging to chemotherapy.     #  Chemotherapy education; Medi port placement option was reviewed and discussed with patient. Antiemetics-Zofran and Compazine; EMLA cream sent to pharmacy Supportive care measures are necessary for patient well-being and will be provided as necessary. We spent sufficient time to discuss many aspect of care, questions were answered to patient's satisfaction.  He is considering selling his practice and retired.  Goals of care, counseling/discussion Discussed with patient.  He understands that his condition is not curable.  Treatment is with palliative intent.  Urinary frequency Potentially secondary to enlarged prostate. He is interested in establishing care with a local urologist.  Refer patient to [facility] urology   Orders Placed This Encounter  Procedures   CBC (Cancer Center Only)    Standing Status:   Future    Number of Occurrences:   1    Standing Expiration Date:   03/20/2024   CMP (Cancer Center only)    Standing Status:   Future    Number of Occurrences:   1    Standing Expiration Date:   03/20/2024   PSA    Standing Status:   Future    Number of Occurrences:   1    Standing Expiration Date:   03/20/2024   Ambulatory referral to Urology    Referral Priority:   Routine    Referral Type:   Consultation    Referral Reason:   Specialty Services Required    Requested Specialty:   Urology    Number of Visits Requested:   1   Follow-up -TBD.  Awaiting patient's decision of the timeframe to start chemotherapy. All questions were answered. The patient knows to call the clinic with any problems, questions or concerns.  Rickard Patience,  MD, PhD [facility] Hematology Oncology 03/21/2023   HISTORY OF PRESENTING ILLNESS:   Christopher Wall, Christopher Wall is a  77 y.o.  male with PMH listed below was seen in consultation at the request of  McDiarmid, Leighton Roach, MD  for evaluation of prostate cancer.  Oncology History  Prostate cancer (HCC)  02/21/2023 Imaging   CT angiogram chest aorta with  contrast showed 1. No acute intrathoracic abnormality. 2. 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 Continue annual imaging followup by CTA or MRA. This recommendation follows 2010 ACCF/AHA/AATS/ACR/ASA/SCA/SCAI/SIR/STS/SVM Guidelines for the Diagnosis and Management of Patients with Thoracic Aortic Disease. Circulation. 2010; 121: Z308-M578. Aortic aneurysm NOS (ICD10-I71.9) 3. Severe burden multivessel coronary atherosclerosis, greatest within the LAD. 4. Enlarged RIGHT hilar lymph nodes, suspicious. RIGHT posterior apical scarring without a discrete nodule. No suspicious mass within the RIGHT lung.   03/11/2023 Initial Diagnosis   Prostate cancer [facility])  April 2024, patient's PSA was elevated 167. Patient saw urologist.  Status post prostate biopsy.  Results were not available in epic.  Per patient he was diagnosed with prostate cancer.   03/14/2023 Imaging   PSMA PET scan showed 1. Intense radiotracer activity involving the near entirety of the RIGHT lobe of the prostate gland with extension to the LEFT lobe consistent with primary prostate adenocarcinoma. 2. Intense radiotracer activity extends into the RIGHT seminal vesicle consistent with nodal metastasis. 3. Intense radiotracer avid lymph nodes in the pelvis consistent with nodal metastasis. 4. Intense radiotracer avid metastatic adenopathy in the RIGHT lower paratracheal space consistent with mediastinal nodal metastasis. 5. Intense radiotracer avid metastatic disease to the skeleton with involvement of the thoracic spine, iliac bones, and RIGHT acromion process. 6. Nodular implant beneath the RIGHT diaphragm at the level of the upper pole of the RIGHT kidney consistent with metastatic implant.   03/21/2023 Cancer Staging   Staging form: Prostate, AJCC 8th Edition - Clinical stage from 03/21/2023: Stage IVA (cT3b, cN1, cM0, PSA: 167) - Signed by Rickard Patience, MD on 03/21/2023 Stage prefix: Initial  diagnosis Prostate specific antigen (PSA) range: 20 or greater     Patient presents to discuss diagnosis and management plan. He is a Education officer, community, planning to retire.  He is accompanied by his wife. He has experienced some urinary frequency, hesitancy.  He attributes his symptoms to BPH.  MEDICAL HISTORY:  Past Medical History:  Diagnosis Date   Abdominal wall hernia 03/20/2018   ATTENTION DEFICIT, W/O HYPERACTIVITY 10/30/2006   Qualifier: History of  By: McDiarmid MD, Tawanna Cooler     Cataract    bilateral cataracts with lens implant   Chest pain, atypical 12/05/2017   COLON POLYP 02/07/2014   Tubular Adenoma x 1 - Dr Christella Hartigan (GI)   Diverticulosis of colon 07/09/2018   Based on colonoscopy 06/2108   Encounter for neuropsychological testing 06/26/2015   [facility]: Only B/L avg in rote memory auditory info encoding. No mood dysfnc   Encounter for neuropsychological testing 2008   [facility] Grates (Neuropsy): Mixed anxiety and depression o/w normal.    Family history of esophageal cancer 12/26/2017   GASTROESOPHAGEAL REFLUX, NO ESOPHAGITIS 10/30/2006   Qualifier: History of  By: McDiarmid MD, Todd     Hearing loss of both ears 12/21/2013   HYPERLIPIDEMIA 10/30/2006   Qualifier: Diagnosis of  By: Haydee Salter     Hypertension 04/24/2009   Qualifier: Diagnosis of  By: McDiarmid MD, Todd     Left flank pain 06/15/2018   Memory difficulties 06/09/2015  Prior neuropsychology testing in 2008 by Dr Leonides Cave (Neuropsychologist) was unremarkable other than mixed anxiety/depression.  Repeat testing 06/2015 at [facility]: Only finding was below average in rote memory auditory info encoding. No mood dysfunction.    Obesity (BMI 30.0-34.9) 10/30/2006   Qualifier: History of   By: McDiarmid MD, Todd       Overweight (BMI 25.0-29.9) 10/30/2006   Qualifier: History of  By: McDiarmid MD, Todd     Prediabetes 12/21/2013   Pulmonary nodule, left 06/09/2015   Pure  hypercholesterolemia 10/30/2006   ACC calculation 10-yr risk of CV event of 9.5% with recommendation to consider Statin therapy of moderate-to-high potency (12/21/13)     Rotator cuff tear 12/08/2020   Shoulder injury, right, initial encounter 11/02/2020    SURGICAL HISTORY: Past Surgical History:  Procedure Laterality Date   CATARACT EXTRACTION W/ INTRAOCULAR LENS  IMPLANT, BILATERAL     COCCYGECTOMY  1985   VASECTOMY      SOCIAL HISTORY: Social History   Socioeconomic History   Marital status: Married    Spouse name: Darl Pikes   Number of children: 4   Years of education: >20   Highest education level: Not on file  Occupational History   Occupation: Education officer, community   Occupation: Pharmacist  Tobacco Use   Smoking status: Never   Smokeless tobacco: Never  Vaping Use   Vaping status: Never Used  Substance and Sexual Activity   Alcohol use: Yes    Alcohol/week: 3.0 standard drinks of alcohol    Types: 3 drink(s) per week   Drug use: No   Sexual activity: Yes    Comment: monogamous  Other Topics Concern   Not on file  Social History Narrative   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   4 adult sons ([place], [place], [place]): 3 biological and 1 step-child   Patient is third of six children   No formal HCPOA executed (12/21/13)   No Advanced Directive executed (12/21/13)   He and wife are raising a granddaughter (2016)   Hobbies: Motorcycling and Horse riding.         Social Determinants of Health   Financial Resource Strain: Not on file  Food Insecurity: No Food Insecurity (03/21/2023)   Hunger Vital Sign    Worried About Running Out of Food in the Last Year: Never true    Ran Out of Food in the Last Year: Never true  Transportation Needs: No Transportation Needs (03/21/2023)   PRAPARE - Administrator, Civil Service (Medical): No    Lack of Transportation (Non-Medical): No  Physical Activity: Not on file  Stress: Not on file  Social Connections: Not on  file  Intimate Partner Violence: Not At Risk (03/21/2023)   Humiliation, Afraid, Rape, and Kick questionnaire    Fear of Current or Ex-Partner: No    Emotionally Abused: No    Physically Abused: No    Sexually Abused: No    FAMILY HISTORY: Family History  Problem Relation Age of Onset   Hypertension Mother    Stroke Mother 35   Dementia Mother 60   Diabetes Father    Diabetes Sister    Diabetes Brother    Esophageal cancer Brother    Lymphoma Brother    Esophageal cancer Brother    Lymphoma Brother    Diabetes Brother    Colon cancer Neg Hx     ALLERGIES:  is allergic to cephalosporins.  MEDICATIONS:  Current Outpatient Medications  Medication  Sig Dispense Refill   lisinopril-hydrochlorothiazide (ZESTORETIC) 10-12.5 MG tablet Take 1 tablet by mouth daily. 90 tablet 3   tadalafil (CIALIS) 5 MG tablet TAKE 1 TABLET BY MOUTH ONCE DAILY AS NEEDED 90 tablet 3   No current facility-administered medications for this visit.    Review of Systems  Constitutional:  Negative for appetite change, chills, fatigue, fever and unexpected weight change.  HENT:   Negative for hearing loss and voice change.   Eyes:  Negative for eye problems and icterus.  Respiratory:  Negative for chest tightness, cough and shortness of breath.   Cardiovascular:  Negative for chest pain and leg swelling.  Gastrointestinal:  Negative for abdominal distention and abdominal pain.  Endocrine: Negative for hot flashes.  Genitourinary:  Negative for difficulty urinating, dysuria and frequency.   Musculoskeletal:  Positive for arthralgias and back pain.  Skin:  Negative for itching and rash.  Neurological:  Negative for light-headedness and numbness.  Hematological:  Negative for adenopathy. Does not bruise/bleed easily.  Psychiatric/Behavioral:  Negative for confusion.    PHYSICAL EXAMINATION: ECOG PERFORMANCE STATUS: 0 - Asymptomatic Vitals:   03/21/23 1123  BP: 125/62  Pulse: 73  Temp: (!) 97.1 F  (36.2 C)  SpO2: 96%   Filed Weights   03/21/23 1123  Weight: 173 lb 8 oz (78.7 kg)    Physical Exam Constitutional:      General: He is not in acute distress. HENT:     Head: Normocephalic and atraumatic.  Eyes:     General: No scleral icterus. Cardiovascular:     Rate and Rhythm: Normal rate and regular rhythm.     Heart sounds: Normal heart sounds.  Pulmonary:     Effort: Pulmonary effort is normal. No respiratory distress.     Breath sounds: No wheezing.  Abdominal:     General: Bowel sounds are normal. There is no distension.     Palpations: Abdomen is soft.  Musculoskeletal:        General: No deformity. Normal range of motion.     Cervical back: Normal range of motion and neck supple.  Skin:    General: Skin is warm and dry.     Findings: No erythema or rash.  Neurological:     Mental Status: He is alert and oriented to person, place, and time. Mental status is at baseline.     Cranial Nerves: No cranial nerve deficit.     Coordination: Coordination normal.  Psychiatric:        Mood and Affect: Mood normal.     LABORATORY DATA:  I have reviewed the data as listed    Latest Ref Rng & Units 03/21/2023   12:17 PM 11/29/2021   11:08 AM 02/18/2014    9:53 AM  CBC  WBC 4.0 - 10.5 K/uL 7.9  7.5  7.6   Hemoglobin 13.0 - 17.0 g/dL 32.2  02.5  42.7   Hematocrit 39.0 - 52.0 % 45.6  44.4  41.5   Platelets 150 - 400 K/uL 214  222  163       Latest Ref Rng & Units 03/21/2023   12:17 PM 12/26/2022    5:40 PM 11/29/2021   11:08 AM  CMP  Glucose 70 - 99 mg/dL 062  87  376   BUN 8 - 23 mg/dL 20  31  20    Creatinine 0.61 - 1.24 mg/dL 2.83  1.51  7.61   Sodium 135 - 145 mmol/L 137  139  141  Potassium 3.5 - 5.1 mmol/L 4.4  4.9  4.3   Chloride 98 - 111 mmol/L 101  100  100   CO2 22 - 32 mmol/L 28  25  25    Calcium 8.9 - 10.3 mg/dL 9.6  9.4  60.4   Total Protein 6.5 - 8.1 g/dL 7.7     Total Bilirubin 0.3 - 1.2 mg/dL 0.8     Alkaline Phos 38 - 126 U/L 42     AST 15 -  41 U/L 24     ALT 0 - 44 U/L 19         RADIOGRAPHIC STUDIES: I have personally reviewed the radiological images as listed and agreed with the findings in the report. NM PET (PSMA) SKULL TO MID THIGH  Result Date: 03/14/2023 CLINICAL DATA:  Elevated PSA equal 167. EXAM: NUCLEAR MEDICINE PET SKULL BASE TO THIGH TECHNIQUE: 9.7 mCi F18 Piflufolastat (Pylarify) was injected intravenously. Full-ring PET imaging was performed from the skull base to thigh after the radiotracer. CT data was obtained and used for attenuation correction and anatomic localization. COMPARISON:  None Available. FINDINGS: NECK No radiotracer activity in neck lymph nodes. Incidental CT finding: None. CHEST Intense radiotracer avid RIGHT lower paratracheal nodes. Conglomerate of approximately 3 nodes measures 1.7 x 4.2 cm (image 101) with SUV max equal 22. No suspicious pulmonary nodules Nodular implant beneath the RIGHT diaphragm at the level of the upper pole of the RIGHT kidney measures 19 mm on image 143 with SUV max equal 15. Incidental CT finding: None. ABDOMEN/PELVIS Prostate: Intense radiotracer activity involves the near entirety of the RIGHT lobe of the prostate gland with extension to the LEFT lobe. Activity is intense with SUV max equal 19 (image 211). Abnormal radiotracer activity extends into the RIGHT seminal vesicle (image 200) Lymph nodes: Large intense radiotracer avid lymph node posterior RIGHT of the prostate gland adjacent to the rectum measures 14 mm (219) with SUV max equal 27 (image 209) Similar large lymph node in the presacral space measures 19 mm with SUV max equal 30 on image 196. Smaller intense radiotracer avid node in the presacral space on image 190 Liver: No evidence of liver metastasis. Incidental CT finding: None. SKELETON Intense radiotracer avid lesions in the LEFT and RIGHT posterior iliac bones associated sclerotic lesions. For example along the RIGHT with SUV max equal 8.9. Lesion in the midthoracic  spine at T7 and and T10. Lesion in the RIGHT acromion process is intense IMPRESSION: 1. Intense radiotracer activity involving the near entirety of the RIGHT lobe of the prostate gland with extension to the LEFT lobe consistent with primary prostate adenocarcinoma. 2. Intense radiotracer activity extends into the RIGHT seminal vesicle consistent with nodal metastasis. 3. Intense radiotracer avid lymph nodes in the pelvis consistent with nodal metastasis. 4. Intense radiotracer avid metastatic adenopathy in the RIGHT lower paratracheal space consistent with mediastinal nodal metastasis. 5. Intense radiotracer avid metastatic disease to the skeleton with involvement of the thoracic spine, iliac bones, and RIGHT acromion process. 6. Nodular implant beneath the RIGHT diaphragm at the level of the upper pole of the RIGHT kidney consistent with metastatic implant. Electronically Signed   By: Genevive Bi M.D.   On: 03/14/2023 16:36   CT ANGIO CHEST AORTA W/CM & OR WO/CM  Result Date: 02/21/2023 CLINICAL DATA:  Tortuous Aorta on Chest X-ray, rule out aortic aneurysm EXAM: CT ANGIOGRAPHY CHEST WITH CONTRAST TECHNIQUE: Multidetector CT imaging of the chest was performed using the standard protocol during bolus administration  of intravenous contrast. Multiplanar CT image reconstructions and MIPs were obtained to evaluate the vascular anatomy. RADIATION DOSE REDUCTION: This exam was performed according to the departmental dose-optimization program which includes automated exposure control, adjustment of the mA and/or kV according to patient size and/or use of iterative reconstruction technique. CONTRAST:  81mL ISOVUE-300 IOPAMIDOL (ISOVUE-300) INJECTION 61% COMPARISON:  Chest XR, 01/06/2023.  CT chest, 05/02/2008. FINDINGS: CARDIOVASCULAR: Limitations by motion: Moderate Preferential opacification of the thoracic aorta. No evidence of thoracic aortic dissection. Aortic Root: Motion degraded, such that the aortic root  can not be accurately measured. Thoracic Aorta: --Ascending Aorta: 4.0 cm --Aortic Arch: 2.8 cm --Descending Aorta: 2.6 cm Other: LEFT aortic arch and common "bovine" variant, with shared origin of the brachiocephalic and LEFT common carotid arteries. Normal heart size. No pericardial effusion. Severe multivessel coronary atherosclerosis, greatest burden within the LAD. No segmental or larger pulmonary embolus. Mediastinum/Nodes: Enlarged RIGHT hilar lymph nodes. See key image. No enlarged mediastinal or axillary lymph nodes. Thyroid gland, trachea, and esophagus demonstrate no significant findings. Lungs/Pleura: LEFT basilar subpleural nodules, stable since 2009. Trace RIGHT posterior apical scarring without a discrete nodule. Lungs are otherwise clear without focal consolidation. No pleural effusion or pneumothorax. Upper Abdomen: No acute abnormality. Sub-1 cm hepatic hypodensities, too small to adequately characterize though likely small cysts versus hemangiomas. Inferior pole accessory spleen. Mild burden of aortic atherosclerosis. Musculoskeletal: No acute chest wall abnormality. No acute osseous findings. Review of the MIP images confirms the above findings. IMPRESSION: 1. No acute intrathoracic abnormality. 2. 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 Continue annual imaging followup by CTA or MRA. This recommendation follows 2010 ACCF/AHA/AATS/ACR/ASA/SCA/SCAI/SIR/STS/SVM Guidelines for the Diagnosis and Management of Patients with Thoracic Aortic Disease. Circulation. 2010; 121: E952-W413. Aortic aneurysm NOS (ICD10-I71.9) 3. Severe burden multivessel coronary atherosclerosis, greatest within the LAD. 4. Enlarged RIGHT hilar lymph nodes, suspicious. RIGHT posterior apical scarring without a discrete nodule. No suspicious mass within the RIGHT lung. Consider follow-up CT chest in 3-6 months and Pulmonologist referral if continued clinical concern. These results will be called to the  ordering clinician or representative by the Radiologist Assistant, and communication documented in the PACS or [employer]. Roanna Banning, MD [facility] Electronically Signed   By: Roanna Banning M.D.   On: 02/21/2023 15:18   DG Chest 2 View  Result Date: 01/09/2023 CLINICAL DATA:  Chronic dry cough for 3 months EXAM: CHEST - 2 VIEW COMPARISON:  06/08/2015 FINDINGS: Normal heart size and pulmonary vascularity. Mildly tortuous thoracic aorta. Lungs clear. No pulmonary infiltrate, pleural effusion, or pneumothorax. Osseous structures unremarkable. IMPRESSION: No acute abnormalities. Electronically Signed   By: Ulyses Southward M.D.   On: 01/09/2023 16:17

## 2023-03-21 NOTE — Assessment & Plan Note (Addendum)
I will obtain his prostate biopsy results from urology office. CT/PET scan results were reviewed with patient.  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  Castration sensitive. Recommend to repeat another PSA level today. Check molecular testing NGS Treatment options was reviewed and discussed with patient Recommend antigen deprivation therapy-with Firmagon monthly ASAP.  Consider switch to Eligard every 6 months in the future. Discussed with patient about option of docetaxel every 3 weeks for 6 cycles versus ARPI, concurrent options. He is open to chemotherapy and has good performance status. I explained to the patient the risks and benefits of chemotherapy including all but not limited to infusion reaction, hair loss, hearing loss, mouth sore, nausea, vomiting, low blood counts, bleeding, heart failure, kidney failure, neuropathy and risk of life threatening infection and even death, secondary malignancy etc.   Patient voices understanding and willing to proceed chemotherapy.  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  He currently continues to work 3 days/week.  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  We discussed the option of started on androgen deprivation therapy ASAP.  Consider starting chemotherapy in 2 to 4 weeks. Alternative is to start on ARPI bridging to chemotherapy.     # Chemotherapy education; Medi port placement option was reviewed and discussed with patient. Antiemetics-Zofran and Compazine; EMLA cream sent to pharmacy Supportive care measures are necessary for patient well-being and will be provided as necessary. We spent sufficient time to discuss many aspect of care, questions were answered to patient's satisfaction.  He is considering selling his practice and retired.

## 2023-03-21 NOTE — Progress Notes (Signed)
START ON PATHWAY REGIMEN - Prostate     Darolutamide: A cycle is every 28 days:     Darolutamide    Docetaxel cycles 1 through 6: A cycle is every 21 days:     Docetaxel   **Always confirm dose/schedule in your pharmacy ordering system**  Patient Characteristics: Adenocarcinoma, Recurrent/New Systemic Disease (Including Biochemical Recurrence), Non-Castrate, M1 Histology: Adenocarcinoma Therapeutic Status: Recurrent/New Systemic Disease (Including Biochemical Recurrence) Intent of Therapy: Non-Curative / Palliative Intent, Discussed with Patient 

## 2023-03-21 NOTE — Assessment & Plan Note (Signed)
Discussed with patient.  He understands that his condition is not curable.  Treatment is with palliative intent.

## 2023-03-24 ENCOUNTER — Telehealth: Payer: Self-pay

## 2023-03-24 ENCOUNTER — Other Ambulatory Visit: Payer: Self-pay

## 2023-03-24 ENCOUNTER — Inpatient Hospital Stay: Payer: Medicare HMO

## 2023-03-24 DIAGNOSIS — C7951 Secondary malignant neoplasm of bone: Secondary | ICD-10-CM | POA: Diagnosis not present

## 2023-03-24 DIAGNOSIS — R350 Frequency of micturition: Secondary | ICD-10-CM | POA: Diagnosis not present

## 2023-03-24 DIAGNOSIS — Z5111 Encounter for antineoplastic chemotherapy: Secondary | ICD-10-CM | POA: Diagnosis not present

## 2023-03-24 DIAGNOSIS — C61 Malignant neoplasm of prostate: Secondary | ICD-10-CM

## 2023-03-24 MED ORDER — DEGARELIX ACETATE(240 MG DOSE) 120 MG/VIAL ~~LOC~~ SOLR
240.0000 mg | Freq: Once | SUBCUTANEOUS | Status: AC
  Administered 2023-03-24: 240 mg via SUBCUTANEOUS
  Filled 2023-03-24: qty 6

## 2023-03-24 NOTE — Telephone Encounter (Signed)
Pt scheduled to get Firmagon today 7/22. Please schedule him for Firmagon injection in 4 weeks. Will give him appt when he is here this afternoon.

## 2023-03-24 NOTE — Telephone Encounter (Signed)
-----   Message from Rickard Patience sent at 03/21/2023  5:01 PM EDT ----- Please obtain his prostate biopsy results done at [facility] urology at increased follow [I believe that is what he told me, please confirm] Tentatively schedule him to get Firmagon injection scheduled 4 weeks after his first injection.  Also please put him on TBD reminder.  Please make a follow-up phone call in about 2 weeks to see if he would like to proceed with docetaxel treatments. 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  He is currently still working 3 days/week at the dentist.  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 Currently follow-up appointment is TBD.  Thank you

## 2023-03-25 ENCOUNTER — Encounter: Payer: Self-pay | Admitting: Urology

## 2023-03-25 ENCOUNTER — Encounter: Payer: Self-pay | Admitting: Oncology

## 2023-03-26 ENCOUNTER — Other Ambulatory Visit: Payer: Self-pay

## 2023-03-28 ENCOUNTER — Inpatient Hospital Stay: Payer: Medicare HMO

## 2023-03-31 ENCOUNTER — Encounter: Payer: Self-pay | Admitting: Oncology

## 2023-04-04 ENCOUNTER — Telehealth: Payer: Self-pay

## 2023-04-04 NOTE — Telephone Encounter (Signed)
[facility] NGS requested on [id number], prostate biopsy, collected 02/17/2023.

## 2023-04-17 DIAGNOSIS — C61 Malignant neoplasm of prostate: Secondary | ICD-10-CM | POA: Diagnosis not present

## 2023-04-18 ENCOUNTER — Ambulatory Visit: Payer: Medicare HMO | Admitting: Urology

## 2023-04-18 ENCOUNTER — Encounter: Payer: Self-pay | Admitting: Urology

## 2023-04-18 VITALS — BP 144/78 | HR 65 | Ht 69.0 in | Wt 174.0 lb

## 2023-04-18 DIAGNOSIS — R399 Unspecified symptoms and signs involving the genitourinary system: Secondary | ICD-10-CM

## 2023-04-18 DIAGNOSIS — C61 Malignant neoplasm of prostate: Secondary | ICD-10-CM | POA: Diagnosis not present

## 2023-04-18 MED ORDER — GEMTESA 75 MG PO TABS: 75.0000 mg | ORAL_TABLET | Freq: Every day | ORAL | Status: DC

## 2023-04-18 MED ORDER — TAMSULOSIN HCL 0.4 MG PO CAPS: 0.4000 mg | ORAL_CAPSULE | Freq: Every day | ORAL | 3 refills | Status: DC

## 2023-04-18 NOTE — Progress Notes (Signed)
I, Maysun Anabel Bene, acting as a scribe for [name], MD., have documented all relevant documentation on the behalf of [name], MD, as directed by [name], MD while in the presence of [name], MD.  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  Was taking saw palmetto and tadalafil for lower urinary tract symptoms in April 2024.  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 PSMA/PET remarkable for adenopathy of the lower paratracheal nodes, pelvic nodes, pre-presacral nodes. Implants noted beneath the right diaphragm and the upper pole of the right kidney and intense radio tracer avid lesions of the bony pelvis, Subsequently referred to Dr. Cathie Hoops in medical oncology and started on Firmagon and will be converted to Eligard.  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  He has noted increased urinary frequency, urgency and urge incontinence, mild urinary hesitancy, and decreased force in caliber of his urinary stream. He is interested in further medical management for his lower urinary tract symptoms.    PMH: Past Medical History:  Diagnosis Date   Abdominal wall hernia 03/20/2018   ATTENTION DEFICIT, W/O HYPERACTIVITY 10/30/2006   Qualifier: History of  By: McDiarmid MD, Tawanna Cooler     Cataract    bilateral cataracts with lens implant   Chest pain, atypical 12/05/2017   COLON POLYP 02/07/2014   Tubular Adenoma x 1 - Dr  Christella Hartigan (GI)   Diverticulosis of colon 07/09/2018   Based on colonoscopy 06/2108   Encounter for neuropsychological testing 06/26/2015   [facility]: Only B/L avg in rote memory auditory info encoding. No mood dysfnc   Encounter for neuropsychological testing 2008   [facility] (Neuropsy): Mixed anxiety and depression o/w normal.    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  Repeat testing 06/2015 at [facility]: Only finding was below average in rote memory auditory info encoding. No mood dysfunction.    Obesity (BMI 30.0-34.9) 10/30/2006   Qualifier: History of   By: McDiarmid MD, Todd       Overweight (BMI 25.0-29.9) 10/30/2006   Qualifier: History of  By: McDiarmid MD, Todd     Prediabetes 12/21/2013   Pulmonary nodule, left 06/09/2015   Pure hypercholesterolemia 10/30/2006   ACC calculation 10-yr risk of CV event of 9.5% with recommendation to consider Statin therapy of moderate-to-high potency (12/21/13)     Rotator cuff tear 12/08/2020   Shoulder injury, right, initial encounter 11/02/2020    Surgical  History: Past Surgical History:  Procedure Laterality Date   CATARACT EXTRACTION W/ INTRAOCULAR LENS  IMPLANT, BILATERAL     COCCYGECTOMY  1985   VASECTOMY      Home Medications:  Allergies as of 04/18/2023       Reactions   Cephalosporins Rash        Medication List        Accurate as of April 18, 2023  1:44 PM. If you have any questions, ask your  nurse or doctor.          Gemtesa 75 MG Tabs Generic drug: Vibegron Take 1 tablet (75 mg total) by mouth daily. Started by: [name]   lisinopril-hydrochlorothiazide 10-12.5 MG tablet Commonly known as: ZESTORETIC Take 1 tablet by mouth daily.   tadalafil 5 MG tablet Commonly known as: CIALIS TAKE 1 TABLET BY MOUTH ONCE DAILY AS NEEDED   tamsulosin 0.4 MG Caps capsule Commonly known as: FLOMAX Take 1 capsule (0.4 mg total) by mouth daily. Started by: [name]        Allergies:  Allergies  Allergen Reactions   Cephalosporins Rash    Family History: Family History  Problem Relation Age of Onset   Hypertension Mother    Stroke Mother 42   Dementia Mother 29   Diabetes Father    Diabetes Sister    Diabetes Brother    Esophageal cancer Brother    Lymphoma Brother    Esophageal cancer Brother    Lymphoma Brother    Diabetes Brother    Colon cancer Neg Hx     Social History:  reports that he has never smoked. He has never used smokeless tobacco. He reports current alcohol use of about 3.0 standard drinks of alcohol per week. He reports that he does not use drugs.   Physical Exam: BP (!) 144/78   Pulse 65   Ht 5\' 9"  (1.753 m)   Wt 174 lb (78.9 kg)   BMI 25.70 kg/m   Constitutional:  Alert and oriented, No acute distress. HEENT: [place] AT Respiratory: Normal respiratory effort, no increased work of breathing. Psychiatric: Normal mood and affect.   Pertinent Imaging: PET scan was personally reviewed and interpreted.   PET/PSMA EXAM: NUCLEAR MEDICINE PET SKULL BASE TO THIGH   TECHNIQUE: 9.7 mCi F18 Piflufolastat (Pylarify) was injected intravenously. Full-ring PET imaging was performed from the skull base to thigh after the radiotracer. CT data was obtained and used for attenuation correction and anatomic localization.   COMPARISON:  None Available.   FINDINGS: NECK   No radiotracer activity in neck lymph nodes.   Incidental CT  finding: None.   CHEST   Intense radiotracer avid RIGHT lower paratracheal nodes. Conglomerate of approximately 3 nodes measures 1.7 x 4.2 cm (image 101) with SUV max equal 22. No suspicious pulmonary nodules   Nodular implant beneath the RIGHT diaphragm at the level of the upper pole of the RIGHT kidney measures 19 mm on image 143 with SUV max equal 15.   Incidental CT finding: None.   ABDOMEN/PELVIS   Prostate: Intense radiotracer activity involves the near entirety of the RIGHT lobe of the prostate gland with extension to the LEFT lobe. Activity is intense with SUV max equal 19 (image 211). Abnormal radiotracer activity extends into the RIGHT seminal vesicle (image 200)   Lymph nodes: Large intense radiotracer avid lymph node posterior RIGHT of the prostate gland adjacent to the rectum measures 14 mm (219) with SUV max equal 27 (image  209)   Similar large lymph node in the presacral space measures 19 mm with SUV max equal 30 on image 196. Smaller intense radiotracer avid node in the presacral space on image 190   Liver: No evidence of liver metastasis.   Incidental CT finding: None.   SKELETON   Intense radiotracer avid lesions in the LEFT and RIGHT posterior iliac bones associated sclerotic lesions. For example along the RIGHT with SUV max equal 8.9. Lesion in the midthoracic spine at T7 and and T10. Lesion in the RIGHT acromion process is intense   IMPRESSION: 1. Intense radiotracer activity involving the near entirety of the RIGHT lobe of the prostate gland with extension to the LEFT lobe consistent with primary prostate adenocarcinoma. 2. Intense radiotracer activity extends into the RIGHT seminal vesicle consistent with nodal metastasis. 3. Intense radiotracer avid lymph nodes in the pelvis consistent with nodal metastasis. 4. Intense radiotracer avid metastatic adenopathy in the RIGHT lower paratracheal space consistent with mediastinal nodal metastasis. 5.  Intense radiotracer avid metastatic disease to the skeleton with involvement of the thoracic spine, iliac bones, and RIGHT acromion process. 6. Nodular implant beneath the RIGHT diaphragm at the level of the upper pole of the RIGHT kidney consistent with metastatic implant.     Electronically Signed   By: Genevive Bi M.D.   On: 03/14/2023 16:36  Assessment & Plan:    1. Castration-naive metastatic prostate cancer ADT+chemo per medical oncology.   2. Lower urinary tract symptoms Prostate volume at time of biopsy was 100 grams.  Will start tamsulosin 0.4 mg daily.  If he has seen no improvement in his symptoms after 1 month, we'll add Gemtesa 75 mg daily. He was given Gemtesa samples and will start if no improvement with tamsulosin.  Follow up 3 months or as needed for worsening voiding symptoms.  I have reviewed the above documentation for accuracy and completeness, and I agree with the above.   [name], MD  [facility] [address], [address] [place], [place] [address] [phone]

## 2023-04-19 ENCOUNTER — Other Ambulatory Visit: Payer: Self-pay

## 2023-04-21 ENCOUNTER — Other Ambulatory Visit: Payer: Self-pay | Admitting: Oncology

## 2023-04-21 ENCOUNTER — Ambulatory Visit: Payer: Medicare HMO

## 2023-04-21 ENCOUNTER — Inpatient Hospital Stay: Payer: Medicare HMO | Attending: Oncology

## 2023-04-21 ENCOUNTER — Encounter: Payer: Self-pay | Admitting: Oncology

## 2023-04-21 ENCOUNTER — Inpatient Hospital Stay (HOSPITAL_BASED_OUTPATIENT_CLINIC_OR_DEPARTMENT_OTHER): Payer: Medicare HMO | Admitting: Oncology

## 2023-04-21 ENCOUNTER — Inpatient Hospital Stay: Payer: Medicare HMO

## 2023-04-21 VITALS — BP 134/72 | HR 58 | Temp 97.3°F | Resp 18 | Wt 178.5 lb

## 2023-04-21 DIAGNOSIS — Z79899 Other long term (current) drug therapy: Secondary | ICD-10-CM | POA: Insufficient documentation

## 2023-04-21 DIAGNOSIS — C61 Malignant neoplasm of prostate: Secondary | ICD-10-CM | POA: Insufficient documentation

## 2023-04-21 DIAGNOSIS — C775 Secondary and unspecified malignant neoplasm of intrapelvic lymph nodes: Secondary | ICD-10-CM | POA: Insufficient documentation

## 2023-04-21 DIAGNOSIS — Z5111 Encounter for antineoplastic chemotherapy: Secondary | ICD-10-CM | POA: Diagnosis not present

## 2023-04-21 DIAGNOSIS — Z79818 Long term (current) use of other agents affecting estrogen receptors and estrogen levels: Secondary | ICD-10-CM | POA: Insufficient documentation

## 2023-04-21 DIAGNOSIS — C7951 Secondary malignant neoplasm of bone: Secondary | ICD-10-CM | POA: Diagnosis not present

## 2023-04-21 DIAGNOSIS — R350 Frequency of micturition: Secondary | ICD-10-CM

## 2023-04-21 LAB — CMP (CANCER CENTER ONLY)
ALT: 22 U/L (ref 0–44)
AST: 23 U/L (ref 15–41)
Albumin: 4.6 g/dL (ref 3.5–5.0)
Alkaline Phosphatase: 40 U/L (ref 38–126)
Anion gap: 7 (ref 5–15)
BUN: 28 mg/dL — ABNORMAL HIGH (ref 8–23)
CO2: 25 mmol/L (ref 22–32)
Calcium: 9.4 mg/dL (ref 8.9–10.3)
Chloride: 104 mmol/L (ref 98–111)
Creatinine: 0.96 mg/dL (ref 0.61–1.24)
GFR, Estimated: >60 mL/min (ref >60)
Glucose, Bld: 98 mg/dL (ref 70–99)
Potassium: 4.2 mmol/L (ref 3.5–5.1)
Sodium: 136 mmol/L (ref 135–145)
Total Bilirubin: 0.5 mg/dL (ref 0.3–1.2)
Total Protein: 7.5 g/dL (ref 6.5–8.1)

## 2023-04-21 LAB — CBC WITH DIFFERENTIAL (CANCER CENTER ONLY)
Abs Immature Granulocytes: 0.02 10*3/uL (ref 0.00–0.07)
Basophils Absolute: 0 10*3/uL (ref 0.0–0.1)
Basophils Relative: 0 %
Eosinophils Absolute: 0.1 10*3/uL (ref 0.0–0.5)
Eosinophils Relative: 1 %
HCT: 41.7 % (ref 39.0–52.0)
Hemoglobin: 13.7 g/dL (ref 13.0–17.0)
Immature Granulocytes: 0 %
Lymphocytes Relative: 38 %
Lymphs Abs: 2.6 10*3/uL (ref 0.7–4.0)
MCH: 27.3 pg (ref 26.0–34.0)
MCHC: 32.9 g/dL (ref 30.0–36.0)
MCV: 83.1 fL (ref 80.0–100.0)
Monocytes Absolute: 0.6 10*3/uL (ref 0.1–1.0)
Monocytes Relative: 9 %
Neutro Abs: 3.5 10*3/uL (ref 1.7–7.7)
Neutrophils Relative %: 52 %
Platelet Count: 206 10*3/uL (ref 150–400)
RBC: 5.02 MIL/uL (ref 4.22–5.81)
RDW: 13.5 % (ref 11.5–15.5)
WBC Count: 6.9 10*3/uL (ref 4.0–10.5)
nRBC: 0 % (ref 0.0–0.2)

## 2023-04-21 LAB — PSA: Prostatic Specific Antigen: 7.84 ng/mL — ABNORMAL HIGH (ref 0.00–4.00)

## 2023-04-21 MED ORDER — DEGARELIX ACETATE 80 MG ~~LOC~~ SOLR
80.0000 mg | Freq: Once | SUBCUTANEOUS | Status: AC
  Administered 2023-04-21: 80 mg via SUBCUTANEOUS
  Filled 2023-04-21: qty 4

## 2023-04-21 NOTE — Assessment & Plan Note (Addendum)
I will obtain his prostate biopsy results from urology office. CT/PET scan results were reviewed with patient.  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  Castration sensitive. Recommend to repeat another PSA level today. Check molecular testing NGS Treatment options was reviewed and discussed with patient Recommend antigen deprivation therapy-with Firmagon monthly ASAP.  Consider switch to Eligard every 6 months in the future. Discussed with patient about option of docetaxel every 3 weeks for 6 cycles versus ARPI, concurrent options. He is open to chemotherapy and has good performance status. 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  Given that he may have to continue to work for a period of time, shared decision was made to start with ARPI first, pending decision of chemothrapy with Docetaxel one he retires.  Plan Darolutamide600 mg twice daily, will check insurance approval.  Rationale and side effects were reviewed with patient.

## 2023-04-21 NOTE — Assessment & Plan Note (Signed)
Firmagon today.  Recommend switch to Eligard 45mg  Q6 months in 4 weeks.

## 2023-04-21 NOTE — Progress Notes (Signed)
Hematology/Oncology Progress note [phone] [phone]           REFERRING PROVIDER: McDiarmid, Leighton Roach, MD   CHIEF COMPLAINTS/REASON FOR VISIT:  Metastatic prostate cancer   ASSESSMENT & PLAN:   Cancer Staging  Prostate cancer [facility]) Staging form: Prostate, AJCC 8th Edition - Clinical stage from 03/21/2023: Stage IVA (cT3b, cN1, cM0, PSA: 167) - Signed by Rickard Patience, MD on 03/21/2023   Prostate cancer [facility]) I will obtain his prostate biopsy results from urology office. CT/PET scan results were reviewed with patient.  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  Castration sensitive. Recommend to repeat another PSA level today. Check molecular testing NGS Treatment options was reviewed and discussed with patient Recommend antigen deprivation therapy-with Firmagon monthly ASAP.  Consider switch to Eligard every 6 months in the future. Discussed with patient about option of docetaxel every 3 weeks for 6 cycles versus ARPI, concurrent options. He is open to chemotherapy and has good performance status. 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  Given that he may have to continue to work for a period of time, shared decision was made to start with ARPI first, pending decision of chemothrapy with Docetaxel one he retires.  Plan Darolutamide600 mg twice daily, will check insurance approval.  Rationale and side effects were reviewed with patient.    Urinary frequency secondary to enlarged prostate. He has established care with Dr.Stoioff, has started on Flomax.    Androgen deprivation therapy Deborra Medina today.  Recommend switch to Eligard 45mg  Q6 months in 4  weeks.     Orders Placed This Encounter  Procedures   CBC with Differential (Cancer Center Only)    Standing Status:   Future    Number of Occurrences:   1    Standing Expiration Date:   04/20/2024   CMP (Cancer Center only)    Standing Status:   Future    Number of Occurrences:   1    Standing Expiration Date:   04/20/2024   PSA    Standing Status:   Future    Number of Occurrences:   1    Standing Expiration Date:   04/20/2024   Follow-up -TBD All questions were answered. The patient knows to call the clinic with any problems, questions or concerns.  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  Oncology History  Prostate cancer (HCC)  02/21/2023 Imaging   CT angiogram chest aorta with contrast showed 1. No acute intrathoracic abnormality. 2. 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 Continue annual imaging followup by CTA or MRA. This recommendation follows 2010 ACCF/AHA/AATS/ACR/ASA/SCA/SCAI/SIR/STS/SVM Guidelines for the Diagnosis and Management of Patients with Thoracic Aortic Disease. Circulation. 2010; 121: X914-N829. Aortic aneurysm NOS (ICD10-I71.9) 3. Severe burden multivessel coronary atherosclerosis, greatest within the LAD. 4. Enlarged RIGHT hilar lymph nodes, suspicious. RIGHT posterior apical scarring without a discrete nodule. No suspicious mass within the RIGHT lung.   03/11/2023 Initial Diagnosis   Prostate  cancer [facility])  April 2024, patient's PSA was elevated 167. Patient saw urologist.  Status post prostate biopsy.  Results were not available in epic.  Per patient he was diagnosed with prostate cancer.   03/14/2023 Imaging   PSMA PET scan showed 1. Intense radiotracer activity involving the near entirety of the RIGHT  lobe of the prostate gland with extension to the LEFT lobe consistent with primary prostate adenocarcinoma. 2. Intense radiotracer activity extends into the RIGHT seminal vesicle consistent with nodal metastasis. 3. Intense radiotracer avid lymph nodes in the pelvis consistent with nodal metastasis. 4. Intense radiotracer avid metastatic adenopathy in the RIGHT lower paratracheal space consistent with mediastinal nodal metastasis. 5. Intense radiotracer avid metastatic disease to the skeleton with involvement of the thoracic spine, iliac bones, and RIGHT acromion process. 6. Nodular implant beneath the RIGHT diaphragm at the level of the upper pole of the RIGHT kidney consistent with metastatic implant.   03/21/2023 Cancer Staging   Staging form: Prostate, AJCC 8th Edition - Clinical stage from 03/21/2023: Stage IVA (cT3b, cN1, cM0, PSA: 167) - Signed by Rickard Patience, MD on 03/21/2023 Stage prefix: Initial diagnosis Prostate specific antigen (PSA) range: 20 or greater   03/22/2023 -  Chemotherapy   Patient is on Treatment Plan : PROSTATE Docetaxel (75) q21d       He has established care with Urology, and has started on Flomax. Urinary urgency has improved.  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   MEDICAL HISTORY:  Past Medical History:  Diagnosis Date   Abdominal wall hernia 03/20/2018   ATTENTION DEFICIT, W/O HYPERACTIVITY 10/30/2006   Qualifier: History of  By: McDiarmid MD, Tawanna Cooler     Cataract    bilateral cataracts with lens implant   Chest pain, atypical 12/05/2017   COLON POLYP 02/07/2014   Tubular Adenoma x 1 - Dr Christella Hartigan (GI)   Diverticulosis of colon 07/09/2018   Based on colonoscopy 06/2108   Encounter for neuropsychological testing 06/26/2015   [facility]: Only B/L avg in rote memory auditory info encoding. No mood dysfnc   Encounter for neuropsychological testing 2008   [facility] Grates (Neuropsy): Mixed  anxiety and depression o/w normal.    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  Repeat testing 06/2015 at [facility]: Only finding was below average in rote memory auditory info encoding. No mood dysfunction.    Obesity (BMI 30.0-34.9) 10/30/2006   Qualifier: History of   By: McDiarmid MD, Todd       Overweight (BMI 25.0-29.9) 10/30/2006   Qualifier: History of  By: McDiarmid MD, Todd     Prediabetes 12/21/2013   Pulmonary nodule, left 06/09/2015   Pure hypercholesterolemia 10/30/2006   ACC calculation 10-yr risk of CV event of 9.5% with recommendation to consider Statin therapy of moderate-to-high potency (12/21/13)     Rotator cuff tear 12/08/2020   Shoulder injury, right, initial encounter 11/02/2020    SURGICAL HISTORY: Past Surgical History:  Procedure Laterality  Date   CATARACT EXTRACTION W/ INTRAOCULAR LENS  IMPLANT, BILATERAL     COCCYGECTOMY  1985   VASECTOMY      SOCIAL HISTORY: Social History   Socioeconomic History   Marital status: Married    Spouse name: Darl Pikes   Number of children: 4   Years of education: >20   Highest education level: Not on file  Occupational History   Occupation: Education officer, community   Occupation: Pharmacist  Tobacco Use   Smoking status: Never   Smokeless tobacco: Never  Vaping Use   Vaping status: Never Used  Substance and Sexual Activity   Alcohol use: Yes    Alcohol/week: 3.0 standard drinks of alcohol    Types: 3 drink(s) per week   Drug  use: No   Sexual activity: Yes    Comment: monogamous  Other Topics Concern   Not on file  Social History Narrative   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   4 adult sons ([place], [place], [place]): 3 biological and 1 step-child   Patient is third of six children   No formal HCPOA executed (12/21/13)   No Advanced Directive executed (12/21/13)   He and wife are raising a granddaughter (2016)   Hobbies: Motorcycling and Horse riding.         Social Determinants of Health   Financial Resource Strain: Not on file  Food Insecurity: No Food Insecurity (03/21/2023)   Hunger Vital Sign    Worried About Running Out of Food in the Last Year: Never true    Ran Out of Food in the Last Year: Never true  Transportation Needs: No Transportation Needs (03/21/2023)   PRAPARE - Administrator, Civil Service (Medical): No    Lack of Transportation (Non-Medical): No  Physical Activity: Not on file  Stress: Not on file  Social Connections: Not on file  Intimate Partner Violence: Not At Risk (03/21/2023)   Humiliation, Afraid, Rape, and Kick questionnaire    Fear of Current or Ex-Partner: No    Emotionally Abused: No    Physically Abused: No    Sexually Abused: No    FAMILY HISTORY: Family History  Problem Relation Age of Onset   Hypertension Mother    Stroke Mother 84   Dementia Mother 5   Diabetes Father    Diabetes Sister    Diabetes Brother    Esophageal cancer Brother    Lymphoma Brother    Esophageal cancer Brother    Lymphoma Brother    Diabetes Brother    Colon cancer Neg Hx     ALLERGIES:  is allergic to cephalosporins.  MEDICATIONS:  Current Outpatient Medications  Medication Sig Dispense Refill   lisinopril-hydrochlorothiazide (ZESTORETIC) 10-12.5 MG tablet Take 1 tablet by mouth daily. 90 tablet 3   tadalafil (CIALIS) 5 MG tablet TAKE 1 TABLET BY MOUTH ONCE DAILY AS NEEDED 90 tablet 3   tamsulosin (FLOMAX) 0.4 MG CAPS capsule Take 1 capsule (0.4 mg  total) by mouth daily. 30 capsule 3   Vibegron (GEMTESA) 75 MG TABS Take 1 tablet (75 mg total) by mouth daily. (Patient not taking: Reported on 04/21/2023)     No current facility-administered medications for this visit.    Review of Systems  Constitutional:  Negative for appetite change, chills, fatigue, fever and unexpected weight change.  HENT:   Negative for hearing loss and voice change.   Eyes:  Negative for eye problems and icterus.  Respiratory:  Negative for chest tightness,  cough and shortness of breath.   Cardiovascular:  Negative for chest pain and leg swelling.  Gastrointestinal:  Negative for abdominal distention and abdominal pain.  Endocrine: Negative for hot flashes.  Genitourinary:  Negative for difficulty urinating, dysuria and frequency.   Musculoskeletal:  Positive for arthralgias and back pain.  Skin:  Negative for itching and rash.  Neurological:  Negative for light-headedness and numbness.  Hematological:  Negative for adenopathy. Does not bruise/bleed easily.  Psychiatric/Behavioral:  Negative for confusion.    PHYSICAL EXAMINATION: ECOG PERFORMANCE STATUS: 0 - Asymptomatic Vitals:   04/21/23 1128  BP: 134/72  Pulse: (!) 58  Resp: 18  Temp: (!) 97.3 F (36.3 C)   Filed Weights   04/21/23 1128  Weight: 178 lb 8 oz (81 kg)    Physical Exam Constitutional:      General: He is not in acute distress. HENT:     Head: Normocephalic and atraumatic.  Eyes:     General: No scleral icterus. Cardiovascular:     Rate and Rhythm: Normal rate and regular rhythm.     Heart sounds: Normal heart sounds.  Pulmonary:     Effort: Pulmonary effort is normal. No respiratory distress.     Breath sounds: No wheezing.  Abdominal:     General: Bowel sounds are normal. There is no distension.     Palpations: Abdomen is soft.  Musculoskeletal:        General: No deformity. Normal range of motion.     Cervical back: Normal range of motion and neck supple.  Skin:     General: Skin is warm and dry.     Findings: No erythema or rash.  Neurological:     Mental Status: He is alert and oriented to person, place, and time. Mental status is at baseline.     Cranial Nerves: No cranial nerve deficit.     Coordination: Coordination normal.  Psychiatric:        Mood and Affect: Mood normal.     LABORATORY DATA:  I have reviewed the data as listed    Latest Ref Rng & Units 04/21/2023   11:53 AM 03/21/2023   12:17 PM 11/29/2021   11:08 AM  CBC  WBC 4.0 - 10.5 K/uL 6.9  7.9  7.5   Hemoglobin 13.0 - 17.0 g/dL 16.1  09.6  04.5   Hematocrit 39.0 - 52.0 % 41.7  45.6  44.4   Platelets 150 - 400 K/uL 206  214  222       Latest Ref Rng & Units 04/21/2023   11:52 AM 03/21/2023   12:17 PM 12/26/2022    5:40 PM  CMP  Glucose 70 - 99 mg/dL 98  409  87   BUN 8 - 23 mg/dL 28  20  31    Creatinine 0.61 - 1.24 mg/dL 8.11  9.14  7.82   Sodium 135 - 145 mmol/L 136  137  139   Potassium 3.5 - 5.1 mmol/L 4.2  4.4  4.9   Chloride 98 - 111 mmol/L 104  101  100   CO2 22 - 32 mmol/L 25  28  25    Calcium 8.9 - 10.3 mg/dL 9.4  9.6  9.4   Total Protein 6.5 - 8.1 g/dL 7.5  7.7    Total Bilirubin 0.3 - 1.2 mg/dL 0.5  0.8    Alkaline Phos 38 - 126 U/L 40  42    AST 15 - 41 U/L 23  24  ALT 0 - 44 U/L 22  19        RADIOGRAPHIC STUDIES: I have personally reviewed the radiological images as listed and agreed with the findings in the report. NM PET (PSMA) SKULL TO MID THIGH  Result Date: 03/14/2023 CLINICAL DATA:  Elevated PSA equal 167. EXAM: NUCLEAR MEDICINE PET SKULL BASE TO THIGH TECHNIQUE: 9.7 mCi F18 Piflufolastat (Pylarify) was injected intravenously. Full-ring PET imaging was performed from the skull base to thigh after the radiotracer. CT data was obtained and used for attenuation correction and anatomic localization. COMPARISON:  None Available. FINDINGS: NECK No radiotracer activity in neck lymph nodes. Incidental CT finding: None. CHEST Intense radiotracer avid RIGHT  lower paratracheal nodes. Conglomerate of approximately 3 nodes measures 1.7 x 4.2 cm (image 101) with SUV max equal 22. No suspicious pulmonary nodules Nodular implant beneath the RIGHT diaphragm at the level of the upper pole of the RIGHT kidney measures 19 mm on image 143 with SUV max equal 15. Incidental CT finding: None. ABDOMEN/PELVIS Prostate: Intense radiotracer activity involves the near entirety of the RIGHT lobe of the prostate gland with extension to the LEFT lobe. Activity is intense with SUV max equal 19 (image 211). Abnormal radiotracer activity extends into the RIGHT seminal vesicle (image 200) Lymph nodes: Large intense radiotracer avid lymph node posterior RIGHT of the prostate gland adjacent to the rectum measures 14 mm (219) with SUV max equal 27 (image 209) Similar large lymph node in the presacral space measures 19 mm with SUV max equal 30 on image 196. Smaller intense radiotracer avid node in the presacral space on image 190 Liver: No evidence of liver metastasis. Incidental CT finding: None. SKELETON Intense radiotracer avid lesions in the LEFT and RIGHT posterior iliac bones associated sclerotic lesions. For example along the RIGHT with SUV max equal 8.9. Lesion in the midthoracic spine at T7 and and T10. Lesion in the RIGHT acromion process is intense IMPRESSION: 1. Intense radiotracer activity involving the near entirety of the RIGHT lobe of the prostate gland with extension to the LEFT lobe consistent with primary prostate adenocarcinoma. 2. Intense radiotracer activity extends into the RIGHT seminal vesicle consistent with nodal metastasis. 3. Intense radiotracer avid lymph nodes in the pelvis consistent with nodal metastasis. 4. Intense radiotracer avid metastatic adenopathy in the RIGHT lower paratracheal space consistent with mediastinal nodal metastasis. 5. Intense radiotracer avid metastatic disease to the skeleton with involvement of the thoracic spine, iliac bones, and RIGHT  acromion process. 6. Nodular implant beneath the RIGHT diaphragm at the level of the upper pole of the RIGHT kidney consistent with metastatic implant. Electronically Signed   By: Genevive Bi M.D.   On: 03/14/2023 16:36   CT ANGIO CHEST AORTA W/CM & OR WO/CM  Result Date: 02/21/2023 CLINICAL DATA:  Tortuous Aorta on Chest X-ray, rule out aortic aneurysm EXAM: CT ANGIOGRAPHY CHEST WITH CONTRAST TECHNIQUE: Multidetector CT imaging of the chest was performed using the standard protocol during bolus administration of intravenous contrast. Multiplanar CT image reconstructions and MIPs were obtained to evaluate the vascular anatomy. RADIATION DOSE REDUCTION: This exam was performed according to the departmental dose-optimization program which includes automated exposure control, adjustment of the mA and/or kV according to patient size and/or use of iterative reconstruction technique. CONTRAST:  81mL ISOVUE-300 IOPAMIDOL (ISOVUE-300) INJECTION 61% COMPARISON:  Chest XR, 01/06/2023.  CT chest, 05/02/2008. FINDINGS: CARDIOVASCULAR: Limitations by motion: Moderate Preferential opacification of the thoracic aorta. No evidence of thoracic aortic dissection. Aortic Root: Motion degraded, such  that the aortic root can not be accurately measured. Thoracic Aorta: --Ascending Aorta: 4.0 cm --Aortic Arch: 2.8 cm --Descending Aorta: 2.6 cm Other: LEFT aortic arch and common "bovine" variant, with shared origin of the brachiocephalic and LEFT common carotid arteries. Normal heart size. No pericardial effusion. Severe multivessel coronary atherosclerosis, greatest burden within the LAD. No segmental or larger pulmonary embolus. Mediastinum/Nodes: Enlarged RIGHT hilar lymph nodes. See key image. No enlarged mediastinal or axillary lymph nodes. Thyroid gland, trachea, and esophagus demonstrate no significant findings. Lungs/Pleura: LEFT basilar subpleural nodules, stable since 2009. Trace RIGHT posterior apical scarring without a  discrete nodule. Lungs are otherwise clear without focal consolidation. No pleural effusion or pneumothorax. Upper Abdomen: No acute abnormality. Sub-1 cm hepatic hypodensities, too small to adequately characterize though likely small cysts versus hemangiomas. Inferior pole accessory spleen. Mild burden of aortic atherosclerosis. Musculoskeletal: No acute chest wall abnormality. No acute osseous findings. Review of the MIP images confirms the above findings. IMPRESSION: 1. No acute intrathoracic abnormality. 2. 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 Continue annual imaging followup by CTA or MRA. This recommendation follows 2010 ACCF/AHA/AATS/ACR/ASA/SCA/SCAI/SIR/STS/SVM Guidelines for the Diagnosis and Management of Patients with Thoracic Aortic Disease. Circulation. 2010; 121: W098-J191. Aortic aneurysm NOS (ICD10-I71.9) 3. Severe burden multivessel coronary atherosclerosis, greatest within the LAD. 4. Enlarged RIGHT hilar lymph nodes, suspicious. RIGHT posterior apical scarring without a discrete nodule. No suspicious mass within the RIGHT lung. Consider follow-up CT chest in 3-6 months and Pulmonologist referral if continued clinical concern. These results will be called to the ordering clinician or representative by the Radiologist Assistant, and communication documented in the PACS or [employer]. Roanna Banning, MD [facility] Electronically Signed   By: Roanna Banning M.D.   On: 02/21/2023 15:18

## 2023-04-21 NOTE — Assessment & Plan Note (Addendum)
secondary to enlarged prostate. He has established care with Dr.Stoioff, has started on Flomax.

## 2023-04-22 ENCOUNTER — Telehealth: Payer: Self-pay

## 2023-04-22 ENCOUNTER — Telehealth: Payer: Self-pay | Admitting: Pharmacist

## 2023-04-22 ENCOUNTER — Encounter: Payer: Self-pay | Admitting: Oncology

## 2023-04-22 ENCOUNTER — Other Ambulatory Visit (HOSPITAL_COMMUNITY): Payer: Self-pay

## 2023-04-22 DIAGNOSIS — C61 Malignant neoplasm of prostate: Secondary | ICD-10-CM | POA: Diagnosis not present

## 2023-04-22 NOTE — Telephone Encounter (Signed)
Oral Oncology Patient Advocate Encounter  Prior Authorization for Merleen Nicely has been approved.    PA# [id number]  Effective dates: 09/02/22 through 09/02/23  Patients co-pay is $3,328.30.    Ardeen Fillers, CPhT Oncology Pharmacy Patient Advocate  [facility]  [phone] (phone) [phone] (fax) 04/22/2023 10:27 AM

## 2023-04-22 NOTE — Telephone Encounter (Addendum)
Report sent to scan in media  

## 2023-04-22 NOTE — Telephone Encounter (Signed)
Pharmacy Student Encounter   Received new prescription for darolutamide for the treatment of metastatic prostate cancer in conjunction with docetaxel, planned duration until disease progression or unacceptable toxicity. 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   Labs from 04/21/23 assessed, PSA 7.84 ng/mL (Elevated but decreasing).   Current medication list in Epic reviewed, No DDIs with darolutamide identified.  Evaluated chart and no patient barriers to medication adherence identified.   Oral Oncology Clinic will continue to follow for insurance authorization, copayment issues, initial counseling and start date.  Karis Juba  [facility] PharmD Candidate, Class of 2026 [facility] [phone]  04/22/2023 3:30 PM

## 2023-04-22 NOTE — Telephone Encounter (Signed)
Oral Oncology Patient Advocate Encounter   Began application for assistance for Nubeqa through [employer] [place] [employer].   Application will be submitted upon completion of necessary supporting documentation.   [employer]'s phone number [phone].   I will continue to check the status until final determination.    Ardeen Fillers, CPhT Oncology Pharmacy Patient Advocate  [facility]  [phone] (phone) [phone] (fax) 04/22/2023 11:15 AM

## 2023-04-22 NOTE — Telephone Encounter (Signed)
Oral Oncology Patient Advocate Encounter  New authorization   Received notification that prior authorization for Nubeqa is required.   PA submitted on 04/22/23  Key [id number]  Status is pending     Ardeen Fillers, CPhT Oncology Pharmacy Patient Advocate  [facility]  [phone] (phone) [phone] (fax) 04/22/2023 10:01 AM

## 2023-04-23 ENCOUNTER — Encounter: Payer: Self-pay | Admitting: Oncology

## 2023-04-24 NOTE — Telephone Encounter (Signed)
Opened in error

## 2023-04-24 NOTE — Telephone Encounter (Signed)
Patient returned my call and I was able to go over price of medication through insurance. Patient will not qualify for Patient Assistance and will not be able to afford the co-pay left by insurance. MD Office has been notified.    Ardeen Fillers, CPhT Oncology Pharmacy Patient Advocate  [facility]  [phone] (phone) [phone] (fax) 04/24/2023 3:38 PM

## 2023-04-24 NOTE — Telephone Encounter (Deleted)
Opened in error

## 2023-04-25 ENCOUNTER — Telehealth: Payer: Self-pay

## 2023-04-25 ENCOUNTER — Telehealth: Payer: Self-pay | Admitting: Pharmacist

## 2023-04-25 ENCOUNTER — Other Ambulatory Visit (HOSPITAL_COMMUNITY): Payer: Self-pay

## 2023-04-25 ENCOUNTER — Other Ambulatory Visit: Payer: Self-pay

## 2023-04-25 DIAGNOSIS — C61 Malignant neoplasm of prostate: Secondary | ICD-10-CM

## 2023-04-25 DIAGNOSIS — IMO0001 Reserved for inherently not codable concepts without codable children: Secondary | ICD-10-CM

## 2023-04-25 DIAGNOSIS — Z79818 Long term (current) use of other agents affecting estrogen receptors and estrogen levels: Secondary | ICD-10-CM

## 2023-04-25 MED ORDER — PREDNISONE 5 MG PO TABS
5.0000 mg | ORAL_TABLET | Freq: Every day | ORAL | 1 refills | Status: DC
Start: 2023-04-25 — End: 2023-06-20
  Filled 2023-04-25 (×2): qty 30, 30d supply, fill #0
  Filled 2023-05-20: qty 30, 30d supply, fill #1

## 2023-04-25 MED ORDER — ABIRATERONE ACETATE 250 MG PO TABS
1000.0000 mg | ORAL_TABLET | Freq: Every day | ORAL | 1 refills | Status: DC
Start: 2023-04-25 — End: 2023-06-09
  Filled 2023-04-25 (×2): qty 120, 30d supply, fill #0
  Filled 2023-05-20: qty 120, 30d supply, fill #1

## 2023-04-25 NOTE — Telephone Encounter (Signed)
Due to high darolutamide copay and patient not qualifying for assistance, patient will be switch to abiraterone which is available for a lower cost.

## 2023-04-25 NOTE — Telephone Encounter (Signed)
Patient successfully OnBoarded and drug education provided by pharmacist. Medication scheduled to be picked up by the patient on 04/28/23 from [facility] Patient also knows to call me at [phone] with any questions or concerns regarding receiving medication or if there is any unexpected change in co-pay.    Ardeen Fillers, CPhT Oncology Pharmacy Patient Advocate  [facility]  [phone] (phone) [phone] (fax) 04/25/2023 11:33 AM

## 2023-04-25 NOTE — Telephone Encounter (Signed)
Per Leeanne Mannan, RPH, pt will have medication on 04/28/23. Please schedule pt for follow up approx 2 weeks after 8/26. Please inform of appt details:   Lab/MD/ Alyson (cbc,cmp)

## 2023-04-25 NOTE — Telephone Encounter (Signed)
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  I plan to start Darolutamide 600 mg twice daily for now. Would you please help to check approval and arrange him to start once he gets the supply.   Yu team,  I plan to see him 2 weeks after he starts, lab MD cbc cmp thanks.    Thank you all.   Janyth Contes

## 2023-04-25 NOTE — Telephone Encounter (Signed)
Oral Oncology Patient Advocate Encounter  Prior Authorization for Abiraterone has been approved.    PA# [id number]  Effective dates: 09/02/22 through 09/02/23  Patients co-pay is $1,196.59.  [facility] would be $140.00.   [employer] [facility] price would be ~$90 plus shipping.   Ardeen Fillers, CPhT Oncology Pharmacy Patient Advocate  [facility]  [phone] (phone) [phone] (fax) 04/25/2023 8:26 AM

## 2023-04-25 NOTE — Telephone Encounter (Signed)
Clinical Pharmacist Practitioner Encounter   Patient will pick up his medication from [facility] (Specialty) on 04/28/23.  Patient Education I spoke with patient for overview of new oral chemotherapy medication: Zytiga (abiraterone) for the treatment of metastatic castration sensitive prostate cancer in conjunction with prednisone and ADT, planned duration until disease progression or unacceptable drug toxicity. MD plans to add on docetaxel in the future.   Counseled patient on administration, dosing, side effects, monitoring, drug-food interactions, safe handling, storage, and disposal. Patient will take: Abiraterone: Take 4 tablets (1,000 mg total) by mouth daily. Take on an empty stomach 1 hour before or 2 hours after a meal  Prednisone: Take 1 tablet (5 mg total) by mouth daily with breakfast.   Side effects include but not limited to: fatigue, hypertension, edema, and decreased wbc.    Reviewed with patient importance of keeping a medication schedule and plan for any missed doses.  After discussion with patient no patient barriers to medication adherence identified.   Dr. Ladona Ridgel voiced understanding and appreciation. All questions answered. Medication handout provided.  Provided patient with Oral Chemotherapy Navigation Clinic phone number. Patient knows to call the office with questions or concerns. Oral Chemotherapy Navigation Clinic will continue to follow.  Remi Haggard, PharmD, BCPS, BCOP, CPP Hematology/Oncology Clinical Pharmacist Practitioner [place] [phone]  04/25/2023 11:10 AM

## 2023-04-25 NOTE — Telephone Encounter (Signed)
Oral Oncology Patient Advocate Encounter  New authorization   Received notification that prior authorization for Abiraterone is required.   PA submitted on 04/25/23  Key [id number]  Status is pending     Ardeen Fillers, CPhT Oncology Pharmacy Patient Advocate  [facility]  [phone] (phone) [phone] (fax) 04/25/2023 8:17 AM

## 2023-04-25 NOTE — Telephone Encounter (Signed)
MD is changing medication from Nubeqa to Abiraterone. See new encounter.    Ardeen Fillers, CPhT Oncology Pharmacy Patient Advocate  [facility]  [phone] (phone) [phone] (fax) 04/25/2023 8:11 AM

## 2023-04-25 NOTE — Telephone Encounter (Signed)
Clinical Pharmacist Practitioner Encounter   Received new prescription for Zytiga (abiraterone) for the treatment of metastatic castration sensitive prostate cancer in conjunction with prednisone and ADT, planned duration until disease progression or unacceptable drug toxicity. MD plans to add on docetaxel in the future.  CMP from 04/21/23 assessed, no relevant lab abnormalities. Prescription dose and frequency assessed.   Current medication list in Epic reviewed, one DDIs with abiraterone identified: Tamsulosin: abiraterone may increase the serum concentration of tamsulosin. Monitor for increased tamsulosin effects (eg, hypotension, orthostasis). No baseline dose adjustment needed  Evaluated chart and no patient barriers to medication adherence identified.   Prescription has been e-scribed to the [facility] for benefits analysis and approval.  Oral Oncology Clinic will continue to follow for insurance authorization, copayment issues, initial counseling and start date.   Remi Haggard, PharmD, BCPS, BCOP, CPP Hematology/Oncology Clinical Pharmacist Practitioner [place] [phone]  04/25/2023 10:14 AM

## 2023-04-29 ENCOUNTER — Encounter: Payer: Self-pay | Admitting: Oncology

## 2023-05-12 ENCOUNTER — Inpatient Hospital Stay: Payer: Medicare HMO | Admitting: Oncology

## 2023-05-12 ENCOUNTER — Inpatient Hospital Stay: Payer: Medicare HMO | Admitting: Pharmacist

## 2023-05-12 ENCOUNTER — Inpatient Hospital Stay: Payer: Medicare HMO | Attending: Oncology

## 2023-05-12 ENCOUNTER — Encounter: Payer: Self-pay | Admitting: Oncology

## 2023-05-12 VITALS — BP 132/70 | HR 57 | Temp 96.2°F | Resp 18 | Wt 179.6 lb

## 2023-05-12 DIAGNOSIS — Z5111 Encounter for antineoplastic chemotherapy: Secondary | ICD-10-CM | POA: Diagnosis not present

## 2023-05-12 DIAGNOSIS — C7951 Secondary malignant neoplasm of bone: Secondary | ICD-10-CM | POA: Insufficient documentation

## 2023-05-12 DIAGNOSIS — K219 Gastro-esophageal reflux disease without esophagitis: Secondary | ICD-10-CM

## 2023-05-12 DIAGNOSIS — C61 Malignant neoplasm of prostate: Secondary | ICD-10-CM

## 2023-05-12 DIAGNOSIS — Z79818 Long term (current) use of other agents affecting estrogen receptors and estrogen levels: Secondary | ICD-10-CM | POA: Diagnosis not present

## 2023-05-12 DIAGNOSIS — Z79899 Other long term (current) drug therapy: Secondary | ICD-10-CM | POA: Diagnosis not present

## 2023-05-12 DIAGNOSIS — IMO0001 Reserved for inherently not codable concepts without codable children: Secondary | ICD-10-CM

## 2023-05-12 LAB — CBC WITH DIFFERENTIAL (CANCER CENTER ONLY)
Abs Immature Granulocytes: 0.03 10*3/uL (ref 0.00–0.07)
Basophils Absolute: 0 10*3/uL (ref 0.0–0.1)
Basophils Relative: 1 %
Eosinophils Absolute: 0.1 10*3/uL (ref 0.0–0.5)
Eosinophils Relative: 1 %
HCT: 41.7 % (ref 39.0–52.0)
Hemoglobin: 13.4 g/dL (ref 13.0–17.0)
Immature Granulocytes: 0 %
Lymphocytes Relative: 30 %
Lymphs Abs: 2.4 10*3/uL (ref 0.7–4.0)
MCH: 27.3 pg (ref 26.0–34.0)
MCHC: 32.1 g/dL (ref 30.0–36.0)
MCV: 85.1 fL (ref 80.0–100.0)
Monocytes Absolute: 0.6 10*3/uL (ref 0.1–1.0)
Monocytes Relative: 8 %
Neutro Abs: 4.8 10*3/uL (ref 1.7–7.7)
Neutrophils Relative %: 60 %
Platelet Count: 208 10*3/uL (ref 150–400)
RBC: 4.9 MIL/uL (ref 4.22–5.81)
RDW: 13.8 % (ref 11.5–15.5)
WBC Count: 8 10*3/uL (ref 4.0–10.5)
nRBC: 0 % (ref 0.0–0.2)

## 2023-05-12 LAB — CMP (CANCER CENTER ONLY)
ALT: 22 U/L (ref 0–44)
AST: 24 U/L (ref 15–41)
Albumin: 4.3 g/dL (ref 3.5–5.0)
Alkaline Phosphatase: 43 U/L (ref 38–126)
Anion gap: 6 (ref 5–15)
BUN: 21 mg/dL (ref 8–23)
CO2: 27 mmol/L (ref 22–32)
Calcium: 9.3 mg/dL (ref 8.9–10.3)
Chloride: 105 mmol/L (ref 98–111)
Creatinine: 1.11 mg/dL (ref 0.61–1.24)
GFR, Estimated: >60 mL/min (ref >60)
Glucose, Bld: 99 mg/dL (ref 70–99)
Potassium: 4.2 mmol/L (ref 3.5–5.1)
Sodium: 138 mmol/L (ref 135–145)
Total Bilirubin: 1.1 mg/dL (ref 0.3–1.2)
Total Protein: 7.1 g/dL (ref 6.5–8.1)

## 2023-05-12 NOTE — Assessment & Plan Note (Addendum)
Stage IV metastatic prostate cancer.  Check molecular testing NGS He tolerates Abiaterone 1000mg  daily with prednisone 5mg  daily.  Continue current regimen.

## 2023-05-12 NOTE — Assessment & Plan Note (Signed)
Recommend patient to start omeprazole 20mg  daily.- OTC supply

## 2023-05-12 NOTE — Progress Notes (Signed)
Clinical Pharmacist Practitioner Clinic [facility]  [phone] [phone]  Patient Care Team: McDiarmid, Leighton Roach, MD as PCP - General (Family Medicine) Rickard Patience, MD as Consulting Physician (Oncology)   Name of the patient: Christopher Wall  [id number]  1946/08/01   Date of visit: 05/12/23  HPI: Patient is a 77 y.o. male with metastatic castration sensitive prostate cancer diagnosed 03/2023. Planned treatment with abiraterone, prednisone, and ADT. MD plans to add on the docetaxel in the future (see MD note for more detail). Patient started abiraterone on 04/28/23.   Reason for Consult: Oral chemotherapy follow-up for Zytiga (abiraterone) therapy.   PAST MEDICAL HISTORY: Past Medical History:  Diagnosis Date   Abdominal wall hernia 03/20/2018   ATTENTION DEFICIT, W/O HYPERACTIVITY 10/30/2006   Qualifier: History of  By: McDiarmid MD, Tawanna Cooler     Cataract    bilateral cataracts with lens implant   Chest pain, atypical 12/05/2017   COLON POLYP 02/07/2014   Tubular Adenoma x 1 - Dr Christella Hartigan (GI)   Diverticulosis of colon 07/09/2018   Based on colonoscopy 06/2108   Encounter for neuropsychological testing 06/26/2015   [facility]: Only B/L avg in rote memory auditory info encoding. No mood dysfnc   Encounter for neuropsychological testing 2008   [facility] (Neuropsy): Mixed anxiety and depression o/w normal.    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  Repeat testing 06/2015 at [facility]: Only finding was below average in rote memory auditory info encoding. No mood dysfunction.    Obesity (BMI 30.0-34.9) 10/30/2006   Qualifier: History of   By: McDiarmid MD, Todd       Overweight (BMI 25.0-29.9) 10/30/2006   Qualifier: History of  By: McDiarmid MD, Todd     Prediabetes 12/21/2013   Pulmonary nodule, left 06/09/2015   Pure hypercholesterolemia 10/30/2006   ACC calculation 10-yr risk of CV event of 9.5% with recommendation to consider Statin therapy of moderate-to-high potency (12/21/13)     Rotator cuff tear 12/08/2020   Shoulder injury, right, initial encounter 11/02/2020    HEMATOLOGY/ONCOLOGY HISTORY:  Oncology History  Prostate cancer (HCC)  02/21/2023 Imaging   CT angiogram chest aorta with contrast showed 1. No acute intrathoracic abnormality. 2. 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 Continue annual imaging followup by CTA or MRA. This recommendation follows 2010 ACCF/AHA/AATS/ACR/ASA/SCA/SCAI/SIR/STS/SVM Guidelines for the Diagnosis and Management of Patients with Thoracic Aortic Disease. Circulation. 2010; 121: A213-Y865. Aortic aneurysm NOS (ICD10-I71.9) 3. Severe burden multivessel coronary atherosclerosis, greatest within the LAD. 4. Enlarged RIGHT hilar lymph nodes, suspicious. RIGHT posterior apical scarring without a discrete nodule. No suspicious mass within the RIGHT lung.   03/11/2023 Initial Diagnosis   Prostate cancer [facility])  April 2024, patient's PSA was elevated 167. Patient saw urologist.  Status post prostate biopsy.  Results were not available in epic.  Per patient he was diagnosed with prostate cancer.   03/14/2023 Imaging   PSMA PET scan showed 1. Intense radiotracer activity involving the  near entirety of the RIGHT lobe of the prostate gland with extension to the LEFT lobe consistent with primary prostate adenocarcinoma. 2. Intense radiotracer  activity extends into the RIGHT seminal vesicle consistent with nodal metastasis. 3. Intense radiotracer avid lymph nodes in the pelvis consistent with nodal metastasis. 4. Intense radiotracer avid metastatic adenopathy in the RIGHT lower paratracheal space consistent with mediastinal nodal metastasis. 5. Intense radiotracer avid metastatic disease to the skeleton with involvement of the thoracic spine, iliac bones, and RIGHT acromion process. 6. Nodular implant beneath the RIGHT diaphragm at the level of the upper pole of the RIGHT kidney consistent with metastatic implant.   03/21/2023 Cancer Staging   Staging form: Prostate, AJCC 8th Edition - Clinical stage from 03/21/2023: Stage IVA (cT3b, cN1, cM0, PSA: 167) - Signed by Rickard Patience, MD on 03/21/2023 Stage prefix: Initial diagnosis Prostate specific antigen (PSA) range: 20 or greater   03/21/2023 Tumor Marker   PSA 329   04/21/2023 Tumor Marker   PSA 7.84     ALLERGIES:  is allergic to cephalosporins.  MEDICATIONS:  Current Outpatient Medications  Medication Sig Dispense Refill   abiraterone acetate (ZYTIGA) 250 MG tablet Take 4 tablets (1,000 mg total) by mouth daily. Take on an empty stomach 1 hour before or 2 hours after a meal 120 tablet 1   lisinopril-hydrochlorothiazide (ZESTORETIC) 10-12.5 MG tablet Take 1 tablet by mouth daily. 90 tablet 3   predniSONE (DELTASONE) 5 MG tablet Take 1 tablet (5 mg total) by mouth daily with breakfast. 30 tablet 1   tadalafil (CIALIS) 5 MG tablet TAKE 1 TABLET BY MOUTH ONCE DAILY AS NEEDED 90 tablet 3   tamsulosin (FLOMAX) 0.4 MG CAPS capsule Take 1 capsule (0.4 mg total) by mouth daily. 30 capsule 3   Vibegron (GEMTESA) 75 MG TABS Take 1 tablet (75 mg total) by mouth daily. (Patient not taking: Reported on 04/21/2023)     No current facility-administered medications for this visit.    VITAL SIGNS: There were no vitals taken for this visit. There were no vitals filed for this visit.   Estimated body mass index is 26.52 kg/m as calculated from the following:   Height as of 04/18/23: 5\' 9"  (1.753 m).   Weight as of an earlier encounter on 05/12/23: 81.5 kg (179 lb 9.6 oz).  LABS: CBC:    Component Value Date/Time   WBC 8.0 05/12/2023 0919   WBC 7.6 02/18/2014 0953   HGB 13.4 05/12/2023 0919   HGB 15.2 11/29/2021 1108   HCT 41.7 05/12/2023 0919   HCT 44.4 11/29/2021 1108   PLT 208 05/12/2023 0919   PLT 222 11/29/2021 1108   MCV 85.1 05/12/2023 0919   MCV 80 11/29/2021 1108   NEUTROABS 4.8 05/12/2023 0919   NEUTROABS 4.0 11/29/2021 1108   LYMPHSABS 2.4 05/12/2023 0919   LYMPHSABS 2.8 11/29/2021 1108   MONOABS 0.6 05/12/2023 0919   EOSABS 0.1 05/12/2023 0919   EOSABS 0.1 11/29/2021 1108   BASOSABS 0.0 05/12/2023 0919   BASOSABS 0.0 11/29/2021 1108   Comprehensive Metabolic Panel:    Component Value Date/Time   NA 138 05/12/2023 0919   NA 139 12/26/2022 1740   K 4.2 05/12/2023 0919   CL 105 05/12/2023 0919   CO2 27 05/12/2023 0919   BUN 21 05/12/2023 0919   BUN 31 (H) 12/26/2022 1740   CREATININE 1.11 05/12/2023 0919   CREATININE 0.99 06/08/2015 1229   GLUCOSE 99 05/12/2023 0919   CALCIUM 9.3 05/12/2023 0919  AST 24 05/12/2023 0919   ALT 22 05/12/2023 0919   ALKPHOS 43 05/12/2023 0919   BILITOT 1.1 05/12/2023 0919   PROT 7.1 05/12/2023 0919   ALBUMIN 4.3 05/12/2023 0919     Present during today's visit: Patient and patient's Wife  Assessment and Plan: Patient reported overall they are felling pretty good while taking abiraterone . They reported some minor side effects report below. Recommended life-style changes as outlined below to help manage these reported side effects. They are taking their prednisone in the morning without issue BP today was 132/70 and patient did not report any increases in headaches. No edema or fluid accumulation reported in the lower extremities.  CMP/CBC reviewed, continue abiraterone 1000mg /d.   Oral Chemotherapy  Side Effect:  Patient reported feeling significant fatigue while taking their abiraterone in the morning. Now they are taking their abiraterone at 1400 and reports that this is working well to manage day time fatigue.  Patient also reports they're experiencing acid reflex around the time they take their abiraterone. In the afternoons and evening, they are also drinking 1 - 2 cups of coffee. Advised the patient to reduce their coffee intake in the afternoons and evening to help with acid reflex Advised the patient to try to take the medication right before bed to reduce fatigue as well as possibly the acid reflex symptoms If these lifestyle modifications don't work, consider adding Pepcid daily or as needed  Of note:  Patient reported some annoying lower back pain, but has a history of strenuous activity.Will continue to monitor these symptoms. They are taking Ibuprofen to manage the pain.    Oral Chemotherapy Adherence: Zytiga (abiraterone)  No patient barriers to medication adherence identified.   New medications: None  Medication Access Issues: None Identified. Patient is getting their medication delivered from [facility].   Patient expressed understanding and was in agreement with this plan. He also understands that he can call clinic at any time with any questions, concerns, or complaints.   Follow-up plan: RTC as scheduled  Thank you for allowing me to participate in the care of this very pleasant patient.   Time Total: 15 minutes  Visit consisted of counseling and education on dealing with issues of symptom management in the setting of serious and potentially life-threatening illness.Greater than 50%  of this time was spent counseling and coordinating care related to the above assessment and plan.  Signed by: Remi Haggard, PharmD, BCPS, Nolon Bussing, CPP Hematology/Oncology Clinical Pharmacist Practitioner [place] [phone]  05/12/2023 10:29  AM

## 2023-05-12 NOTE — Assessment & Plan Note (Signed)
Reviewed PET scan images with patient.  Consider repeat PMSA PET in the future.  Avoid heavy weight lifting.  Could consider palliative RT if back pain persists despite treatment.

## 2023-05-12 NOTE — Assessment & Plan Note (Signed)
Will switch to Eligard 45mg  Q6 months on 05/19/2023

## 2023-05-12 NOTE — Progress Notes (Signed)
Hematology/Oncology Progress note [phone] [phone]           REFERRING PROVIDER: McDiarmid, Leighton Roach, MD   CHIEF COMPLAINTS/REASON FOR VISIT:  Metastatic prostate cancer   ASSESSMENT & PLAN:   Cancer Staging  Prostate cancer [facility]) Staging form: Prostate, AJCC 8th Edition - Clinical stage from 03/21/2023: Stage IVA (cT3b, cN1, cM0, PSA: 167) - Signed by Rickard Patience, MD on 03/21/2023   Prostate cancer (HCC) Stage IV metastatic prostate cancer.  Check molecular testing NGS He tolerates Abiaterone 1000mg  daily with prednisone 5mg  daily.  Continue current regimen.    Androgen deprivation therapy Will switch to Eligard 45mg  Q6 months on 05/19/2023  GASTROESOPHAGEAL REFLUX, NO ESOPHAGITIS Recommend patient to start omeprazole 20mg  daily.- OTC supply  Metastasis to bone [facility]) Reviewed PET scan images with patient.  Consider repeat PMSA PET in the future.  Avoid heavy weight lifting.  Could consider palliative RT if back pain persists despite treatment.     Orders Placed This Encounter  Procedures   CMP (Cancer Center only)    Standing Status:   Future    Standing Expiration Date:   05/11/2024   CBC with Differential (Cancer Center Only)    Standing Status:   Future    Standing Expiration Date:   05/11/2024   PSA    Standing Status:   Future    Standing Expiration Date:   05/11/2024   Follow-up 3-4 weeks. All questions were answered. The patient knows to call the clinic with any problems, questions or concerns.  Rickard Patience, MD, PhD [facility] Hematology Oncology 05/12/2023   HISTORY OF PRESENTING ILLNESS:   Christopher Wall, Christopher Wall is a  77 y.o.  male with PMH listed below was seen in consultation at the request of  McDiarmid, Leighton Roach, MD  for evaluation of prostate cancer.  Oncology History  Prostate cancer (HCC)  02/21/2023 Imaging   CT angiogram chest aorta with contrast showed 1. No acute intrathoracic abnormality. 2. 4.0 cm fusiform dilatation  of the ascending thoracic aorta. Continue annual imaging followup by CTA or MRA. This recommendation follows 2010 ACCF/AHA/AATS/ACR/ASA/SCA/SCAI/SIR/STS/SVM Guidelines for the Diagnosis and Management of Patients with Thoracic Aortic Disease. Circulation. 2010; 121: X914-N829. Aortic aneurysm NOS (ICD10-I71.9) 3. Severe burden multivessel coronary atherosclerosis, greatest within the LAD. 4. Enlarged RIGHT hilar lymph nodes, suspicious. RIGHT posterior apical scarring without a discrete nodule. No suspicious mass within the RIGHT lung.   03/11/2023 Initial Diagnosis   Prostate cancer [facility])  April 2024, patient's PSA was elevated 167. Patient saw urologist.  Status post prostate biopsy.  Results were not available in epic.  Per patient he was diagnosed with prostate cancer.   03/14/2023 Imaging   PSMA PET scan showed 1. Intense radiotracer activity involving the near entirety of the RIGHT lobe of the prostate gland with extension to the LEFT lobe consistent with primary prostate adenocarcinoma. 2. Intense radiotracer activity extends into the RIGHT seminal vesicle consistent with nodal metastasis. 3. Intense radiotracer avid lymph nodes in the pelvis consistent with nodal metastasis. 4. Intense radiotracer avid metastatic adenopathy in the RIGHT lower paratracheal space consistent with mediastinal nodal metastasis. 5. Intense radiotracer avid metastatic disease to the skeleton with involvement of the thoracic spine, iliac bones, and RIGHT acromion process. 6. Nodular implant beneath the RIGHT diaphragm at the level of the upper pole of the RIGHT kidney consistent with metastatic implant.   03/21/2023 Cancer Staging   Staging form: Prostate, AJCC 8th Edition - Clinical stage from 03/21/2023:  Stage IVA (cT3b, cN1, cM0, PSA: 167) - Signed by Rickard Patience, MD on 03/21/2023 Stage prefix: Initial diagnosis Prostate specific antigen (PSA) range: 20 or greater   03/21/2023 Tumor Marker   PSA  329   04/21/2023 Tumor Marker   PSA 7.84   04/25/2023 -  Chemotherapy   Patient started on Abiaterone 1000mg  daily and Prednisone 5mg  daily.      He has established care with Urology, and has started on Flomax. Urinary urgency has improved.  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  Today he reports feeling well.  Due to high darolutamide copay and patient not qualifying for assistance, plan was changed to Abiaterone 1000mg  daily and Prednisone 5mg  daily and patient has started 2 weeks ago.  Overall he tolerates well.  + acid reflux for a few days.  + chronic lower back pain, slightly better.   MEDICAL HISTORY:  Past Medical History:  Diagnosis Date   Abdominal wall hernia 03/20/2018   ATTENTION DEFICIT, W/O HYPERACTIVITY 10/30/2006   Qualifier: History of  By: McDiarmid MD, Tawanna Cooler     Cataract    bilateral cataracts with lens implant   Chest pain, atypical 12/05/2017   COLON POLYP 02/07/2014   Tubular Adenoma x 1 - Dr Christella Hartigan (GI)   Diverticulosis of colon 07/09/2018   Based on colonoscopy 06/2108   Encounter for neuropsychological testing 06/26/2015   [facility]: Only B/L avg in rote memory auditory info encoding. No mood dysfnc   Encounter for neuropsychological testing 2008   [facility]bert[facility] Grates (Neuropsy): Mixed anxiety and depression o/w normal.    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  Repeat testing 06/2015 at [facility]  Neuropsychology: Only finding was below average in rote memory auditory info encoding. No mood dysfunction.    Obesity (BMI 30.0-34.9) 10/30/2006   Qualifier: History of   By: McDiarmid MD, Todd       Overweight (BMI 25.0-29.9) 10/30/2006   Qualifier: History of  By: McDiarmid MD, Todd     Prediabetes 12/21/2013   Pulmonary nodule, left 06/09/2015   Pure hypercholesterolemia 10/30/2006   ACC calculation 10-yr risk of CV event of 9.5% with recommendation to consider Statin therapy of moderate-to-high potency (12/21/13)     Rotator cuff tear 12/08/2020   Shoulder injury, right, initial encounter 11/02/2020    SURGICAL HISTORY: Past Surgical History:  Procedure Laterality Date   CATARACT EXTRACTION W/ INTRAOCULAR LENS  IMPLANT, BILATERAL     COCCYGECTOMY  1985   VASECTOMY      SOCIAL HISTORY: Social History   Socioeconomic History   Marital status: Married    Spouse name: Darl Pikes   Number of children: 4   Years of education: >20   Highest education level: Not on file  Occupational History   Occupation: Education officer, community   Occupation: Pharmacist  Tobacco Use   Smoking status: Never   Smokeless tobacco: Never  Vaping Use   Vaping status: Never Used  Substance and Sexual  Activity   Alcohol use: Yes    Alcohol/week: 3.0 standard drinks of alcohol    Types: 3 drink(s) per week   Drug use: No   Sexual activity: Yes    Comment: monogamous  Other Topics Concern   Not on file  Social History Narrative   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   4 adult sons ([place], [place], [place]): 3 biological and 1 step-child   Patient is third of six children   No formal HCPOA executed (12/21/13)   No Advanced Directive executed (12/21/13)   He and wife are raising a granddaughter (2016)   Hobbies: Motorcycling and Horse riding.         Social Determinants of Health   Financial Resource Strain: Not on file  Food Insecurity: No Food Insecurity (03/21/2023)   Hunger Vital Sign    Worried  About Running Out of Food in the Last Year: Never true    Ran Out of Food in the Last Year: Never true  Transportation Needs: No Transportation Needs (03/21/2023)   PRAPARE - Administrator, Civil Service (Medical): No    Lack of Transportation (Non-Medical): No  Physical Activity: Not on file  Stress: Not on file  Social Connections: Not on file  Intimate Partner Violence: Not At Risk (03/21/2023)   Humiliation, Afraid, Rape, and Kick questionnaire    Fear of Current or Ex-Partner: No    Emotionally Abused: No    Physically Abused: No    Sexually Abused: No    FAMILY HISTORY: Family History  Problem Relation Age of Onset   Hypertension Mother    Stroke Mother 29   Dementia Mother 74   Diabetes Father    Diabetes Sister    Diabetes Brother    Esophageal cancer Brother    Lymphoma Brother    Esophageal cancer Brother    Lymphoma Brother    Diabetes Brother    Colon cancer Neg Hx     ALLERGIES:  is allergic to cephalosporins.  MEDICATIONS:  Current Outpatient Medications  Medication Sig Dispense Refill   abiraterone acetate (ZYTIGA) 250 MG tablet Take 4 tablets (1,000 mg total) by mouth daily. Take on an empty stomach 1 hour before or 2 hours after a meal 120 tablet 1   lisinopril-hydrochlorothiazide (ZESTORETIC) 10-12.5 MG tablet Take 1 tablet by mouth daily. 90 tablet 3   predniSONE (DELTASONE) 5 MG tablet Take 1 tablet (5 mg total) by mouth daily with breakfast. 30 tablet 1   tadalafil (CIALIS) 5 MG tablet TAKE 1 TABLET BY MOUTH ONCE DAILY AS NEEDED 90 tablet 3   tamsulosin (FLOMAX) 0.4 MG CAPS capsule Take 1 capsule (0.4 mg total) by mouth daily. 30 capsule 3   Vibegron (GEMTESA) 75 MG TABS Take 1 tablet (75 mg total) by mouth daily. (Patient not taking: Reported on 04/21/2023)     No current facility-administered medications for this visit.    Review of Systems  Constitutional:  Negative for appetite change, chills, fatigue, fever and unexpected weight  change.  HENT:   Negative for hearing loss and voice change.   Eyes:  Negative for eye problems and icterus.  Respiratory:  Negative for chest tightness, cough and shortness of breath.   Cardiovascular:  Negative for chest pain and leg swelling.  Gastrointestinal:  Negative for abdominal distention and abdominal pain.  Endocrine: Negative for hot flashes.  Genitourinary:  Negative for difficulty urinating, dysuria and frequency.   Musculoskeletal:  Positive  for arthralgias and back pain.  Skin:  Negative for itching and rash.  Neurological:  Negative for light-headedness and numbness.  Hematological:  Negative for adenopathy. Does not bruise/bleed easily.  Psychiatric/Behavioral:  Negative for confusion.    PHYSICAL EXAMINATION: ECOG PERFORMANCE STATUS: 0 - Asymptomatic Vitals:   05/12/23 0933  BP: 132/70  Pulse: (!) 57  Resp: 18  Temp: (!) 96.2 F (35.7 C)  SpO2: 100%   Filed Weights   05/12/23 0933  Weight: 179 lb 9.6 oz (81.5 kg)    Physical Exam Constitutional:      General: He is not in acute distress. HENT:     Head: Normocephalic and atraumatic.  Eyes:     General: No scleral icterus. Cardiovascular:     Rate and Rhythm: Normal rate and regular rhythm.  Pulmonary:     Effort: Pulmonary effort is normal. No respiratory distress.  Abdominal:     General: There is no distension.     Palpations: Abdomen is soft.  Musculoskeletal:        General: No deformity. Normal range of motion.     Cervical back: Normal range of motion and neck supple.  Skin:    Findings: No erythema or rash.  Neurological:     Mental Status: He is alert and oriented to person, place, and time. Mental status is at baseline.  Psychiatric:        Mood and Affect: Mood normal.     LABORATORY DATA:  I have reviewed the data as listed    Latest Ref Rng & Units 05/12/2023    9:19 AM 04/21/2023   11:53 AM 03/21/2023   12:17 PM  CBC  WBC 4.0 - 10.5 K/uL 8.0  6.9  7.9   Hemoglobin 13.0 -  17.0 g/dL 16.1  09.6  04.5   Hematocrit 39.0 - 52.0 % 41.7  41.7  45.6   Platelets 150 - 400 K/uL 208  206  214       Latest Ref Rng & Units 05/12/2023    9:19 AM 04/21/2023   11:52 AM 03/21/2023   12:17 PM  CMP  Glucose 70 - 99 mg/dL 99  98  409   BUN 8 - 23 mg/dL 21  28  20    Creatinine 0.61 - 1.24 mg/dL 8.11  9.14  7.82   Sodium 135 - 145 mmol/L 138  136  137   Potassium 3.5 - 5.1 mmol/L 4.2  4.2  4.4   Chloride 98 - 111 mmol/L 105  104  101   CO2 22 - 32 mmol/L 27  25  28    Calcium 8.9 - 10.3 mg/dL 9.3  9.4  9.6   Total Protein 6.5 - 8.1 g/dL 7.1  7.5  7.7   Total Bilirubin 0.3 - 1.2 mg/dL 1.1  0.5  0.8   Alkaline Phos 38 - 126 U/L 43  40  42   AST 15 - 41 U/L 24  23  24    ALT 0 - 44 U/L 22  22  19        RADIOGRAPHIC STUDIES: I have personally reviewed the radiological images as listed and agreed with the findings in the report. NM PET (PSMA) SKULL TO MID THIGH  Result Date: 03/14/2023 CLINICAL DATA:  Elevated PSA equal 167. EXAM: NUCLEAR MEDICINE PET SKULL BASE TO THIGH TECHNIQUE: 9.7 mCi F18 Piflufolastat (Pylarify) was injected intravenously. Full-ring PET imaging was performed from the skull base to thigh after the radiotracer. CT data was  obtained and used for attenuation correction and anatomic localization. COMPARISON:  None Available. FINDINGS: NECK No radiotracer activity in neck lymph nodes. Incidental CT finding: None. CHEST Intense radiotracer avid RIGHT lower paratracheal nodes. Conglomerate of approximately 3 nodes measures 1.7 x 4.2 cm (image 101) with SUV max equal 22. No suspicious pulmonary nodules Nodular implant beneath the RIGHT diaphragm at the level of the upper pole of the RIGHT kidney measures 19 mm on image 143 with SUV max equal 15. Incidental CT finding: None. ABDOMEN/PELVIS Prostate: Intense radiotracer activity involves the near entirety of the RIGHT lobe of the prostate gland with extension to the LEFT lobe. Activity is intense with SUV max equal 19  (image 211). Abnormal radiotracer activity extends into the RIGHT seminal vesicle (image 200) Lymph nodes: Large intense radiotracer avid lymph node posterior RIGHT of the prostate gland adjacent to the rectum measures 14 mm (219) with SUV max equal 27 (image 209) Similar large lymph node in the presacral space measures 19 mm with SUV max equal 30 on image 196. Smaller intense radiotracer avid node in the presacral space on image 190 Liver: No evidence of liver metastasis. Incidental CT finding: None. SKELETON Intense radiotracer avid lesions in the LEFT and RIGHT posterior iliac bones associated sclerotic lesions. For example along the RIGHT with SUV max equal 8.9. Lesion in the midthoracic spine at T7 and and T10. Lesion in the RIGHT acromion process is intense IMPRESSION: 1. Intense radiotracer activity involving the near entirety of the RIGHT lobe of the prostate gland with extension to the LEFT lobe consistent with primary prostate adenocarcinoma. 2. Intense radiotracer activity extends into the RIGHT seminal vesicle consistent with nodal metastasis. 3. Intense radiotracer avid lymph nodes in the pelvis consistent with nodal metastasis. 4. Intense radiotracer avid metastatic adenopathy in the RIGHT lower paratracheal space consistent with mediastinal nodal metastasis. 5. Intense radiotracer avid metastatic disease to the skeleton with involvement of the thoracic spine, iliac bones, and RIGHT acromion process. 6. Nodular implant beneath the RIGHT diaphragm at the level of the upper pole of the RIGHT kidney consistent with metastatic implant. Electronically Signed   By: Genevive Bi M.D.   On: 03/14/2023 16:36   CT ANGIO CHEST AORTA W/CM & OR WO/CM  Result Date: 02/21/2023 CLINICAL DATA:  Tortuous Aorta on Chest X-ray, rule out aortic aneurysm EXAM: CT ANGIOGRAPHY CHEST WITH CONTRAST TECHNIQUE: Multidetector CT imaging of the chest was performed using the standard protocol during bolus administration of  intravenous contrast. Multiplanar CT image reconstructions and MIPs were obtained to evaluate the vascular anatomy. RADIATION DOSE REDUCTION: This exam was performed according to the departmental dose-optimization program which includes automated exposure control, adjustment of the mA and/or kV according to patient size and/or use of iterative reconstruction technique. CONTRAST:  81mL ISOVUE-300 IOPAMIDOL (ISOVUE-300) INJECTION 61% COMPARISON:  Chest XR, 01/06/2023.  CT chest, 05/02/2008. FINDINGS: CARDIOVASCULAR: Limitations by motion: Moderate Preferential opacification of the thoracic aorta. No evidence of thoracic aortic dissection. Aortic Root: Motion degraded, such that the aortic root can not be accurately measured. Thoracic Aorta: --Ascending Aorta: 4.0 cm --Aortic Arch: 2.8 cm --Descending Aorta: 2.6 cm Other: LEFT aortic arch and common "bovine" variant, with shared origin of the brachiocephalic and LEFT common carotid arteries. Normal heart size. No pericardial effusion. Severe multivessel coronary atherosclerosis, greatest burden within the LAD. No segmental or larger pulmonary embolus. Mediastinum/Nodes: Enlarged RIGHT hilar lymph nodes. See key image. No enlarged mediastinal or axillary lymph nodes. Thyroid gland, trachea, and esophagus demonstrate no  significant findings. Lungs/Pleura: LEFT basilar subpleural nodules, stable since 2009. Trace RIGHT posterior apical scarring without a discrete nodule. Lungs are otherwise clear without focal consolidation. No pleural effusion or pneumothorax. Upper Abdomen: No acute abnormality. Sub-1 cm hepatic hypodensities, too small to adequately characterize though likely small cysts versus hemangiomas. Inferior pole accessory spleen. Mild burden of aortic atherosclerosis. Musculoskeletal: No acute chest wall abnormality. No acute osseous findings. Review of the MIP images confirms the above findings. IMPRESSION: 1. No acute intrathoracic abnormality. 2. 4.0 cm  fusiform dilatation of the ascending thoracic aorta. Continue annual imaging followup by CTA or MRA. This recommendation follows 2010 ACCF/AHA/AATS/ACR/ASA/SCA/SCAI/SIR/STS/SVM Guidelines for the Diagnosis and Management of Patients with Thoracic Aortic Disease. Circulation. 2010; 121: Z610-R604. Aortic aneurysm NOS (ICD10-I71.9) 3. Severe burden multivessel coronary atherosclerosis, greatest within the LAD. 4. Enlarged RIGHT hilar lymph nodes, suspicious. RIGHT posterior apical scarring without a discrete nodule. No suspicious mass within the RIGHT lung. Consider follow-up CT chest in 3-6 months and Pulmonologist referral if continued clinical concern. These results will be called to the ordering clinician or representative by the Radiologist Assistant, and communication documented in the PACS or [employer]. Roanna Banning, MD [facility] Radiology Electronically Signed   By: Roanna Banning M.D.   On: 02/21/2023 15:18

## 2023-05-13 ENCOUNTER — Other Ambulatory Visit (HOSPITAL_COMMUNITY): Payer: Self-pay

## 2023-05-15 ENCOUNTER — Other Ambulatory Visit (HOSPITAL_COMMUNITY): Payer: Self-pay

## 2023-05-19 ENCOUNTER — Encounter: Payer: Self-pay | Admitting: Oncology

## 2023-05-19 ENCOUNTER — Encounter (HOSPITAL_COMMUNITY): Payer: Self-pay

## 2023-05-19 ENCOUNTER — Inpatient Hospital Stay: Payer: Medicare HMO

## 2023-05-19 ENCOUNTER — Other Ambulatory Visit (HOSPITAL_COMMUNITY): Payer: Self-pay

## 2023-05-19 DIAGNOSIS — C7951 Secondary malignant neoplasm of bone: Secondary | ICD-10-CM | POA: Diagnosis not present

## 2023-05-19 DIAGNOSIS — Z79899 Other long term (current) drug therapy: Secondary | ICD-10-CM | POA: Diagnosis not present

## 2023-05-19 DIAGNOSIS — Z5111 Encounter for antineoplastic chemotherapy: Secondary | ICD-10-CM | POA: Diagnosis not present

## 2023-05-19 DIAGNOSIS — K219 Gastro-esophageal reflux disease without esophagitis: Secondary | ICD-10-CM | POA: Diagnosis not present

## 2023-05-19 DIAGNOSIS — C61 Malignant neoplasm of prostate: Secondary | ICD-10-CM

## 2023-05-19 MED ORDER — LEUPROLIDE ACETATE (6 MONTH) 45 MG ~~LOC~~ KIT
45.0000 mg | PACK | Freq: Once | SUBCUTANEOUS | Status: AC
  Administered 2023-05-19: 45 mg via SUBCUTANEOUS
  Filled 2023-05-19: qty 45

## 2023-05-20 ENCOUNTER — Encounter: Payer: Self-pay | Admitting: Oncology

## 2023-05-20 ENCOUNTER — Other Ambulatory Visit (HOSPITAL_COMMUNITY): Payer: Self-pay

## 2023-05-23 ENCOUNTER — Other Ambulatory Visit (HOSPITAL_COMMUNITY): Payer: Self-pay

## 2023-05-23 ENCOUNTER — Encounter: Payer: Self-pay | Admitting: Oncology

## 2023-05-23 ENCOUNTER — Other Ambulatory Visit: Payer: Self-pay

## 2023-05-23 ENCOUNTER — Other Ambulatory Visit: Payer: Self-pay | Admitting: Urology

## 2023-05-23 MED ORDER — GEMTESA 75 MG PO TABS
75.0000 mg | ORAL_TABLET | Freq: Every day | ORAL | 3 refills | Status: DC
  Filled 2023-05-23: qty 30, 30d supply, fill #0
  Filled 2023-06-20: qty 30, 30d supply, fill #1
  Filled 2023-07-18: qty 30, 30d supply, fill #2

## 2023-05-26 ENCOUNTER — Other Ambulatory Visit (HOSPITAL_COMMUNITY): Payer: Self-pay

## 2023-06-09 ENCOUNTER — Inpatient Hospital Stay: Payer: Medicare HMO | Attending: Oncology

## 2023-06-09 ENCOUNTER — Inpatient Hospital Stay: Payer: Medicare HMO | Admitting: Oncology

## 2023-06-09 ENCOUNTER — Other Ambulatory Visit: Payer: Medicare HMO

## 2023-06-09 ENCOUNTER — Other Ambulatory Visit: Payer: Self-pay

## 2023-06-09 ENCOUNTER — Encounter: Payer: Self-pay | Admitting: Oncology

## 2023-06-09 ENCOUNTER — Other Ambulatory Visit (HOSPITAL_COMMUNITY): Payer: Self-pay

## 2023-06-09 ENCOUNTER — Ambulatory Visit: Payer: Medicare HMO | Admitting: Oncology

## 2023-06-09 VITALS — BP 144/74 | HR 66 | Temp 96.9°F | Resp 18 | Wt 180.7 lb

## 2023-06-09 DIAGNOSIS — C7951 Secondary malignant neoplasm of bone: Secondary | ICD-10-CM | POA: Insufficient documentation

## 2023-06-09 DIAGNOSIS — Z79818 Long term (current) use of other agents affecting estrogen receptors and estrogen levels: Secondary | ICD-10-CM | POA: Diagnosis not present

## 2023-06-09 DIAGNOSIS — Z5111 Encounter for antineoplastic chemotherapy: Secondary | ICD-10-CM | POA: Diagnosis not present

## 2023-06-09 DIAGNOSIS — Z7952 Long term (current) use of systemic steroids: Secondary | ICD-10-CM | POA: Diagnosis not present

## 2023-06-09 DIAGNOSIS — Z79899 Other long term (current) drug therapy: Secondary | ICD-10-CM | POA: Insufficient documentation

## 2023-06-09 DIAGNOSIS — C61 Malignant neoplasm of prostate: Secondary | ICD-10-CM | POA: Diagnosis not present

## 2023-06-09 LAB — CMP (CANCER CENTER ONLY)
ALT: 15 U/L (ref 0–44)
AST: 18 U/L (ref 15–41)
Albumin: 4 g/dL (ref 3.5–5.0)
Alkaline Phosphatase: 43 U/L (ref 38–126)
Anion gap: 6 (ref 5–15)
BUN: 25 mg/dL — ABNORMAL HIGH (ref 8–23)
CO2: 27 mmol/L (ref 22–32)
Calcium: 9.1 mg/dL (ref 8.9–10.3)
Chloride: 103 mmol/L (ref 98–111)
Creatinine: 0.94 mg/dL (ref 0.61–1.24)
GFR, Estimated: >60 mL/min (ref >60)
Glucose, Bld: 106 mg/dL — ABNORMAL HIGH (ref 70–99)
Potassium: 4.3 mmol/L (ref 3.5–5.1)
Sodium: 136 mmol/L (ref 135–145)
Total Bilirubin: 0.8 mg/dL (ref 0.3–1.2)
Total Protein: 7.1 g/dL (ref 6.5–8.1)

## 2023-06-09 LAB — CBC WITH DIFFERENTIAL (CANCER CENTER ONLY)
Abs Immature Granulocytes: 0.04 10*3/uL (ref 0.00–0.07)
Basophils Absolute: 0.1 10*3/uL (ref 0.0–0.1)
Basophils Relative: 1 %
Eosinophils Absolute: 0 10*3/uL (ref 0.0–0.5)
Eosinophils Relative: 0 %
HCT: 41.4 % (ref 39.0–52.0)
Hemoglobin: 13.5 g/dL (ref 13.0–17.0)
Immature Granulocytes: 1 %
Lymphocytes Relative: 20 %
Lymphs Abs: 1.4 10*3/uL (ref 0.7–4.0)
MCH: 27.7 pg (ref 26.0–34.0)
MCHC: 32.6 g/dL (ref 30.0–36.0)
MCV: 85 fL (ref 80.0–100.0)
Monocytes Absolute: 0.4 10*3/uL (ref 0.1–1.0)
Monocytes Relative: 6 %
Neutro Abs: 5.3 10*3/uL (ref 1.7–7.7)
Neutrophils Relative %: 72 %
Platelet Count: 202 10*3/uL (ref 150–400)
RBC: 4.87 MIL/uL (ref 4.22–5.81)
RDW: 13.2 % (ref 11.5–15.5)
WBC Count: 7.2 10*3/uL (ref 4.0–10.5)
nRBC: 0 % (ref 0.0–0.2)

## 2023-06-09 LAB — PSA: Prostatic Specific Antigen: 1.06 ng/mL (ref 0.00–4.00)

## 2023-06-09 MED ORDER — ABIRATERONE ACETATE 250 MG PO TABS
1000.0000 mg | ORAL_TABLET | Freq: Every day | ORAL | 1 refills | Status: DC
Start: 2023-06-09 — End: 2023-08-11
  Filled 2023-06-09 – 2023-06-20 (×3): qty 120, 30d supply, fill #0
  Filled 2023-07-18: qty 120, 30d supply, fill #1

## 2023-06-09 NOTE — Progress Notes (Signed)
Hematology/Oncology Progress note [phone] [phone]           REFERRING PROVIDER: McDiarmid, Leighton Roach, MD   CHIEF COMPLAINTS/REASON FOR VISIT:  Metastatic prostate cancer   ASSESSMENT & PLAN:   Cancer Staging  Prostate cancer [facility]) Staging form: Prostate, AJCC 8th Edition - Clinical stage from 03/21/2023: Stage IVA (cT3b, cN1, cM0, PSA: 167) - Signed by Rickard Patience, MD on 03/21/2023   Prostate cancer (HCC) Stage IV metastatic prostate cancer.  Check molecular testing NGS He tolerates Abiaterone 1000mg  daily with prednisone 5mg  daily.  Continue current regimen.  BP is trending slightly higher, likely due to Abiaterone, recommend to monitor at home, consider BP regimen adjustment if BP is persistently out of control.   GASTROESOPHAGEAL REFLUX, NO ESOPHAGITIS Recommend patient to start omeprazole 20mg  daily.- OTC supply  Metastasis to bone [facility]) Reviewed PET scan images with patient.  Consider repeat PMSA PET in the future.  Avoid heavy weight lifting.  Could consider palliative RT if back pain persists despite treatment.   Androgen deprivation therapy Eligard 45mg  Q6 months next due March 2025  Current chronic use of systemic steroids Check A1c    Orders Placed This Encounter  Procedures   Hemoglobin A1c    Standing Status:   Future    Standing Expiration Date:   06/08/2024   CBC with Differential (Cancer Center Only)    Standing Status:   Future    Standing Expiration Date:   06/08/2024   CMP (Cancer Center only)    Standing Status:   Future    Standing Expiration Date:   06/08/2024   PSA    Standing Status:   Future    Standing Expiration Date:   06/08/2024   Follow-up 2 months All questions were answered. The patient knows to call the clinic with any problems, questions or concerns.  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  Oncology History  Prostate cancer (HCC)  02/21/2023 Imaging   CT angiogram chest aorta with contrast showed 1. No acute intrathoracic abnormality. 2. 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 Continue annual imaging followup by CTA or MRA. This recommendation follows 2010 ACCF/AHA/AATS/ACR/ASA/SCA/SCAI/SIR/STS/SVM Guidelines for the Diagnosis and Management of Patients with Thoracic Aortic Disease. Circulation. 2010; 121: X914-N829. Aortic aneurysm NOS (ICD10-I71.9) 3. Severe burden multivessel coronary atherosclerosis, greatest within the LAD. 4. Enlarged RIGHT hilar lymph nodes, suspicious. RIGHT posterior apical scarring without a discrete nodule. No suspicious mass within the RIGHT lung.   03/11/2023 Initial Diagnosis   Prostate cancer [facility]  April 2024, patient's PSA was elevated 167. Patient saw urologist.  Status post prostate biopsy.  Results were not available in epic.  Per patient he was diagnosed with prostate cancer.   03/14/2023 Imaging   PSMA PET scan showed 1. Intense radiotracer activity involving the near entirety of the RIGHT lobe of the prostate gland with extension to the LEFT lobe consistent with primary prostate adenocarcinoma. 2. Intense radiotracer activity extends into the RIGHT seminal vesicle consistent with nodal metastasis. 3. Intense radiotracer avid lymph nodes in the pelvis consistent with nodal metastasis. 4. Intense radiotracer avid metastatic adenopathy in the RIGHT lower paratracheal space consistent with mediastinal nodal metastasis. 5. Intense radiotracer avid metastatic disease to  the skeleton with involvement of the thoracic spine, iliac bones, and RIGHT acromion process. 6. Nodular implant beneath the RIGHT diaphragm at the level of the upper pole of the RIGHT kidney consistent with metastatic implant.    03/21/2023 Cancer Staging   Staging form: Prostate, AJCC 8th Edition - Clinical stage from 03/21/2023: Stage IVA (cT3b, cN1, cM0, PSA: 167) - Signed by Rickard Patience, MD on 03/21/2023 Stage prefix: Initial diagnosis Prostate specific antigen (PSA) range: 20 or greater   03/21/2023 Tumor Marker   PSA 329   04/21/2023 Tumor Marker   PSA 7.84   04/25/2023 -  Chemotherapy   Patient started on Abiaterone 1000mg  daily and Prednisone 5mg  daily.      He has established care with Urology, and has started on Flomax. Urinary urgency has improved.  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  Today he reports feeling well.  Overall he tolerates Abiaterone and prednisone regimen + acid refluxIs better   MEDICAL HISTORY:  Past Medical History:  Diagnosis Date   Abdominal wall hernia 03/20/2018   ATTENTION DEFICIT, W/O HYPERACTIVITY 10/30/2006   Qualifier: History of  By: McDiarmid MD, Todd     Cataract    bilateral cataracts with lens implant   Chest pain, atypical 12/05/2017   COLON POLYP 02/07/2014   Tubular Adenoma x 1 - Dr Christella Hartigan (GI)   Diverticulosis of colon 07/09/2018   Based on colonoscopy 06/2108   Encounter for neuropsychological testing 06/26/2015   [facility]: Only B/L avg in rote memory auditory info encoding. No mood dysfnc   Encounter for neuropsychological testing 2008   [facility] (Neuropsy): Mixed anxiety and depression o/w normal.    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  Repeat testing 06/2015 at [facility]: Only finding was below average in rote memory auditory info encoding. No mood dysfunction.    Obesity (BMI 30.0-34.9) 10/30/2006   Qualifier: History of   By: McDiarmid MD, Todd       Overweight (BMI 25.0-29.9) 10/30/2006   Qualifier: History of  By: McDiarmid MD, Todd     Prediabetes 12/21/2013   Pulmonary nodule, left 06/09/2015   Pure hypercholesterolemia 10/30/2006   ACC calculation 10-yr risk of CV event of 9.5% with recommendation to consider Statin therapy of moderate-to-high potency (12/21/13)     Rotator cuff tear 12/08/2020   Shoulder injury, right, initial encounter 11/02/2020    SURGICAL HISTORY: Past Surgical History:  Procedure Laterality Date   CATARACT EXTRACTION W/ INTRAOCULAR LENS  IMPLANT, BILATERAL     COCCYGECTOMY  1985   VASECTOMY      SOCIAL HISTORY: Social History   Socioeconomic History   Marital status: Married    Spouse name: Darl Pikes   Number of children: 4   Years of education: >20   Highest education level: Not on file  Occupational History   Occupation: Education officer, community   Occupation: Pharmacist  Tobacco Use   Smoking status: Never   Smokeless  tobacco: Never  Vaping Use   Vaping status: Never Used  Substance and Sexual Activity   Alcohol use: Yes    Alcohol/week: 3.0 standard drinks of alcohol    Types: 3 drink(s) per week   Drug use: No   Sexual activity: Yes    Comment: monogamous  Other Topics Concern   Not on file  Social History Narrative   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   4 adult sons ([place], [place], [place]): 3 biological and 1 step-child   Patient is third of six children   No formal HCPOA executed (12/21/13)   No Advanced Directive executed (12/21/13)   He and wife are raising a granddaughter (2016)   Hobbies: Motorcycling and Horse riding.         Social Determinants of Health   Financial Resource Strain: Not  on file  Food Insecurity: No Food Insecurity (03/21/2023)   Hunger Vital Sign    Worried About Running Out of Food in the Last Year: Never true    Ran Out of Food in the Last Year: Never true  Transportation Needs: No Transportation Needs (03/21/2023)   PRAPARE - Administrator, Civil Service (Medical): No    Lack of Transportation (Non-Medical): No  Physical Activity: Not on file  Stress: Not on file  Social Connections: Not on file  Intimate Partner Violence: Not At Risk (03/21/2023)   Humiliation, Afraid, Rape, and Kick questionnaire    Fear of Current or Ex-Partner: No    Emotionally Abused: No    Physically Abused: No    Sexually Abused: No    FAMILY HISTORY: Family History  Problem Relation Age of Onset   Hypertension Mother    Stroke Mother 1   Dementia Mother 62   Diabetes Father    Diabetes Sister    Diabetes Brother    Esophageal cancer Brother    Lymphoma Brother    Esophageal cancer Brother    Lymphoma Brother    Diabetes Brother    Colon cancer Neg Hx     ALLERGIES:  is allergic to cephalosporins.  MEDICATIONS:  Current Outpatient Medications  Medication Sig Dispense Refill   abiraterone acetate (ZYTIGA) 250 MG tablet Take 4 tablets (1,000 mg total) by mouth daily. Take on an empty stomach 1 hour before or 2 hours after a meal 120 tablet 1   azithromycin (ZITHROMAX) 500 MG tablet Take 500 mg by mouth daily.     lisinopril-hydrochlorothiazide (ZESTORETIC) 10-12.5 MG tablet Take 1 tablet by mouth daily. 90 tablet 3   predniSONE (DELTASONE) 5 MG tablet Take 1 tablet (5 mg total) by mouth daily with breakfast. 30 tablet 1   tadalafil (CIALIS) 5 MG tablet TAKE 1 TABLET BY MOUTH ONCE DAILY AS NEEDED 90 tablet 3   tamsulosin (FLOMAX) 0.4 MG CAPS capsule Take 1 capsule (0.4 mg total) by mouth daily. 30 capsule 3   Vibegron (GEMTESA) 75 MG TABS Take 1 tablet (75 mg total) by mouth daily. 30 tablet 3   No current facility-administered medications for this  visit.    Review of Systems  Constitutional:  Negative for appetite change, chills, fatigue, fever and unexpected weight change.  HENT:   Negative for hearing loss and voice change.   Eyes:  Negative for eye problems and icterus.  Respiratory:  Negative for chest tightness, cough and shortness of breath.   Cardiovascular:  Negative for chest pain and leg swelling.  Gastrointestinal:  Negative for abdominal  distention and abdominal pain.  Endocrine: Negative for hot flashes.  Genitourinary:  Negative for difficulty urinating, dysuria and frequency.   Musculoskeletal:  Positive for arthralgias and back pain.  Skin:  Negative for itching and rash.  Neurological:  Negative for light-headedness and numbness.  Hematological:  Negative for adenopathy. Does not bruise/bleed easily.  Psychiatric/Behavioral:  Negative for confusion.    PHYSICAL EXAMINATION: ECOG PERFORMANCE STATUS: 0 - Asymptomatic Vitals:   06/09/23 1123  BP: (!) 144/74  Pulse: 66  Resp: 18  Temp: (!) 96.9 F (36.1 C)  SpO2: 97%   Filed Weights   06/09/23 1123  Weight: 180 lb 11.2 oz (82 kg)    Physical Exam Constitutional:      General: He is not in acute distress. HENT:     Head: Normocephalic and atraumatic.  Eyes:     General: No scleral icterus. Cardiovascular:     Rate and Rhythm: Normal rate and regular rhythm.  Pulmonary:     Effort: Pulmonary effort is normal. No respiratory distress.  Abdominal:     General: There is no distension.     Palpations: Abdomen is soft.  Musculoskeletal:        General: No deformity. Normal range of motion.     Cervical back: Normal range of motion and neck supple.  Skin:    Findings: No erythema or rash.  Neurological:     Mental Status: He is alert and oriented to person, place, and time. Mental status is at baseline.  Psychiatric:        Mood and Affect: Mood normal.     LABORATORY DATA:  I have reviewed the data as listed    Latest Ref Rng & Units  06/09/2023   10:56 AM 05/12/2023    9:19 AM 04/21/2023   11:53 AM  CBC  WBC 4.0 - 10.5 K/uL 7.2  8.0  6.9   Hemoglobin 13.0 - 17.0 g/dL 16.1  09.6  04.5   Hematocrit 39.0 - 52.0 % 41.4  41.7  41.7   Platelets 150 - 400 K/uL 202  208  206       Latest Ref Rng & Units 06/09/2023   10:56 AM 05/12/2023    9:19 AM 04/21/2023   11:52 AM  CMP  Glucose 70 - 99 mg/dL 409  99  98   BUN 8 - 23 mg/dL 25  21  28    Creatinine 0.61 - 1.24 mg/dL 8.11  9.14  7.82   Sodium 135 - 145 mmol/L 136  138  136   Potassium 3.5 - 5.1 mmol/L 4.3  4.2  4.2   Chloride 98 - 111 mmol/L 103  105  104   CO2 22 - 32 mmol/L 27  27  25    Calcium 8.9 - 10.3 mg/dL 9.1  9.3  9.4   Total Protein 6.5 - 8.1 g/dL 7.1  7.1  7.5   Total Bilirubin 0.3 - 1.2 mg/dL 0.8  1.1  0.5   Alkaline Phos 38 - 126 U/L 43  43  40   AST 15 - 41 U/L 18  24  23    ALT 0 - 44 U/L 15  22  22        RADIOGRAPHIC STUDIES: I have personally reviewed the radiological images as listed and agreed with the findings in the report. NM PET (PSMA) SKULL TO MID THIGH  Result Date: 03/14/2023 CLINICAL DATA:  Elevated PSA equal 167. EXAM: NUCLEAR MEDICINE PET SKULL BASE TO THIGH TECHNIQUE:  9.7 mCi F18 Piflufolastat (Pylarify) was injected intravenously. Full-ring PET imaging was performed from the skull base to thigh after the radiotracer. CT data was obtained and used for attenuation correction and anatomic localization. COMPARISON:  None Available. FINDINGS: NECK No radiotracer activity in neck lymph nodes. Incidental CT finding: None. CHEST Intense radiotracer avid RIGHT lower paratracheal nodes. Conglomerate of approximately 3 nodes measures 1.7 x 4.2 cm (image 101) with SUV max equal 22. No suspicious pulmonary nodules Nodular implant beneath the RIGHT diaphragm at the level of the upper pole of the RIGHT kidney measures 19 mm on image 143 with SUV max equal 15. Incidental CT finding: None. ABDOMEN/PELVIS Prostate: Intense radiotracer activity involves the near  entirety of the RIGHT lobe of the prostate gland with extension to the LEFT lobe. Activity is intense with SUV max equal 19 (image 211). Abnormal radiotracer activity extends into the RIGHT seminal vesicle (image 200) Lymph nodes: Large intense radiotracer avid lymph node posterior RIGHT of the prostate gland adjacent to the rectum measures 14 mm (219) with SUV max equal 27 (image 209) Similar large lymph node in the presacral space measures 19 mm with SUV max equal 30 on image 196. Smaller intense radiotracer avid node in the presacral space on image 190 Liver: No evidence of liver metastasis. Incidental CT finding: None. SKELETON Intense radiotracer avid lesions in the LEFT and RIGHT posterior iliac bones associated sclerotic lesions. For example along the RIGHT with SUV max equal 8.9. Lesion in the midthoracic spine at T7 and and T10. Lesion in the RIGHT acromion process is intense IMPRESSION: 1. Intense radiotracer activity involving the near entirety of the RIGHT lobe of the prostate gland with extension to the LEFT lobe consistent with primary prostate adenocarcinoma. 2. Intense radiotracer activity extends into the RIGHT seminal vesicle consistent with nodal metastasis. 3. Intense radiotracer avid lymph nodes in the pelvis consistent with nodal metastasis. 4. Intense radiotracer avid metastatic adenopathy in the RIGHT lower paratracheal space consistent with mediastinal nodal metastasis. 5. Intense radiotracer avid metastatic disease to the skeleton with involvement of the thoracic spine, iliac bones, and RIGHT acromion process. 6. Nodular implant beneath the RIGHT diaphragm at the level of the upper pole of the RIGHT kidney consistent with metastatic implant. Electronically Signed   By: Genevive Bi M.D.   On: 03/14/2023 16:36

## 2023-06-09 NOTE — Assessment & Plan Note (Signed)
Recommend patient to start omeprazole 20mg  daily.- OTC supply

## 2023-06-09 NOTE — Assessment & Plan Note (Addendum)
Eligard 45mg  Q6 months next due March 2025

## 2023-06-09 NOTE — Assessment & Plan Note (Signed)
Check A1c 

## 2023-06-09 NOTE — Assessment & Plan Note (Signed)
Reviewed PET scan images with patient.  Consider repeat PMSA PET in the future.  Avoid heavy weight lifting.  Could consider palliative RT if back pain persists despite treatment.

## 2023-06-09 NOTE — Assessment & Plan Note (Addendum)
Stage IV metastatic prostate cancer.  Check molecular testing NGS He tolerates Abiaterone 1000mg  daily with prednisone 5mg  daily.  Continue current regimen.  BP is trending slightly higher, likely due to Abiaterone, recommend to monitor at home, consider BP regimen adjustment if BP is persistently out of control.

## 2023-06-10 ENCOUNTER — Other Ambulatory Visit: Payer: Self-pay

## 2023-06-16 ENCOUNTER — Other Ambulatory Visit: Payer: Self-pay

## 2023-06-20 ENCOUNTER — Other Ambulatory Visit: Payer: Self-pay | Admitting: Oncology

## 2023-06-20 ENCOUNTER — Other Ambulatory Visit: Payer: Self-pay

## 2023-06-20 ENCOUNTER — Other Ambulatory Visit (HOSPITAL_COMMUNITY): Payer: Self-pay

## 2023-06-20 DIAGNOSIS — C61 Malignant neoplasm of prostate: Secondary | ICD-10-CM

## 2023-06-20 MED ORDER — PREDNISONE 5 MG PO TABS
5.0000 mg | ORAL_TABLET | Freq: Every day | ORAL | 1 refills | Status: DC
  Filled 2023-06-20: qty 30, 30d supply, fill #0
  Filled 2023-07-18: qty 30, 30d supply, fill #1

## 2023-06-20 NOTE — Progress Notes (Signed)
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    RR sent for prednisone.

## 2023-06-20 NOTE — Progress Notes (Signed)
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   Therapeutic benefit summary: Patient is achieving benefit   Adverse events/side effects summary: No adverse events/side effects   Patient's therapy is appropriate to: Continue    Goals Addressed             This Visit's Progress    Slow Disease Progression       Patient is not on track and improving. Patient will maintain adherence and adhere to provider and/or lab appointments. PSA trending down significantly to 1.06 on 06/08/26. Will continue to monitor.         Follow up:  3 months  Otto Herb Specialty Pharmacist

## 2023-06-26 ENCOUNTER — Other Ambulatory Visit (HOSPITAL_COMMUNITY): Payer: Self-pay

## 2023-07-18 ENCOUNTER — Other Ambulatory Visit: Payer: Self-pay

## 2023-07-18 ENCOUNTER — Encounter: Payer: Self-pay | Admitting: Oncology

## 2023-07-18 NOTE — Progress Notes (Signed)
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

## 2023-07-20 ENCOUNTER — Other Ambulatory Visit: Payer: Self-pay

## 2023-07-22 ENCOUNTER — Other Ambulatory Visit: Payer: Self-pay

## 2023-07-22 ENCOUNTER — Encounter: Payer: Self-pay | Admitting: Oncology

## 2023-07-23 ENCOUNTER — Ambulatory Visit: Payer: Medicare HMO | Admitting: Urology

## 2023-08-03 ENCOUNTER — Encounter: Payer: Self-pay | Admitting: Oncology

## 2023-08-11 ENCOUNTER — Encounter: Payer: Self-pay | Admitting: Oncology

## 2023-08-11 ENCOUNTER — Other Ambulatory Visit (HOSPITAL_COMMUNITY): Payer: Self-pay

## 2023-08-11 ENCOUNTER — Inpatient Hospital Stay: Payer: Medicare HMO | Attending: Oncology

## 2023-08-11 ENCOUNTER — Other Ambulatory Visit: Payer: Self-pay

## 2023-08-11 ENCOUNTER — Inpatient Hospital Stay: Payer: Medicare HMO | Admitting: Oncology

## 2023-08-11 VITALS — BP 114/67 | HR 85 | Temp 97.2°F | Resp 18 | Wt 184.1 lb

## 2023-08-11 DIAGNOSIS — Z7952 Long term (current) use of systemic steroids: Secondary | ICD-10-CM | POA: Diagnosis not present

## 2023-08-11 DIAGNOSIS — Z79899 Other long term (current) drug therapy: Secondary | ICD-10-CM | POA: Insufficient documentation

## 2023-08-11 DIAGNOSIS — M549 Dorsalgia, unspecified: Secondary | ICD-10-CM | POA: Diagnosis not present

## 2023-08-11 DIAGNOSIS — C61 Malignant neoplasm of prostate: Secondary | ICD-10-CM | POA: Insufficient documentation

## 2023-08-11 DIAGNOSIS — Z79818 Long term (current) use of other agents affecting estrogen receptors and estrogen levels: Secondary | ICD-10-CM

## 2023-08-11 DIAGNOSIS — R0789 Other chest pain: Secondary | ICD-10-CM | POA: Insufficient documentation

## 2023-08-11 DIAGNOSIS — C7951 Secondary malignant neoplasm of bone: Secondary | ICD-10-CM | POA: Insufficient documentation

## 2023-08-11 DIAGNOSIS — R079 Chest pain, unspecified: Secondary | ICD-10-CM | POA: Diagnosis not present

## 2023-08-11 LAB — CBC WITH DIFFERENTIAL (CANCER CENTER ONLY)
Abs Immature Granulocytes: 0.03 10*3/uL (ref 0.00–0.07)
Basophils Absolute: 0 10*3/uL (ref 0.0–0.1)
Basophils Relative: 0 %
Eosinophils Absolute: 0 10*3/uL (ref 0.0–0.5)
Eosinophils Relative: 0 %
HCT: 43.2 % (ref 39.0–52.0)
Hemoglobin: 14.2 g/dL (ref 13.0–17.0)
Immature Granulocytes: 0 %
Lymphocytes Relative: 21 %
Lymphs Abs: 1.6 10*3/uL (ref 0.7–4.0)
MCH: 27.8 pg (ref 26.0–34.0)
MCHC: 32.9 g/dL (ref 30.0–36.0)
MCV: 84.7 fL (ref 80.0–100.0)
Monocytes Absolute: 0.4 10*3/uL (ref 0.1–1.0)
Monocytes Relative: 5 %
Neutro Abs: 5.5 10*3/uL (ref 1.7–7.7)
Neutrophils Relative %: 74 %
Platelet Count: 225 10*3/uL (ref 150–400)
RBC: 5.1 MIL/uL (ref 4.22–5.81)
RDW: 12.7 % (ref 11.5–15.5)
WBC Count: 7.5 10*3/uL (ref 4.0–10.5)
nRBC: 0 % (ref 0.0–0.2)

## 2023-08-11 LAB — CMP (CANCER CENTER ONLY)
ALT: 17 U/L (ref 0–44)
AST: 21 U/L (ref 15–41)
Albumin: 4.4 g/dL (ref 3.5–5.0)
Alkaline Phosphatase: 45 U/L (ref 38–126)
Anion gap: 11 (ref 5–15)
BUN: 28 mg/dL — ABNORMAL HIGH (ref 8–23)
CO2: 28 mmol/L (ref 22–32)
Calcium: 9.7 mg/dL (ref 8.9–10.3)
Chloride: 101 mmol/L (ref 98–111)
Creatinine: 1.04 mg/dL (ref 0.61–1.24)
GFR, Estimated: >60 mL/min (ref >60)
Glucose, Bld: 110 mg/dL — ABNORMAL HIGH (ref 70–99)
Potassium: 4.3 mmol/L (ref 3.5–5.1)
Sodium: 140 mmol/L (ref 135–145)
Total Bilirubin: 0.8 mg/dL (ref <1.2)
Total Protein: 7.6 g/dL (ref 6.5–8.1)

## 2023-08-11 LAB — PSA: Prostatic Specific Antigen: 0.15 ng/mL (ref 0.00–4.00)

## 2023-08-11 MED ORDER — PREDNISONE 5 MG PO TABS
5.0000 mg | ORAL_TABLET | Freq: Every day | ORAL | 1 refills | Status: DC
  Filled 2023-08-11 – 2023-08-25 (×3): qty 30, 30d supply, fill #0
  Filled 2023-09-19: qty 30, 30d supply, fill #1

## 2023-08-11 MED ORDER — ABIRATERONE ACETATE 250 MG PO TABS
1000.0000 mg | ORAL_TABLET | Freq: Every day | ORAL | 1 refills | Status: DC
Start: 2023-08-11 — End: 2023-10-13
  Filled 2023-08-11 – 2023-08-25 (×3): qty 120, 30d supply, fill #0
  Filled 2023-09-19: qty 120, 30d supply, fill #1

## 2023-08-11 NOTE — Assessment & Plan Note (Signed)
Patient has intubated lower back pain.  I will obtain MRI lower lumbar for further evaluation. Consider PSMA PET scan in the future. Avoid heavy weight lifting.  Could consider palliative RT if needed.

## 2023-08-11 NOTE — Assessment & Plan Note (Addendum)
Eligard 45mg  Q6 months next due March 2025 Discussed with patient that hot flashes expected from Eligard treatments.

## 2023-08-11 NOTE — Progress Notes (Signed)
Hematology/Oncology Progress note [phone] [phone]           REFERRING PROVIDER: McDiarmid, Leighton Roach, MD   CHIEF COMPLAINTS/REASON FOR VISIT:  Metastatic prostate cancer   ASSESSMENT & PLAN:   Cancer Staging  Prostate cancer [facility]) Staging form: Prostate, AJCC 8th Edition - Clinical stage from 03/21/2023: Stage IVA (cT3b, cN1, cM0, PSA: 167) - Signed by Rickard Patience, MD on 03/21/2023   Prostate cancer (HCC) Stage IV metastatic prostate cancer.  NGS showed -no potentially actionable variants. He tolerates Abiraterone 1000mg  daily with prednisone 5mg  daily.  Continue current regimen.  PSA has responded nicely and decreased to 0.15 today.   Androgen deprivation therapy Eligard 45mg  Q6 months next due March 2025 Discussed with patient that hot flashes expected from Eligard treatments.  Metastasis to bone [facility]) Patient has intubated lower back pain.  I will obtain MRI lower lumbar for further evaluation. Consider PSMA PET scan in the future. Avoid heavy weight lifting.  Could consider palliative RT if needed.  Chest pressure Recommend patient to establish care with cardiology for further evaluation.  He may also further discuss with primary care provider.  Discussed ER triggers     Orders Placed This Encounter  Procedures   MR Lumbar Spine W Wo Contrast    Standing Status:   Future    Standing Expiration Date:   08/10/2024    Order Specific Question:   If indicated for the ordered procedure, I authorize the administration of contrast media per Radiology protocol    Answer:   Yes    Order Specific Question:   What is the patient's sedation requirement?    Answer:   No Sedation    Order Specific Question:   Does the patient have a pacemaker or implanted devices?    Answer:   No    Order Specific Question:   Use SRS Protocol?    Answer:   No    Order Specific Question:   Preferred imaging location?    Answer:   [facility] (table limit -  550lbs)   CMP (Cancer Center only)    Standing Status:   Future    Standing Expiration Date:   08/10/2024   CBC with Differential (Cancer Center Only)    Standing Status:   Future    Standing Expiration Date:   08/10/2024   PSA    Standing Status:   Future    Standing Expiration Date:   08/10/2024   Ambulatory referral to Cardiology    Referral Priority:   Routine    Referral Type:   Consultation    Referral Reason:   Specialty Services Required    Number of Visits Requested:   1   Follow-up 2 months All questions were answered. The patient knows to call the clinic with any problems, questions or concerns.  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  Oncology History  Prostate cancer (HCC)  02/21/2023 Imaging   CT angiogram chest aorta with contrast showed 1. No acute intrathoracic abnormality. 2. 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 Continue annual imaging followup by CTA or MRA. This recommendation follows 2010 ACCF/AHA/AATS/ACR/ASA/SCA/SCAI/SIR/STS/SVM Guidelines for the Diagnosis and Management of Patients with Thoracic Aortic Disease. Circulation. 2010;  121: [id number]. Aortic aneurysm NOS (ICD10-I71.9) 3. Severe burden multivessel coronary atherosclerosis, greatest within the LAD. 4. Enlarged RIGHT hilar lymph nodes, suspicious. RIGHT posterior apical scarring without a discrete nodule. No suspicious mass within the RIGHT lung.   03/11/2023 Initial Diagnosis   Prostate cancer [facility])  April 2024, patient's PSA was elevated 167. Patient saw urologist.  Status post prostate biopsy.  Results were not available in epic.  Per patient he was diagnosed with prostate cancer.   03/14/2023 Imaging   PSMA PET scan showed 1. Intense radiotracer  activity involving the near entirety of the RIGHT lobe of the prostate gland with extension to the LEFT lobe consistent with primary prostate adenocarcinoma. 2. Intense radiotracer activity extends into the RIGHT seminal vesicle consistent with nodal metastasis. 3. Intense radiotracer avid lymph nodes in the pelvis consistent with nodal metastasis. 4. Intense radiotracer avid metastatic adenopathy in the RIGHT lower paratracheal space consistent with mediastinal nodal metastasis. 5. Intense radiotracer avid metastatic disease to the skeleton with involvement of the thoracic spine, iliac bones, and RIGHT acromion process. 6. Nodular implant beneath the RIGHT diaphragm at the level of the upper pole of the RIGHT kidney consistent with metastatic implant.   03/21/2023 Cancer Staging   Staging form: Prostate, AJCC 8th Edition - Clinical stage from 03/21/2023: Stage IVA (cT3b, cN1, cM0, PSA: 167) - Signed by Rickard Patience, MD on 03/21/2023 Stage prefix: Initial diagnosis Prostate specific antigen (PSA) range: 20 or greater   03/21/2023 Tumor Marker   PSA 329   04/21/2023 Tumor Marker   PSA 7.84   04/25/2023 -  Chemotherapy   Patient started on Abiaterone 1000mg  daily and Prednisone 5mg  daily.      He has established care with Urology, and has started on Flomax. Urinary urgency has improved.  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  Today he reports feeling ok  Overall he tolerates Abiaterone and prednisone regimen + Fatigue and hot flash + Lower back pain intermittently, 4 out of 10 + Occasionally he experiences brain fog + Occasionally may experience mild substernal chest pressure    MEDICAL HISTORY:  Past Medical History:  Diagnosis Date   Abdominal wall hernia 03/20/2018   ATTENTION DEFICIT, W/O HYPERACTIVITY 10/30/2006   Qualifier: History of  By: McDiarmid MD, Todd     Cataract    bilateral cataracts with lens implant   Chest pain,  atypical 12/05/2017   COLON POLYP 02/07/2014   Tubular Adenoma x 1 - Dr Christella Hartigan (GI)   Diverticulosis of colon 07/09/2018   Based on colonoscopy 06/2108   Encounter for neuropsychological testing 06/26/2015   [facility]: Only B/L avg in rote memory auditory info encoding. No mood dysfnc   Encounter for neuropsychological testing 2008   Albertine Grates (Neuropsy): Mixed anxiety and depression o/w normal.    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  Repeat testing 06/2015 at [facility]: Only finding was below average in rote memory auditory info encoding.  No mood dysfunction.    Obesity (BMI 30.0-34.9) 10/30/2006   Qualifier: History of   By: McDiarmid MD, Todd       Overweight (BMI 25.0-29.9) 10/30/2006   Qualifier: History of  By: McDiarmid MD, Todd     Prediabetes 12/21/2013   Pulmonary nodule, left 06/09/2015   Pure hypercholesterolemia 10/30/2006   ACC calculation 10-yr risk of CV event of 9.5% with recommendation to consider Statin therapy of moderate-to-high potency (12/21/13)     Rotator cuff tear 12/08/2020   Shoulder injury, right, initial encounter 11/02/2020    SURGICAL HISTORY: Past Surgical History:  Procedure Laterality Date   CATARACT EXTRACTION W/ INTRAOCULAR LENS  IMPLANT, BILATERAL     COCCYGECTOMY  1985   VASECTOMY      SOCIAL HISTORY: Social History   Socioeconomic History   Marital status: Married    Spouse name: Darl Pikes   Number of  children: 4   Years of education: >20   Highest education level: Not on file  Occupational History   Occupation: Education officer, community   Occupation: Pharmacist  Tobacco Use   Smoking status: Never   Smokeless tobacco: Never  Vaping Use   Vaping status: Never Used  Substance and Sexual Activity   Alcohol use: Yes    Alcohol/week: 3.0 standard drinks of alcohol    Types: 3 drink(s) per week   Drug use: No   Sexual activity: Yes    Comment: monogamous  Other Topics Concern   Not on file  Social History Narrative   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   4 adult sons ([place], [place], [place]): 3 biological and 1 step-child   Patient is third of six children   No formal HCPOA executed (12/21/13)   No Advanced Directive executed (12/21/13)   He and wife are raising a granddaughter (2016)   Hobbies: Motorcycling and Horse riding.         Social Determinants of Health   Financial Resource Strain: Not on file  Food Insecurity: No Food Insecurity (03/21/2023)   Hunger Vital Sign    Worried About Running Out of Food in the Last Year: Never true    Ran Out of Food in the Last Year: Never true  Transportation Needs: No Transportation Needs (03/21/2023)   PRAPARE - Administrator, Civil Service (Medical): No    Lack of Transportation (Non-Medical): No  Physical Activity: Not on file  Stress: Not on file  Social Connections: Not on file  Intimate Partner Violence: Not At Risk (03/21/2023)   Humiliation, Afraid, Rape, and Kick questionnaire    Fear of Current or Ex-Partner: No    Emotionally Abused: No    Physically Abused: No    Sexually Abused: No    FAMILY HISTORY: Family History  Problem Relation Age of Onset   Hypertension Mother    Stroke Mother 70   Dementia Mother 53   Diabetes Father    Diabetes Sister    Diabetes Brother    Esophageal cancer Brother    Lymphoma Brother    Esophageal cancer Brother    Lymphoma Brother    Diabetes Brother    Colon cancer  Neg Hx     ALLERGIES:  is allergic to cephalosporins.  MEDICATIONS:  Current Outpatient Medications  Medication Sig Dispense Refill   lisinopril-hydrochlorothiazide (ZESTORETIC) 20-25 MG tablet Take 1 tablet by mouth daily.     predniSONE (DELTASONE) 5 MG tablet Take 1 tablet (5 mg total) by mouth  daily with breakfast. 30 tablet 1   tadalafil (CIALIS) 5 MG tablet TAKE 1 TABLET BY MOUTH ONCE DAILY AS NEEDED 90 tablet 3   tamsulosin (FLOMAX) 0.4 MG CAPS capsule Take 1 capsule (0.4 mg total) by mouth daily. 30 capsule 3   Vibegron (GEMTESA) 75 MG TABS Take 1 tablet (75 mg total) by mouth daily. 30 tablet 3   abiraterone acetate (ZYTIGA) 250 MG tablet Take 4 tablets (1,000 mg total) by mouth daily. Take on an empty stomach 1 hour before or 2 hours after a meal 120 tablet 1   azithromycin (ZITHROMAX) 500 MG tablet Take 500 mg by mouth daily. (Patient not taking: Reported on 08/11/2023)     No current facility-administered medications for this visit.    Review of Systems  Constitutional:  Negative for appetite change, chills, fatigue, fever and unexpected weight change.  HENT:   Negative for hearing loss and voice change.   Eyes:  Negative for eye problems and icterus.  Respiratory:  Negative for chest tightness, cough and shortness of breath.   Cardiovascular:  Negative for chest pain and leg swelling.  Gastrointestinal:  Negative for abdominal distention and abdominal pain.  Endocrine: Negative for hot flashes.  Genitourinary:  Negative for difficulty urinating, dysuria and frequency.   Musculoskeletal:  Positive for arthralgias and back pain.  Skin:  Negative for itching and rash.  Neurological:  Negative for light-headedness and numbness.  Hematological:  Negative for adenopathy. Does not bruise/bleed easily.  Psychiatric/Behavioral:  Negative for confusion.    PHYSICAL EXAMINATION: ECOG PERFORMANCE STATUS: 0 - Asymptomatic Vitals:   08/11/23 1119  BP: 114/67  Pulse: 85  Resp: 18   Temp: (!) 97.2 F (36.2 C)  SpO2: 100%   Filed Weights   08/11/23 1119  Weight: 184 lb 1.6 oz (83.5 kg)    Physical Exam Constitutional:      General: He is not in acute distress. HENT:     Head: Normocephalic and atraumatic.  Eyes:     General: No scleral icterus. Cardiovascular:     Rate and Rhythm: Normal rate and regular rhythm.  Pulmonary:     Effort: Pulmonary effort is normal. No respiratory distress.  Abdominal:     General: There is no distension.     Palpations: Abdomen is soft.  Musculoskeletal:        General: No deformity. Normal range of motion.     Cervical back: Normal range of motion and neck supple.  Skin:    Findings: No erythema or rash.  Neurological:     Mental Status: He is alert and oriented to person, place, and time. Mental status is at baseline.  Psychiatric:        Mood and Affect: Mood normal.     LABORATORY DATA:  I have reviewed the data as listed    Latest Ref Rng & Units 08/11/2023   10:57 AM 06/09/2023   10:56 AM 05/12/2023    9:19 AM  CBC  WBC 4.0 - 10.5 K/uL 7.5  7.2  8.0   Hemoglobin 13.0 - 17.0 g/dL 30.8  65.7  84.6   Hematocrit 39.0 - 52.0 % 43.2  41.4  41.7   Platelets 150 - 400 K/uL 225  202  208       Latest Ref Rng & Units 08/11/2023   10:57 AM 06/09/2023   10:56 AM 05/12/2023    9:19 AM  CMP  Glucose 70 - 99 mg/dL 962  952  99  BUN 8 - 23 mg/dL 28  25  21    Creatinine 0.61 - 1.24 mg/dL 4.74  2.59  5.63   Sodium 135 - 145 mmol/L 140  136  138   Potassium 3.5 - 5.1 mmol/L 4.3  4.3  4.2   Chloride 98 - 111 mmol/L 101  103  105   CO2 22 - 32 mmol/L 28  27  27    Calcium 8.9 - 10.3 mg/dL 9.7  9.1  9.3   Total Protein 6.5 - 8.1 g/dL 7.6  7.1  7.1   Total Bilirubin <1.2 mg/dL 0.8  0.8  1.1   Alkaline Phos 38 - 126 U/L 45  43  43   AST 15 - 41 U/L 21  18  24    ALT 0 - 44 U/L 17  15  22        RADIOGRAPHIC STUDIES: I have personally reviewed the radiological images as listed and agreed with the findings in the  report. No results found.

## 2023-08-11 NOTE — Progress Notes (Signed)
PT here for follow up. Reports having fatigue, hot flashes, lower back pain and brain fog. Pt reports occasional mild substernal chest pain

## 2023-08-11 NOTE — Assessment & Plan Note (Addendum)
Stage IV metastatic prostate cancer.  NGS showed -no potentially actionable variants. He tolerates Abiraterone 1000mg  daily with prednisone 5mg  daily.  Continue current regimen.  PSA has responded nicely and decreased to 0.15 today.

## 2023-08-11 NOTE — Assessment & Plan Note (Signed)
Recommend patient to establish care with cardiology for further evaluation.  He may also further discuss with primary care provider.  Discussed ER triggers

## 2023-08-12 ENCOUNTER — Other Ambulatory Visit: Payer: Self-pay

## 2023-08-15 ENCOUNTER — Encounter: Payer: Self-pay | Admitting: Urology

## 2023-08-15 ENCOUNTER — Other Ambulatory Visit (HOSPITAL_COMMUNITY): Payer: Self-pay

## 2023-08-15 ENCOUNTER — Ambulatory Visit: Payer: Medicare HMO | Admitting: Urology

## 2023-08-15 ENCOUNTER — Other Ambulatory Visit: Payer: Self-pay

## 2023-08-15 VITALS — BP 131/77 | HR 81 | Ht 68.0 in | Wt 180.0 lb

## 2023-08-15 DIAGNOSIS — C61 Malignant neoplasm of prostate: Secondary | ICD-10-CM | POA: Diagnosis not present

## 2023-08-15 DIAGNOSIS — R350 Frequency of micturition: Secondary | ICD-10-CM | POA: Diagnosis not present

## 2023-08-15 DIAGNOSIS — R399 Unspecified symptoms and signs involving the genitourinary system: Secondary | ICD-10-CM | POA: Diagnosis not present

## 2023-08-15 MED ORDER — GEMTESA 75 MG PO TABS
75.0000 mg | ORAL_TABLET | Freq: Every day | ORAL | 11 refills | Status: AC
  Filled 2023-08-15: qty 30, 30d supply, fill #0
  Filled 2023-09-19: qty 30, 30d supply, fill #1
  Filled 2023-10-21 – 2023-11-12 (×2): qty 30, 30d supply, fill #2
  Filled 2023-12-10: qty 30, 30d supply, fill #3
  Filled 2024-01-03: qty 30, 30d supply, fill #4
  Filled 2024-02-02: qty 30, 30d supply, fill #5
  Filled 2024-03-06: qty 30, 30d supply, fill #6
  Filled 2024-04-03 – 2024-04-13 (×2): qty 30, 30d supply, fill #7
  Filled 2024-05-10: qty 30, 30d supply, fill #8
  Filled 2024-06-04: qty 30, 30d supply, fill #9
  Filled 2024-07-12: qty 30, 30d supply, fill #10
  Filled 2024-08-11: qty 30, 30d supply, fill #11

## 2023-08-15 NOTE — Progress Notes (Signed)
Christopher Wall,acting as a scribe for Christopher Altes, MD.,have documented all relevant documentation on the behalf of Christopher Altes, MD,as directed by  Christopher Altes, MD while in the presence of Christopher Altes, MD.  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   Refer to my previous note from 04/18/2023 He noted marked improvement in his voiding symptoms on Gemtesa and presently has no bothersome lower urinary tract symptoms He stopped his tamsulosin approximately 1 month ago and has not seen any worsening of his voiding symptoms He remains on Leuprolide and Abiraterone/ prednisone and his PSA earlier this month was 0.15.   PMH: Past Medical History:  Diagnosis Date   Abdominal wall hernia 03/20/2018   ATTENTION DEFICIT, W/O HYPERACTIVITY 10/30/2006   Qualifier: History of  By: McDiarmid MD, Tawanna Cooler     Cataract    bilateral cataracts with lens implant   Chest pain, atypical 12/05/2017   COLON POLYP 02/07/2014   Tubular Adenoma x 1 - Dr Christella Hartigan (GI)   Diverticulosis of colon 07/09/2018   Based on colonoscopy 06/2108   Encounter for neuropsychological testing 06/26/2015   [facility]: Only B/L avg in rote memory auditory info encoding. No mood dysfnc   Encounter for neuropsychological testing 2008   Albertine Grates (Neuropsy): Mixed anxiety and depression o/w normal.    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  Repeat testing 06/2015 at [facility]: Only finding was below average in rote memory auditory info encoding. No mood dysfunction.    Obesity (BMI 30.0-34.9) 10/30/2006   Qualifier: History of   By: McDiarmid MD, Todd       Overweight (BMI 25.0-29.9) 10/30/2006   Qualifier: History of  By: McDiarmid MD, Todd     Prediabetes 12/21/2013   Pulmonary nodule, left 06/09/2015   Pure hypercholesterolemia 10/30/2006   ACC calculation 10-yr risk of CV event of 9.5% with recommendation to consider Statin therapy of moderate-to-high potency (12/21/13)     Rotator cuff tear 12/08/2020   Shoulder injury, right, initial encounter 11/02/2020    Surgical History: Past Surgical History:  Procedure Laterality Date   CATARACT EXTRACTION W/ INTRAOCULAR LENS  IMPLANT, BILATERAL     COCCYGECTOMY  1985   VASECTOMY      Home Medications:  Allergies as of 08/15/2023       Reactions   Cephalosporins Rash        Medication List        Accurate as of August 15, 2023 10:45 AM. If you have any questions, ask your nurse or doctor.          STOP taking these medications    azithromycin 500 MG tablet Commonly  known as: ZITHROMAX   tadalafil 5 MG tablet Commonly known as: CIALIS       TAKE these medications    abiraterone acetate 250 MG tablet Commonly known as: ZYTIGA Take 4 tablets (1,000 mg total) by mouth daily. Take on an empty stomach 1 hour before or 2 hours after a meal   Gemtesa 75 MG Tabs Generic drug: Vibegron Take 1 tablet (75 mg total) by mouth daily.   lisinopril-hydrochlorothiazide 20-25 MG tablet Commonly known as: ZESTORETIC Take 1 tablet by mouth daily.   predniSONE 5 MG tablet Commonly known as: DELTASONE Take 1 tablet (5 mg total) by mouth daily  with breakfast.   tamsulosin 0.4 MG Caps capsule Commonly known as: FLOMAX Take 1 capsule (0.4 mg total) by mouth daily.        Allergies:  Allergies  Allergen Reactions   Cephalosporins Rash    Family History: Family History  Problem Relation Age of Onset   Hypertension Mother    Stroke Mother 83   Dementia Mother 52   Diabetes Father    Diabetes Sister    Diabetes Brother    Esophageal cancer Brother    Lymphoma Brother    Esophageal cancer Brother    Lymphoma Brother    Diabetes Brother    Colon cancer Neg Hx     Social History:  reports that he has never smoked. He has never used smokeless tobacco. He reports current alcohol use of about 3.0 standard drinks of alcohol per week. He reports that he does not use drugs.   Physical Exam: BP 131/77   Pulse 81   Ht 5\' 8"  (1.727 m)   Wt 180 lb (81.6 kg)   BMI 27.37 kg/m   Constitutional:  Alert and oriented, No acute distress. HEENT: [place] AT Psychiatric: Normal mood and affect.  Assessment & Plan:    1. Lower urinary tract symptoms Doing well on Gemtesa alone; refills sent to pharmacy Annual follow-up and call earlier for worsening voiding symptoms  2. Metastatic prostate cancer Continue regular medical oncology follow-ups.    I have reviewed the above documentation for accuracy and completeness, and I agree with the above.   Christopher Altes, MD  [facility] [address], [address] [place], [place] [address] [phone]

## 2023-08-16 ENCOUNTER — Other Ambulatory Visit: Payer: Self-pay

## 2023-08-19 ENCOUNTER — Other Ambulatory Visit: Payer: Self-pay

## 2023-08-22 ENCOUNTER — Ambulatory Visit
Admission: RE | Admit: 2023-08-22 | Discharge: 2023-08-22 | Disposition: A | Discharge status: Home / Self Care | Payer: Medicare HMO | Source: Ambulatory Visit | Attending: Oncology | Admitting: Oncology

## 2023-08-22 ENCOUNTER — Other Ambulatory Visit: Payer: Self-pay | Admitting: Family Medicine

## 2023-08-22 DIAGNOSIS — M48061 Spinal stenosis, lumbar region without neurogenic claudication: Secondary | ICD-10-CM | POA: Diagnosis not present

## 2023-08-22 DIAGNOSIS — M549 Dorsalgia, unspecified: Secondary | ICD-10-CM | POA: Diagnosis not present

## 2023-08-22 DIAGNOSIS — M47816 Spondylosis without myelopathy or radiculopathy, lumbar region: Secondary | ICD-10-CM | POA: Diagnosis not present

## 2023-08-22 DIAGNOSIS — M4807 Spinal stenosis, lumbosacral region: Secondary | ICD-10-CM | POA: Diagnosis not present

## 2023-08-22 DIAGNOSIS — M4805 Spinal stenosis, thoracolumbar region: Secondary | ICD-10-CM | POA: Diagnosis not present

## 2023-08-22 MED ORDER — GADOBUTROL 1 MMOL/ML IV SOLN
7.5000 mL | Freq: Once | INTRAVENOUS | Status: AC | PRN
  Administered 2023-08-22: 7.5 mL via INTRAVENOUS

## 2023-08-23 ENCOUNTER — Encounter: Payer: Self-pay | Admitting: Oncology

## 2023-08-23 ENCOUNTER — Other Ambulatory Visit (HOSPITAL_COMMUNITY): Payer: Self-pay

## 2023-08-25 ENCOUNTER — Other Ambulatory Visit: Payer: Self-pay

## 2023-08-25 ENCOUNTER — Other Ambulatory Visit (HOSPITAL_COMMUNITY): Payer: Self-pay

## 2023-08-25 MED ORDER — LISINOPRIL-HYDROCHLOROTHIAZIDE 20-25 MG PO TABS
1.0000 | ORAL_TABLET | Freq: Every day | ORAL | 3 refills | Status: DC
  Filled 2023-08-25: qty 90, 90d supply, fill #0
  Filled 2023-11-17: qty 90, 90d supply, fill #1
  Filled 2024-02-15: qty 90, 90d supply, fill #2
  Filled 2024-05-17: qty 90, 90d supply, fill #3

## 2023-08-25 NOTE — Progress Notes (Signed)
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

## 2023-09-10 ENCOUNTER — Other Ambulatory Visit: Payer: Self-pay

## 2023-09-10 NOTE — Progress Notes (Signed)
 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   Therapeutic benefit summary: Patient is achieving benefit   Adverse events/side effects summary: No adverse events/side effects   Patient's therapy is appropriate to: Continue    Goals Addressed   None     Follow up:  3 months  Theophil Thivierge E Conlee Sliter Specialty Pharmacist

## 2023-09-10 NOTE — Progress Notes (Signed)
 Clinical Intervention Note  Clinical Intervention Notes: Patient taking Robitussin DM for cold. He is nearing the end of therapy. Minor DDI identified with dextromethorphan component (can cause CNS excitation/serotonin toxicity)   Clinical Intervention Outcomes: Improved therapy adherence   Amee Boothe E Amyia Lodwick Specialty Pharmacist

## 2023-09-12 ENCOUNTER — Other Ambulatory Visit: Payer: Self-pay

## 2023-09-19 ENCOUNTER — Other Ambulatory Visit: Payer: Self-pay

## 2023-09-19 NOTE — Progress Notes (Signed)
Specialty Pharmacy Refill Coordination Note  Christopher Wall, Christopher Wall is a 77 y.o. male contacted today regarding refills of specialty medication(s) Abiraterone Acetate Christopher Wall)   Patient requested Christopher Wall at [facility] at [place] date: 09/22/23   Medication will be filled on 09/19/23.

## 2023-10-09 ENCOUNTER — Other Ambulatory Visit (HOSPITAL_COMMUNITY): Payer: Self-pay

## 2023-10-12 NOTE — Assessment & Plan Note (Addendum)
 Stage IV metastatic prostate cancer.  NGS showed -no potentially actionable variants. He tolerates Abiraterone  1000mg  daily with prednisone  5mg  daily.  Continue current regimen.  PSA is pending We discussed about option of repeat PSMA, patient would like to defer for now.  Continue monitor PSA and plan repeat imaging if PSA rises.

## 2023-10-13 ENCOUNTER — Inpatient Hospital Stay (HOSPITAL_BASED_OUTPATIENT_CLINIC_OR_DEPARTMENT_OTHER): Payer: Medicare HMO | Admitting: Oncology

## 2023-10-13 ENCOUNTER — Encounter: Payer: Self-pay | Admitting: Oncology

## 2023-10-13 ENCOUNTER — Inpatient Hospital Stay: Payer: Medicare HMO | Attending: Oncology

## 2023-10-13 VITALS — BP 131/68 | HR 73 | Temp 97.4°F | Resp 18 | Wt 184.2 lb

## 2023-10-13 DIAGNOSIS — Z79818 Long term (current) use of other agents affecting estrogen receptors and estrogen levels: Secondary | ICD-10-CM | POA: Diagnosis not present

## 2023-10-13 DIAGNOSIS — C61 Malignant neoplasm of prostate: Secondary | ICD-10-CM | POA: Diagnosis not present

## 2023-10-13 DIAGNOSIS — Z79899 Other long term (current) drug therapy: Secondary | ICD-10-CM | POA: Insufficient documentation

## 2023-10-13 DIAGNOSIS — C7951 Secondary malignant neoplasm of bone: Secondary | ICD-10-CM

## 2023-10-13 DIAGNOSIS — Z7952 Long term (current) use of systemic steroids: Secondary | ICD-10-CM | POA: Diagnosis not present

## 2023-10-13 LAB — CBC WITH DIFFERENTIAL (CANCER CENTER ONLY)
Abs Immature Granulocytes: 0.03 10*3/uL (ref 0.00–0.07)
Basophils Absolute: 0 10*3/uL (ref 0.0–0.1)
Basophils Relative: 1 %
Eosinophils Absolute: 0 10*3/uL (ref 0.0–0.5)
Eosinophils Relative: 0 %
HCT: 41.5 % (ref 39.0–52.0)
Hemoglobin: 13.7 g/dL (ref 13.0–17.0)
Immature Granulocytes: 0 %
Lymphocytes Relative: 27 %
Lymphs Abs: 2.2 10*3/uL (ref 0.7–4.0)
MCH: 27.8 pg (ref 26.0–34.0)
MCHC: 33 g/dL (ref 30.0–36.0)
MCV: 84.3 fL (ref 80.0–100.0)
Monocytes Absolute: 0.5 10*3/uL (ref 0.1–1.0)
Monocytes Relative: 6 %
Neutro Abs: 5.3 10*3/uL (ref 1.7–7.7)
Neutrophils Relative %: 66 %
Platelet Count: 212 10*3/uL (ref 150–400)
RBC: 4.92 MIL/uL (ref 4.22–5.81)
RDW: 13.3 % (ref 11.5–15.5)
WBC Count: 8 10*3/uL (ref 4.0–10.5)
nRBC: 0 % (ref 0.0–0.2)

## 2023-10-13 LAB — CMP (CANCER CENTER ONLY)
ALT: 20 U/L (ref 0–44)
AST: 23 U/L (ref 15–41)
Albumin: 4.4 g/dL (ref 3.5–5.0)
Alkaline Phosphatase: 42 U/L (ref 38–126)
Anion gap: 11 (ref 5–15)
BUN: 22 mg/dL (ref 8–23)
CO2: 28 mmol/L (ref 22–32)
Calcium: 9.4 mg/dL (ref 8.9–10.3)
Chloride: 102 mmol/L (ref 98–111)
Creatinine: 0.95 mg/dL (ref 0.61–1.24)
GFR, Estimated: >60 mL/min (ref >60)
Glucose, Bld: 92 mg/dL (ref 70–99)
Potassium: 4.3 mmol/L (ref 3.5–5.1)
Sodium: 141 mmol/L (ref 135–145)
Total Bilirubin: 0.9 mg/dL (ref 0.0–1.2)
Total Protein: 7.2 g/dL (ref 6.5–8.1)

## 2023-10-13 LAB — HEMOGLOBIN A1C
Hgb A1c MFr Bld: 5.7 % — ABNORMAL HIGH (ref 4.8–5.6)
Mean Plasma Glucose: 116.89 mg/dL

## 2023-10-13 MED ORDER — ABIRATERONE ACETATE 250 MG PO TABS
1000.0000 mg | ORAL_TABLET | Freq: Every day | ORAL | 2 refills | Status: DC
  Filled 2023-10-14 – 2023-10-15 (×2): qty 120, 30d supply, fill #0
  Filled 2023-11-11: qty 120, 30d supply, fill #1
  Filled 2023-12-08: qty 120, 30d supply, fill #2

## 2023-10-13 NOTE — Progress Notes (Signed)
 Hematology/Oncology Progress note [phone] [phone]           REFERRING PROVIDER: McDiarmid, Demetra Filter, MD   CHIEF COMPLAINTS/REASON FOR VISIT:  Metastatic prostate cancer   ASSESSMENT & PLAN:   Cancer Staging  Prostate cancer [facility]) Staging form: Prostate, AJCC 8th Edition - Clinical stage from 03/21/2023: Stage IVA (cT3b, cN1, cM0, PSA: 167) - Signed by Timmy Forbes, MD on 03/21/2023   Prostate cancer (HCC) Stage IV metastatic prostate cancer.  NGS showed -no potentially actionable variants. He tolerates Abiraterone  1000mg  daily with prednisone  5mg  daily.  Continue current regimen.  PSA is pending We discussed about option of repeat PSMA, patient would like to defer for now.  Continue monitor PSA and plan repeat imaging if PSA rises.    Androgen deprivation therapy Eligard  45mg  Q6 months next due March 2025   Metastasis to bone [facility]) Patient has  lower back pain.  MRI lumbar showed degenerative disease and known pelvic metastatic disease.  Avoid heavy weight lifting.     Orders Placed This Encounter  Procedures   CBC with Differential (Cancer Center Only)    Standing Status:   Future    Expected Date:   12/11/2023    Expiration Date:   10/12/2024   CMP (Cancer Center only)    Standing Status:   Future    Expected Date:   12/11/2023    Expiration Date:   10/12/2024   PSA    Standing Status:   Future    Expected Date:   12/11/2023    Expiration Date:   10/12/2024   Ambulatory referral to Genetics    Referral Priority:   Routine    Referral Type:   Consultation    Referral Reason:   Specialty Services Required    Number of Visits Requested:   1   Follow-up 2 months All questions were answered. The patient knows to call the clinic with any problems, questions or concerns.  Timmy Forbes, MD, PhD [facility] Hematology Oncology 10/13/2023   HISTORY OF PRESENTING ILLNESS:   Christopher Wall, Christopher Wall is a  78 y.o.  male with PMH listed below was seen  in consultation at the request of  McDiarmid, Demetra Filter, MD  for evaluation of prostate cancer.  Oncology History  Prostate cancer (HCC)  02/21/2023 Imaging   CT angiogram chest aorta with contrast showed 1. No acute intrathoracic abnormality. 2. 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 Continue annual imaging followup by CTA or MRA. This recommendation follows 2010 ACCF/AHA/AATS/ACR/ASA/SCA/SCAI/SIR/STS/SVM Guidelines for the Diagnosis and Management of Patients with Thoracic Aortic Disease. Circulation. 2010; 121: N829-F621. Aortic aneurysm NOS (ICD10-I71.9) 3. Severe burden multivessel coronary atherosclerosis, greatest within the LAD. 4. Enlarged RIGHT hilar lymph nodes, suspicious. RIGHT posterior apical scarring without a discrete nodule. No suspicious mass within the RIGHT lung.   03/11/2023 Initial Diagnosis   Prostate cancer [facility])  April 2024, patient's PSA was elevated 167. Patient saw urologist.  Status post prostate biopsy.  Results were not available in epic.  Per patient he was diagnosed with prostate cancer.   03/14/2023 Imaging   PSMA PET scan showed 1. Intense radiotracer activity involving the near entirety of the RIGHT lobe of the prostate gland with extension to the LEFT lobe consistent with primary prostate adenocarcinoma. 2. Intense radiotracer activity extends into the RIGHT seminal vesicle consistent with nodal metastasis. 3. Intense radiotracer avid lymph nodes in the pelvis consistent with nodal metastasis. 4. Intense radiotracer avid metastatic adenopathy  in the RIGHT lower paratracheal space consistent with mediastinal nodal metastasis. 5. Intense radiotracer avid metastatic disease to the skeleton with involvement of the thoracic spine, iliac bones, and RIGHT acromion process. 6. Nodular implant beneath the RIGHT diaphragm at the level of the upper pole of the RIGHT kidney consistent with metastatic implant.   03/21/2023 Cancer Staging    Staging form: Prostate, AJCC 8th Edition - Clinical stage from 03/21/2023: Stage IVA (cT3b, cN1, cM0, PSA: 167) - Signed by Timmy Forbes, MD on 03/21/2023 Stage prefix: Initial diagnosis Prostate specific antigen (PSA) range: 20 or greater   03/21/2023 Tumor Marker   PSA 329   04/21/2023 Tumor Marker   PSA 7.84   04/25/2023 -  Chemotherapy   Patient started on Abiaterone 1000mg  daily and Prednisone  5mg  daily.      He has established care with Urology, and has started on Flomax . Urinary urgency has improved.  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  Today he reports feeling ok  Overall he tolerates Abiaterone and prednisone  regimen + Fatigue and hot flash    MEDICAL HISTORY:  Past Medical History:  Diagnosis Date   Abdominal wall hernia 03/20/2018   ATTENTION DEFICIT, W/O HYPERACTIVITY 10/30/2006   Qualifier: History of  By: McDiarmid MD, Todd     Cataract    bilateral cataracts with lens implant   Chest pain, atypical 12/05/2017   COLON POLYP 02/07/2014   Tubular Adenoma x 1 - Dr Howard Macho (GI)   Diverticulosis of colon 07/09/2018   Based on colonoscopy 06/2108   Encounter for neuropsychological testing 06/26/2015   [facility]: Only B/L avg in rote memory auditory info encoding. No mood dysfnc   Encounter for neuropsychological testing 2008   [facility] Zelsen (Neuropsy): Mixed anxiety and depression o/w normal.    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  Repeat testing 06/2015 at [facility]: Only finding was below average in rote memory auditory info encoding. No mood dysfunction.    Obesity (BMI 30.0-34.9) 10/30/2006   Qualifier: History of   By: McDiarmid MD, Todd       Overweight (BMI 25.0-29.9) 10/30/2006   Qualifier: History of  By: McDiarmid MD, Todd     Prediabetes 12/21/2013   Pulmonary nodule, left 06/09/2015   Pure hypercholesterolemia 10/30/2006   ACC calculation 10-yr risk of CV event of 9.5% with recommendation to consider Statin therapy of moderate-to-high potency (12/21/13)     Rotator cuff tear 12/08/2020   Shoulder injury, right, initial encounter 11/02/2020    SURGICAL HISTORY: Past Surgical History:  Procedure Laterality Date   CATARACT EXTRACTION W/ INTRAOCULAR LENS  IMPLANT, BILATERAL     COCCYGECTOMY  1985   VASECTOMY      SOCIAL HISTORY: Social History   Socioeconomic History   Marital status: Married    Spouse name: Amalia Badder   Number of children: 4   Years of education: >20   Highest education level: Not on file  Occupational  History   Occupation: Education officer, community   Occupation: Teacher, early years/pre  Tobacco Use   Smoking status: Never   Smokeless tobacco: Never  Vaping Use   Vaping status: Never Used  Substance and Sexual Activity   Alcohol use: Yes    Alcohol/week: 3.0 standard drinks of alcohol    Types: 3 drink(s) per week   Drug use: No   Sexual activity: Yes    Comment: monogamous  Other Topics Concern   Not on file  Social History Narrative   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   4 adult sons ([place], [place] , [place] ): 3 biological and 1 step-child   Patient is third of six children   No formal HCPOA executed (12/21/13)   No Advanced Directive executed (12/21/13)   He and wife are raising a granddaughter (2016)   Hobbies: Motorcycling and Horse riding.         Social Drivers of Corporate investment banker Strain: Not on file  Food Insecurity: No  Food Insecurity (03/21/2023)   Hunger Vital Sign    Worried About Running Out of Food in the Last Year: Never true    Ran Out of Food in the Last Year: Never true  Transportation Needs: No Transportation Needs (03/21/2023)   PRAPARE - Administrator, Civil Service (Medical): No    Lack of Transportation (Non-Medical): No  Physical Activity: Not on file  Stress: Not on file  Social Connections: Not on file  Intimate Partner Violence: Not At Risk (03/21/2023)   Humiliation, Afraid, Rape, and Kick questionnaire    Fear of Current or Ex-Partner: No    Emotionally Abused: No    Physically Abused: No    Sexually Abused: No    FAMILY HISTORY: Family History  Problem Relation Age of Onset   Hypertension Mother    Stroke Mother 51   Dementia Mother 56   Diabetes Father    Diabetes Sister    Diabetes Brother    Esophageal cancer Brother    Lymphoma Brother    Esophageal cancer Brother    Lymphoma Brother    Diabetes Brother    Colon cancer Neg Hx     ALLERGIES:  is allergic to cephalosporins.  MEDICATIONS:  Current Outpatient Medications  Medication Sig Dispense Refill   abiraterone  acetate (ZYTIGA ) 250 MG tablet Take 4 tablets (1,000 mg total) by mouth daily. Take on an empty stomach 1 hour before or 2 hours after a meal 120 tablet 1   lisinopril -hydrochlorothiazide  (ZESTORETIC ) 20-25 MG tablet Take 1 tablet by mouth daily. 90 tablet 3   predniSONE  (DELTASONE ) 5 MG tablet Take 1 tablet (5 mg total) by mouth daily with breakfast. 30 tablet 1   Vibegron  (GEMTESA ) 75 MG TABS Take 1 tablet (75 mg total) by mouth daily. 30 tablet 11   No current facility-administered medications for this visit.    Review of Systems  Constitutional:  Negative for appetite change, chills, fatigue, fever and unexpected weight change.  HENT:   Negative for hearing loss and voice change.   Eyes:  Negative for eye problems and icterus.  Respiratory:  Negative for chest tightness, cough and  shortness of breath.   Cardiovascular:  Negative for chest pain and leg swelling.  Gastrointestinal:  Negative for abdominal distention and abdominal pain.  Endocrine: Negative for hot flashes.  Genitourinary:  Negative for difficulty urinating, dysuria and frequency.   Musculoskeletal:  Positive for arthralgias and back pain.  Skin:  Negative  for itching and rash.  Neurological:  Negative for light-headedness and numbness.  Hematological:  Negative for adenopathy. Does not bruise/bleed easily.  Psychiatric/Behavioral:  Negative for confusion.    PHYSICAL EXAMINATION: ECOG PERFORMANCE STATUS: 0 - Asymptomatic Vitals:   10/13/23 1411  BP: 131/68  Pulse: 73  Resp: 18  Temp: (!) 97.4 F (36.3 C)   Filed Weights   10/13/23 1411  Weight: 184 lb 3.2 oz (83.6 kg)    Physical Exam Constitutional:      General: He is not in acute distress. HENT:     Head: Normocephalic and atraumatic.  Eyes:     General: No scleral icterus. Cardiovascular:     Rate and Rhythm: Normal rate and regular rhythm.  Pulmonary:     Effort: Pulmonary effort is normal. No respiratory distress.  Abdominal:     General: There is no distension.     Palpations: Abdomen is soft.  Musculoskeletal:        General: No deformity. Normal range of motion.     Cervical back: Normal range of motion and neck supple.  Skin:    Findings: No erythema or rash.  Neurological:     Mental Status: He is alert and oriented to person, place, and time. Mental status is at baseline.  Psychiatric:        Mood and Affect: Mood normal.     LABORATORY DATA:  I have reviewed the data as listed    Latest Ref Rng & Units 10/13/2023    1:46 PM 08/11/2023   10:57 AM 06/09/2023   10:56 AM  CBC  WBC 4.0 - 10.5 K/uL 8.0  7.5  7.2   Hemoglobin 13.0 - 17.0 g/dL 81.1  91.4  78.2   Hematocrit 39.0 - 52.0 % 41.5  43.2  41.4   Platelets 150 - 400 K/uL 212  225  202       Latest Ref Rng & Units 10/13/2023    1:46 PM 08/11/2023    10:57 AM 06/09/2023   10:56 AM  CMP  Glucose 70 - 99 mg/dL 92  956  213   BUN 8 - 23 mg/dL 22  28  25    Creatinine 0.61 - 1.24 mg/dL 0.86  5.78  4.69   Sodium 135 - 145 mmol/L 141  140  136   Potassium 3.5 - 5.1 mmol/L 4.3  4.3  4.3   Chloride 98 - 111 mmol/L 102  101  103   CO2 22 - 32 mmol/L 28  28  27    Calcium  8.9 - 10.3 mg/dL 9.4  9.7  9.1   Total Protein 6.5 - 8.1 g/dL 7.2  7.6  7.1   Total Bilirubin 0.0 - 1.2 mg/dL 0.9  0.8  0.8   Alkaline Phos 38 - 126 U/L 42  45  43   AST 15 - 41 U/L 23  21  18    ALT 0 - 44 U/L 20  17  15        RADIOGRAPHIC STUDIES: I have personally reviewed the radiological images as listed and agreed with the findings in the report. MR Lumbar Spine W Wo Contrast Result Date: 09/08/2023 CLINICAL DATA:  Back pain. EXAM: MRI LUMBAR SPINE WITHOUT AND WITH CONTRAST TECHNIQUE: Multiplanar and multiecho pulse sequences of the lumbar spine were obtained without and with intravenous contrast. CONTRAST:  7.5mL GADAVIST  GADOBUTROL  1 MMOL/ML IV SOLN COMPARISON:  PET-CT 03/14/2023. FINDINGS: Segmentation: Conventional numbering is assumed with 5 non-rib-bearing, lumbar type vertebral bodies.  Alignment:  Trace retrolisthesis of L1 on L2 and L3 on L4. Vertebrae: Normal vertebral body heights. Peripherally enhancing T2 hyperintense lesion in the right iliac crest, corresponding to metastasis seen on prior PET-CT. Modic type 1 degenerative endplate marrow signal changes at L2-3, L3-4 and L4-5. Conus medullaris and cauda equina: Conus extends to the L1 level. Conus and cauda equina appear normal. Paraspinal and other soft tissues: Unremarkable. Disc levels: T12-L1: Disc bulge and facet arthropathy results in mild spinal canal stenosis. L1-L2: Left eccentric disc bulge and facet arthropathy results mild spinal canal stenosis with compression of the traversing bilateral L2 nerve roots in the subarticular zones. L2-L3: Right eccentric disc bulge results in displacement of the traversing  right L3 nerve root in the subarticular zone. Facet arthropathy contributes to moderate right neural foraminal narrowing. L3-L4: Retrolisthesis, right eccentric disc bulge and facet arthropathy results in mild spinal canal stenosis with compression of the traversing right L4 nerve root in the subarticular zone, severe right and moderate left neural foraminal narrowing. L4-L5: Left eccentric disc bulge and facet arthropathy results in displacement of the traversing bilateral L5 nerve roots in the subarticular zones and moderate left neural foraminal narrowing. L5-S1: Disc bulge and facet arthropathy results in severe left and moderate right neural foraminal narrowing. No spinal canal stenosis. IMPRESSION: 1. Multilevel lumbar spondylosis, worst at L3-4, where there is mild spinal canal stenosis with compression of the traversing right L4 nerve root in the subarticular zone, severe right and moderate left neural foraminal narrowing. 2. Mild spinal canal stenosis at T12-L1 and L1-2. 3. Displacement of the traversing right L3 and bilateral L5 nerve roots in the subarticular zones at L2-3 and L4-5. 4. Severe left and moderate right neural foraminal narrowing at L5-S1. 5. Peripherally enhancing T2 hyperintense lesion in the right iliac crest, corresponding to metastasis seen on prior PET-CT. Electronically Signed   By: Audra Blend M.D.   On: 09/08/2023 17:11

## 2023-10-13 NOTE — Assessment & Plan Note (Addendum)
 Patient has  lower back pain.  MRI lumbar showed degenerative disease and known pelvic metastatic disease.  Avoid heavy weight lifting.

## 2023-10-13 NOTE — Assessment & Plan Note (Signed)
Eligard 45mg  Q6 months next due March 2025

## 2023-10-13 NOTE — Progress Notes (Signed)
 Pt here for follow up. No new concerns voiced.

## 2023-10-14 ENCOUNTER — Other Ambulatory Visit (HOSPITAL_COMMUNITY): Payer: Self-pay

## 2023-10-14 LAB — PSA: Prostatic Specific Antigen: 0.08 ng/mL (ref 0.00–4.00)

## 2023-10-15 ENCOUNTER — Other Ambulatory Visit (HOSPITAL_COMMUNITY): Payer: Self-pay

## 2023-10-15 ENCOUNTER — Encounter: Payer: Self-pay | Admitting: Oncology

## 2023-10-15 ENCOUNTER — Other Ambulatory Visit: Payer: Self-pay | Admitting: Oncology

## 2023-10-15 DIAGNOSIS — C61 Malignant neoplasm of prostate: Secondary | ICD-10-CM

## 2023-10-15 MED ORDER — PREDNISONE 5 MG PO TABS
5.0000 mg | ORAL_TABLET | Freq: Every day | ORAL | 1 refills | Status: DC
  Filled 2023-10-15: qty 30, 30d supply, fill #0
  Filled 2023-11-17: qty 30, 30d supply, fill #1

## 2023-10-15 NOTE — Progress Notes (Signed)
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

## 2023-10-20 ENCOUNTER — Other Ambulatory Visit (HOSPITAL_COMMUNITY): Payer: Self-pay

## 2023-10-20 ENCOUNTER — Other Ambulatory Visit: Payer: Self-pay

## 2023-10-20 ENCOUNTER — Encounter: Payer: Self-pay | Admitting: Oncology

## 2023-10-21 ENCOUNTER — Other Ambulatory Visit (HOSPITAL_COMMUNITY): Payer: Self-pay

## 2023-10-22 ENCOUNTER — Other Ambulatory Visit (HOSPITAL_COMMUNITY): Payer: Self-pay

## 2023-10-23 ENCOUNTER — Encounter: Payer: Self-pay | Admitting: Oncology

## 2023-10-27 ENCOUNTER — Ambulatory Visit: Payer: Medicare HMO | Attending: Cardiology | Admitting: Cardiology

## 2023-10-27 ENCOUNTER — Encounter: Payer: Self-pay | Admitting: Cardiology

## 2023-10-27 ENCOUNTER — Other Ambulatory Visit (HOSPITAL_COMMUNITY): Payer: Self-pay

## 2023-10-27 VITALS — BP 138/82 | HR 68 | Ht 68.0 in | Wt 187.8 lb

## 2023-10-27 DIAGNOSIS — I10 Essential (primary) hypertension: Secondary | ICD-10-CM

## 2023-10-27 DIAGNOSIS — R072 Precordial pain: Secondary | ICD-10-CM | POA: Diagnosis not present

## 2023-10-27 DIAGNOSIS — E782 Mixed hyperlipidemia: Secondary | ICD-10-CM

## 2023-10-27 MED ORDER — METOPROLOL TARTRATE 100 MG PO TABS
100.0000 mg | ORAL_TABLET | Freq: Once | ORAL | 0 refills | Status: DC
  Filled 2023-10-27 – 2024-02-15 (×2): qty 1, 1d supply, fill #0

## 2023-10-27 NOTE — Progress Notes (Signed)
 Cardiology Office Note:    Date:  10/27/2023   ID:  Christopher Wall, Christopher Wall, DOB Jan 31, 1946, MRN [id number]  PCP:  McDiarmid, Leighton Roach, MD   [facility] Providers Cardiologist:  Debbe Odea, MD     Referring MD: Rickard Patience, MD   No chief complaint on file.   History of Present Illness:    Christopher Wall, Christopher Wall is a 78 y.o. male with a hx of hypertension, hyperlipidemia, stage IV prostate cancer presenting with chest pain.  Endorse having chest pain over this last month or so.  Symptoms are not associated with exertion.  He was diagnosed with prostate cancer about 7 months ago, currently on hormonal therapy with good effect.  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  Unsure if this is contributing.    Denies any family history of heart attacks.  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  Past Medical History:  Diagnosis Date   Abdominal wall hernia 03/20/2018   ATTENTION DEFICIT, W/O HYPERACTIVITY 10/30/2006   Qualifier: History of  By: McDiarmid MD, Tawanna Cooler     Cataract    bilateral cataracts with lens implant   Chest pain, atypical 12/05/2017   COLON POLYP 02/07/2014   Tubular Adenoma x 1 - Dr Christella Hartigan (GI)   Diverticulosis of colon 07/09/2018   Based on colonoscopy 06/2108   Encounter for neuropsychological testing 06/26/2015   [facility]: Only B/L avg in rote memory auditory info encoding. No mood dysfnc   Encounter for neuropsychological testing 2008   [facility] (Neuropsy): Mixed anxiety and depression o/w normal.    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  Repeat testing 06/2015 at [facility]: Only finding was below average in rote memory auditory info encoding. No mood dysfunction.    Obesity (BMI 30.0-34.9) 10/30/2006   Qualifier: History of   By: McDiarmid MD, Todd       Overweight (BMI 25.0-29.9) 10/30/2006   Qualifier: History of  By: McDiarmid MD, Todd     Prediabetes 12/21/2013   Pulmonary nodule, left 06/09/2015   Pure hypercholesterolemia 10/30/2006   ACC calculation 10-yr risk of CV event of 9.5% with recommendation to consider Statin therapy of moderate-to-high potency (12/21/13)     Rotator cuff tear 12/08/2020   Shoulder injury, right, initial encounter 11/02/2020    Past Surgical History:  Procedure Laterality Date   CATARACT EXTRACTION W/ INTRAOCULAR LENS  IMPLANT, BILATERAL     COCCYGECTOMY  1985   VASECTOMY      Current Medications: Current Meds  Medication Sig   abiraterone acetate (ZYTIGA) 250 MG tablet Take 4 tablets (1,000 mg total) by mouth daily. Take on an empty stomach 1 hour before or 2 hours after a meal   lisinopril-hydrochlorothiazide (ZESTORETIC) 20-25 MG tablet Take 1 tablet by mouth daily.   metoprolol tartrate (LOPRESSOR) 100 MG tablet Take 1 tablet (100 mg total) by mouth once for 1 dose. TWO HOURS PRIOR TO CARDIAC CT   predniSONE (DELTASONE)  5 MG tablet Take 1 tablet (5 mg total) by mouth daily with breakfast.   Vibegron (GEMTESA) 75 MG TABS Take 1 tablet (75 mg total) by mouth daily.     Allergies:   Cephalosporins   Social History   Socioeconomic History   Marital status: Married    Spouse name: Darl Pikes   Number of children: 4   Years of education: >20   Highest education level: Not on file  Occupational History   Occupation: Education officer, community   Occupation: Pharmacist  Tobacco Use   Smoking status: Never   Smokeless tobacco: Never  Vaping Use   Vaping  status: Never Used  Substance and Sexual Activity   Alcohol use: Yes    Alcohol/week: 3.0 standard drinks of alcohol    Types: 3 drink(s) per week   Drug use: No   Sexual activity: Yes    Comment: monogamous  Other Topics Concern   Not on file  Social History Narrative   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   4 adult sons ([place], [place], [place]): 3 biological and 1 step-child   Patient is third of six children   No formal HCPOA executed (12/21/13)   No Advanced Directive executed (12/21/13)   He and wife are raising a granddaughter (2016)   Hobbies: Motorcycling and Horse riding.         Social Drivers of Corporate investment banker Strain: Not on file  Food Insecurity: No Food Insecurity (03/21/2023)   Hunger Vital Sign    Worried About Running Out of Food in the Last Year: Never true    Ran Out of Food in the Last Year: Never true  Transportation Needs: No Transportation Needs (03/21/2023)   PRAPARE - Administrator, Civil Service (Medical): No    Lack of Transportation (Non-Medical): No  Physical Activity: Not on file  Stress: Not on file  Social Connections: Not on file     Family History: The patient's family history includes Dementia (age of onset: 74) in his mother; Diabetes in his brother, brother, father, and sister; Esophageal cancer in his brother and brother; Hypertension in his mother; Lymphoma in his brother and brother; Stroke (age of onset: 76) in his mother. There is no history of Colon cancer.  ROS:   Please see the history of present illness.     All other systems reviewed and are negative.  EKGs/Labs/Other Studies Reviewed:    The following studies were reviewed today:  EKG Interpretation Date/Time:  Monday October 27 2023 08:43:58 EST Ventricular Rate:  68 PR Interval:  188 QRS Duration:  88 QT Interval:  416 QTC Calculation: 442 R Axis:   39  Text Interpretation: Normal sinus rhythm Normal ECG Confirmed by  Debbe Odea ([id number]) on 10/27/2023 8:48:12 AM    Recent Labs: 10/13/2023: ALT 20; BUN 22; Creatinine 0.95; Hemoglobin 13.7; Platelet Count 212; Potassium 4.3; Sodium 141  Recent Lipid Panel    Component Value Date/Time   CHOL 208 (H) 12/26/2022 1740   TRIG 172 (H) 12/26/2022 1740   HDL 67 12/26/2022 1740   CHOLHDL 3.1 12/26/2022 1740   CHOLHDL 4.8 06/08/2015 1229   VLDL 38 (H) 06/08/2015 1229   LDLCALC 111 (H) 12/26/2022 1740   LDLDIRECT 146 (H) 11/02/2020 1207     Risk Assessment/Calculations:             Physical Exam:    VS:  BP 138/82   Pulse 68   Ht  5\' 8"  (1.727 m)   Wt 187 lb 12.8 oz (85.2 kg)   SpO2 97%   BMI 28.55 kg/m     Wt Readings from Last 3 Encounters:  10/27/23 187 lb 12.8 oz (85.2 kg)  10/13/23 184 lb 3.2 oz (83.6 kg)  08/15/23 180 lb (81.6 kg)     GEN:  Well nourished, well developed in no acute distress HEENT: Normal NECK: No JVD; No carotid bruits CARDIAC: RRR, no murmurs, rubs, gallops RESPIRATORY:  Clear to auscultation without rales, wheezing or rhonchi  ABDOMEN: Soft, non-tender, non-distended MUSCULOSKELETAL:  No edema; No deformity  SKIN: Warm and dry NEUROLOGIC:  Alert and oriented x 3 PSYCHIATRIC:  Normal affect   ASSESSMENT:    1. Precordial pain   2. Primary hypertension   3. Mixed hyperlipidemia    PLAN:    In order of problems listed above:  Chest pain, risk factors hypertension, hyperlipidemia.  Get echocardiogram, get coronary CTA.  Bone mets to sternum could be in etiology. Hypertension, BP controlled.  Continue lisinopril-HCTZ 20-25 mg daily. Hyperlipidemia, repeat lipid panel.  Consider statin based on lipid panel results and CTA.  Follow-up after cardiac testing     Medication Adjustments/Labs and Tests Ordered: Current medicines are reviewed at length with the patient today.  Concerns regarding medicines are outlined above.  Orders Placed This Encounter  Procedures   CT CORONARY MORPH W/CTA COR W/SCORE  W/CA W/CM &/OR WO/CM   Basic Metabolic Panel (BMET)   Lipid panel   EKG 12-Lead   ECHOCARDIOGRAM COMPLETE   Meds ordered this encounter  Medications   metoprolol tartrate (LOPRESSOR) 100 MG tablet    Sig: Take 1 tablet (100 mg total) by mouth once for 1 dose. TWO HOURS PRIOR TO CARDIAC CT    Dispense:  1 tablet    Refill:  0    Patient Instructions  Medication Instructions:   Your physician recommends that you continue on your current medications as directed. Please refer to the Current Medication list given to you today.  *If you need a refill on your cardiac medications before your next appointment, please call your pharmacy*   Lab Work:  Your physician recommends you have labs - BMP   Your provider would like for you to return the day of your ECHOCARDIOGRAM to have the following labs drawn: LIPID.   Please go to [facility] [address] ([facility]) [address], [place] [address] You do not need an appointment.  They are open from 8 am- 4:30 pm.  Lunch from 1:00 pm- 2:00 pm You WILL need to be fasting.   If you have labs (blood work) drawn today and your tests are completely normal, you will receive your results only by: MyChart Message (if you have MyChart) OR A paper copy in the mail If you have any lab test that is abnormal or we need to change your treatment, we will call you to review the results.   Testing/Procedures:  Your physician has requested that you have an echocardiogram - please schedule for an AM appt. Echocardiography is a painless test that uses sound waves to create images of your heart. It provides your doctor with information about the size and shape of your heart and how well your heart's chambers and valves are working. This procedure takes approximately one hour. There are no restrictions for this procedure. Please do NOT wear cologne, perfume, aftershave, or lotions (deodorant is allowed). Please arrive 15 minutes prior to  your appointment  time.  Please note: We ask at that you not bring children with you during ultrasound (echo/ vascular) testing. Due to room size and safety concerns, children are not allowed in the ultrasound rooms during exams. Our front office staff cannot provide observation of children in our lobby area while testing is being conducted. An adult accompanying a patient to their appointment will only be allowed in the ultrasound room at the discretion of the ultrasound technician under special circumstances. We apologize for any inconvenience.   Your cardiac CT will be scheduled at one of the below locations:   [facility] [address] [place], [place] [address] [phone]  OR   [facility] [address] [place], [place] [address] [phone]   If scheduled at [facility] or [facility], please arrive 15 mins early for check-in and test prep.  There is spacious parking and easy access to the radiology department from the [facility] Heart and Vascular entrance. Please enter here and check-in with the desk attendant.   If scheduled at [facility], please arrive 30 minutes early for check-in and test prep.  Please follow these instructions carefully (unless otherwise directed):  An IV will be required for this test and Nitroglycerin will be given.  Hold all erectile dysfunction medications at least 3 days (72 hrs) prior to test. (Ie viagra, cialis, sildenafil, tadalafil, etc)   On the Night Before the Test: Be sure to Drink plenty of water. Do not consume any caffeinated/decaffeinated beverages or chocolate 12 hours prior to your test. Do not take any antihistamines 12 hours prior to your test.  On the Day of the Test: Drink plenty of water until 1 hour prior to the test. Do not eat any food 1 hour prior to test. Take metoprolol (Lopressor) two hours  prior to test. HOLD lisinopril-hydrochlorothiazide (ZESTORETIC) 20-25 MG tablet the morning of your Cardiac CT Patients who wear a continuous glucose monitor MUST remove the device prior to scanning.        After the Test: Drink plenty of water. After receiving IV contrast, you may experience a mild flushed feeling. This is normal. On occasion, you may experience a mild rash up to 24 hours after the test. This is not dangerous. If this occurs, you can take Benadryl 25 mg, Zyrtec, Claritin, or Allegra and increase your fluid intake. (Patients taking Tikosyn should avoid Benadryl, and may take Zyrtec, Claritin, or Allegra) If you experience trouble breathing, this can be serious. If it is severe call 911 IMMEDIATELY. If it is mild, please call our office.  We will call to schedule your test 2-4 weeks out understanding that some insurance companies will need an authorization prior to the service being performed.   For more information and frequently asked questions, please visit our website : [email or web address]  For non-scheduling related questions, please contact the cardiac imaging nurse navigator should you have any questions/concerns: Cardiac Imaging Nurse Navigators Direct Office Dial: [phone]   For scheduling needs, including cancellations and rescheduling, please call [place], [phone].   Follow-Up: At [facility], you and your health needs are our priority.  As part of our continuing mission to provide you with exceptional heart care, we have created designated Provider Care Teams.  These Care Teams include your primary Cardiologist (physician) and Advanced Practice Providers (APPs -  Physician Assistants and Nurse Practitioners) who all work together to provide you with the care you need,  when you need it.  We recommend signing up for the patient portal called "MyChart".  Sign up information is provided on this After Visit Summary.  MyChart is used to  connect with patients for Virtual Visits (Telemedicine).  Patients are able to view lab/test results, encounter notes, upcoming appointments, etc.  Non-urgent messages can be sent to your provider as well.   To learn more about what you can do with MyChart, go to [email or web address].    Your next appointment:   8 week(s)  Provider:   You may see Debbe Odea, MD or one of the following Advanced Practice Providers on your designated Care Team:   Nicolasa Ducking, NP Eula Listen, PA-C Cadence Fransico Michael, PA-C Charlsie Quest, NP Carlos Levering, NP    Signed, Debbe Odea, MD  10/27/2023 9:28 AM     [facility]

## 2023-10-27 NOTE — Patient Instructions (Signed)
 Medication Instructions:   Your physician recommends that you continue on your current medications as directed. Please refer to the Current Medication list given to you today.  *If you need a refill on your cardiac medications before your next appointment, please call your pharmacy*   Lab Work:  Your physician recommends you have labs - BMP   Your provider would like for you to return the day of your ECHOCARDIOGRAM to have the following labs drawn: LIPID.   Please go to [facility] [address] ([facility]) [address], [place] [address] You do not need an appointment.  They are open from 8 am- 4:30 pm.  Lunch from 1:00 pm- 2:00 pm You WILL need to be fasting.   If you have labs (blood work) drawn today and your tests are completely normal, you will receive your results only by: MyChart Message (if you have MyChart) OR A paper copy in the mail If you have any lab test that is abnormal or we need to change your treatment, we will call you to review the results.   Testing/Procedures:  Your physician has requested that you have an echocardiogram - please schedule for an AM appt. Echocardiography is a painless test that uses sound waves to create images of your heart. It provides your doctor with information about the size and shape of your heart and how well your heart's chambers and valves are working. This procedure takes approximately one hour. There are no restrictions for this procedure. Please do NOT wear cologne, perfume, aftershave, or lotions (deodorant is allowed). Please arrive 15 minutes prior to your appointment time.  Please note: We ask at that you not bring children with you during ultrasound (echo/ vascular) testing. Due to room size and safety concerns, children are not allowed in the ultrasound rooms during exams. Our front office staff cannot provide observation of children in our lobby area while testing is being conducted. An adult accompanying  a patient to their appointment will only be allowed in the ultrasound room at the discretion of the ultrasound technician under special circumstances. We apologize for any inconvenience.   Your cardiac CT will be scheduled at one of the below locations:   [facility] [address] [place], [place] [address] [phone]  OR   [facility] [address] [place], [place] [address] [phone]   If scheduled at [facility] or [facility], please arrive 15 mins early for check-in and test prep.  There is spacious parking and easy access to the radiology department from the [facility] Heart and Vascular entrance. Please enter here and check-in with the desk attendant.   If scheduled at [facility], please arrive 30 minutes early for check-in and test prep.  Please follow these instructions carefully (unless otherwise directed):  An IV will be required for this test and Nitroglycerin will be given.  Hold all erectile dysfunction medications at least 3 days (72 hrs) prior to test. (Ie viagra, cialis, sildenafil, tadalafil, etc)   On the Night Before the Test: Be sure to Drink plenty of water. Do not consume any caffeinated/decaffeinated beverages or chocolate 12 hours prior to your test. Do not take any antihistamines 12 hours prior to your test.  On the Day of the Test: Drink plenty of water until 1 hour prior to the test. Do not eat any food 1 hour prior to test. Take metoprolol (Lopressor) two hours prior to  test. HOLD lisinopril-hydrochlorothiazide (ZESTORETIC) 20-25 MG tablet the morning of your Cardiac CT Patients who wear a continuous glucose monitor MUST remove the device prior to scanning.        After the Test: Drink plenty of water. After receiving IV contrast, you may experience a mild flushed feeling. This is normal. On occasion, you may  experience a mild rash up to 24 hours after the test. This is not dangerous. If this occurs, you can take Benadryl 25 mg, Zyrtec, Claritin, or Allegra and increase your fluid intake. (Patients taking Tikosyn should avoid Benadryl, and may take Zyrtec, Claritin, or Allegra) If you experience trouble breathing, this can be serious. If it is severe call 911 IMMEDIATELY. If it is mild, please call our office.  We will call to schedule your test 2-4 weeks out understanding that some insurance companies will need an authorization prior to the service being performed.   For more information and frequently asked questions, please visit our website : [email or web address]  For non-scheduling related questions, please contact the cardiac imaging nurse navigator should you have any questions/concerns: Cardiac Imaging Nurse Navigators Direct Office Dial: [phone]   For scheduling needs, including cancellations and rescheduling, please call [place], [phone].   Follow-Up: At [facility], you and your health needs are our priority.  As part of our continuing mission to provide you with exceptional heart care, we have created designated Provider Care Teams.  These Care Teams include your primary Cardiologist (physician) and Advanced Practice Providers (APPs -  Physician Assistants and Nurse Practitioners) who all work together to provide you with the care you need, when you need it.  We recommend signing up for the patient portal called "MyChart".  Sign up information is provided on this After Visit Summary.  MyChart is used to connect with patients for Virtual Visits (Telemedicine).  Patients are able to view lab/test results, encounter notes, upcoming appointments, etc.  Non-urgent messages can be sent to your provider as well.   To learn more about what you can do with MyChart, go to [email or web address].    Your next appointment:   8 week(s)  Provider:   You may see  Debbe Odea, MD or one of the following Advanced Practice Providers on your designated Care Team:   Nicolasa Ducking, NP Eula Listen, PA-C Cadence Fransico Michael, PA-C Charlsie Quest, NP Carlos Levering, NP

## 2023-10-28 ENCOUNTER — Encounter: Payer: Self-pay | Admitting: Oncology

## 2023-10-28 LAB — BASIC METABOLIC PANEL
BUN/Creatinine Ratio: 25 — ABNORMAL HIGH (ref 10–24)
BUN: 26 mg/dL (ref 8–27)
CO2: 25 mmol/L (ref 20–29)
Calcium: 9.7 mg/dL (ref 8.6–10.2)
Chloride: 101 mmol/L (ref 96–106)
Creatinine, Ser: 1.02 mg/dL (ref 0.76–1.27)
Glucose: 105 mg/dL — ABNORMAL HIGH (ref 70–99)
Potassium: 4.8 mmol/L (ref 3.5–5.2)
Sodium: 142 mmol/L (ref 134–144)
eGFR: 76 mL/min/{1.73_m2} (ref >59)

## 2023-11-06 ENCOUNTER — Other Ambulatory Visit (HOSPITAL_COMMUNITY): Payer: Self-pay

## 2023-11-10 ENCOUNTER — Other Ambulatory Visit (HOSPITAL_COMMUNITY): Payer: Self-pay

## 2023-11-11 ENCOUNTER — Other Ambulatory Visit (HOSPITAL_COMMUNITY): Payer: Self-pay

## 2023-11-11 ENCOUNTER — Other Ambulatory Visit: Payer: Self-pay

## 2023-11-11 ENCOUNTER — Encounter: Payer: Self-pay | Admitting: Oncology

## 2023-11-11 NOTE — Progress Notes (Signed)
 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

## 2023-11-12 ENCOUNTER — Other Ambulatory Visit (HOSPITAL_COMMUNITY): Payer: Self-pay

## 2023-11-12 ENCOUNTER — Other Ambulatory Visit: Payer: Self-pay

## 2023-11-12 ENCOUNTER — Encounter: Payer: Self-pay | Admitting: Oncology

## 2023-11-14 ENCOUNTER — Inpatient Hospital Stay: Payer: Medicare HMO | Attending: Oncology

## 2023-11-14 ENCOUNTER — Ambulatory Visit: Payer: Medicare HMO

## 2023-11-14 ENCOUNTER — Encounter: Payer: Self-pay | Admitting: Oncology

## 2023-11-14 DIAGNOSIS — C61 Malignant neoplasm of prostate: Secondary | ICD-10-CM | POA: Diagnosis not present

## 2023-11-14 DIAGNOSIS — C7951 Secondary malignant neoplasm of bone: Secondary | ICD-10-CM | POA: Diagnosis not present

## 2023-11-14 MED ORDER — LEUPROLIDE ACETATE (6 MONTH) 45 MG ~~LOC~~ KIT
45.0000 mg | PACK | Freq: Once | SUBCUTANEOUS | Status: AC
  Administered 2023-11-14: 45 mg via SUBCUTANEOUS
  Filled 2023-11-14: qty 45

## 2023-11-24 ENCOUNTER — Ambulatory Visit: Payer: Medicare HMO | Attending: Cardiology

## 2023-11-24 DIAGNOSIS — R072 Precordial pain: Secondary | ICD-10-CM

## 2023-11-24 LAB — ECHOCARDIOGRAM COMPLETE
AR max vel: 2.8 cm2
AV Area VTI: 2.73 cm2
AV Area mean vel: 2.57 cm2
AV Mean grad: 6 mmHg
AV Peak grad: 11.7 mmHg
AV Vena cont: 0.4 cm
Ao pk vel: 1.71 m/s
Area-P 1/2: 2.91 cm2
Calc EF: 56.4 %
P 1/2 time: 427 ms
S' Lateral: 3.1 cm
Single Plane A2C EF: 59.8 %
Single Plane A4C EF: 54.2 %

## 2023-11-28 ENCOUNTER — Telehealth (HOSPITAL_COMMUNITY): Payer: Self-pay | Admitting: *Deleted

## 2023-11-28 ENCOUNTER — Encounter (HOSPITAL_COMMUNITY): Payer: Self-pay

## 2023-11-28 NOTE — Telephone Encounter (Signed)
 Reaching out to patient to offer assistance regarding upcoming cardiac imaging study; pt verbalizes understanding of appt date/time, parking situation and where to check in, pre-test NPO status and medications ordered, and verified current allergies; name and call back number provided for further questions should they arise  Larey Brick RN Navigator Cardiac Imaging [facility] [phone] office [phone] cell  Patient to take 100mg  metoprolol tartrate two hours prior to his cardiac CT scan.

## 2023-12-01 ENCOUNTER — Telehealth: Payer: Self-pay | Admitting: *Deleted

## 2023-12-01 ENCOUNTER — Ambulatory Visit
Admission: RE | Admit: 2023-12-01 | Discharge: 2023-12-01 | Disposition: A | Discharge status: Home / Self Care | Source: Ambulatory Visit | Attending: Cardiology | Admitting: Cardiology

## 2023-12-01 ENCOUNTER — Other Ambulatory Visit: Payer: Self-pay | Admitting: Cardiology

## 2023-12-01 DIAGNOSIS — R072 Precordial pain: Secondary | ICD-10-CM

## 2023-12-01 DIAGNOSIS — R931 Abnormal findings on diagnostic imaging of heart and coronary circulation: Secondary | ICD-10-CM

## 2023-12-01 MED ORDER — IOHEXOL 350 MG/ML SOLN
80.0000 mL | Freq: Once | INTRAVENOUS | Status: AC | PRN
  Administered 2023-12-01: 80 mL via INTRAVENOUS

## 2023-12-01 MED ORDER — METOPROLOL TARTRATE 5 MG/5ML IV SOLN
10.0000 mg | Freq: Once | INTRAVENOUS | Status: DC | PRN
  Filled 2023-12-01: qty 10

## 2023-12-01 MED ORDER — DILTIAZEM HCL 25 MG/5ML IV SOLN
10.0000 mg | INTRAVENOUS | Status: DC | PRN
  Filled 2023-12-01: qty 5

## 2023-12-01 MED ORDER — NITROGLYCERIN 0.4 MG SL SUBL
0.8000 mg | SUBLINGUAL_TABLET | Freq: Once | SUBLINGUAL | Status: AC
  Administered 2023-12-01: 0.8 mg via SUBLINGUAL
  Filled 2023-12-01: qty 25

## 2023-12-01 MED ORDER — NITROGLYCERIN 0.4 MG SL SUBL
SUBLINGUAL_TABLET | SUBLINGUAL | Status: AC
  Filled 2023-12-01: qty 2

## 2023-12-01 NOTE — Telephone Encounter (Signed)
 Left voicemail message to call back for review of results and to schedule appointment.

## 2023-12-01 NOTE — Telephone Encounter (Signed)
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  Start aspirin 81 mg daily, Lipitor 40 mg daily.  Schedule follow-up appointment, patient will need left heart cath.

## 2023-12-08 ENCOUNTER — Other Ambulatory Visit: Payer: Self-pay

## 2023-12-08 ENCOUNTER — Ambulatory Visit: Payer: Medicare HMO | Attending: Cardiology | Admitting: Cardiology

## 2023-12-08 ENCOUNTER — Encounter: Payer: Self-pay | Admitting: Oncology

## 2023-12-08 ENCOUNTER — Other Ambulatory Visit (HOSPITAL_COMMUNITY): Payer: Self-pay

## 2023-12-08 ENCOUNTER — Other Ambulatory Visit: Payer: Self-pay | Admitting: Oncology

## 2023-12-08 ENCOUNTER — Encounter: Payer: Self-pay | Admitting: Cardiology

## 2023-12-08 VITALS — BP 128/74 | HR 69 | Ht 68.0 in | Wt 187.6 lb

## 2023-12-08 DIAGNOSIS — I10 Essential (primary) hypertension: Secondary | ICD-10-CM

## 2023-12-08 DIAGNOSIS — I2510 Atherosclerotic heart disease of native coronary artery without angina pectoris: Secondary | ICD-10-CM

## 2023-12-08 DIAGNOSIS — C61 Malignant neoplasm of prostate: Secondary | ICD-10-CM

## 2023-12-08 DIAGNOSIS — E782 Mixed hyperlipidemia: Secondary | ICD-10-CM

## 2023-12-08 MED ORDER — ATORVASTATIN CALCIUM 40 MG PO TABS
40.0000 mg | ORAL_TABLET | Freq: Every day | ORAL | 3 refills | Status: AC
  Filled 2023-12-08: qty 90, 90d supply, fill #0
  Filled 2024-03-06: qty 90, 90d supply, fill #1
  Filled 2024-06-04: qty 90, 90d supply, fill #2

## 2023-12-08 MED ORDER — ASPIRIN 81 MG PO TBEC: 81.0000 mg | DELAYED_RELEASE_TABLET | Freq: Every day | ORAL | 3 refills | Status: AC

## 2023-12-08 NOTE — Progress Notes (Signed)
 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  Previously seen due to symptoms of chest pain, coronary CTA obtained to evaluate cardiac etiology.  He still has occasional chest pressure.  Not sure if his symptoms are secondary to bony mets from prostate cancer.  Presents for cardiac testing results.   Past Medical History:  Diagnosis Date   Abdominal wall hernia 03/20/2018   ATTENTION DEFICIT, W/O HYPERACTIVITY 10/30/2006   Qualifier: History of  By: McDiarmid MD, Tawanna Cooler     Cataract    bilateral cataracts with lens implant   Chest pain, atypical 12/05/2017   COLON POLYP 02/07/2014   Tubular Adenoma x 1 - Dr Christella Hartigan (GI)   Diverticulosis of colon 07/09/2018   Based on colonoscopy 06/2108   Encounter for neuropsychological testing 06/26/2015   [facility]: Only B/L avg in rote memory auditory info encoding. No mood dysfnc   Encounter for neuropsychological testing 2008   [facility] (Neuropsy): Mixed anxiety and depression o/w normal.    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  Repeat  testing 06/2015 at [facility]: Only finding was below average in rote memory auditory info encoding. No mood dysfunction.    Obesity (BMI 30.0-34.9) 10/30/2006   Qualifier: History of   By: McDiarmid MD, Todd       Overweight (BMI 25.0-29.9) 10/30/2006   Qualifier: History of  By: McDiarmid MD, Todd     Prediabetes 12/21/2013   Pulmonary nodule, left 06/09/2015   Pure hypercholesterolemia 10/30/2006   ACC calculation 10-yr risk of CV event of 9.5% with recommendation to consider Statin therapy of moderate-to-high potency (12/21/13)     Rotator cuff tear 12/08/2020   Shoulder injury, right, initial encounter 11/02/2020    Past Surgical History:  Procedure Laterality Date   CATARACT EXTRACTION W/ INTRAOCULAR LENS  IMPLANT, BILATERAL     COCCYGECTOMY  1985   VASECTOMY      Current Medications: Current Meds  Medication Sig   abiraterone acetate (ZYTIGA) 250 MG tablet Take 4 tablets (1,000 mg total) by mouth daily. Take on an empty stomach 1 hour before or 2 hours after a meal   aspirin EC 81 MG tablet Take 1 tablet (81 mg total) by mouth daily. Swallow whole.   atorvastatin (LIPITOR) 40 MG tablet Take 1 tablet (40 mg total) by mouth daily.   lisinopril-hydrochlorothiazide (ZESTORETIC) 20-25 MG tablet Take 1 tablet by mouth daily.   predniSONE (DELTASONE) 5 MG tablet Take 1 tablet (5 mg total) by mouth daily with breakfast.   Vibegron (GEMTESA) 75  MG TABS Take 1 tablet (75 mg total) by mouth daily.     Allergies:   Cephalosporins   Social History   Socioeconomic History   Marital status: Married    Spouse name: Darl Pikes   Number of children: 4   Years of education: >20   Highest education level: Not on file  Occupational History   Occupation: Education officer, community   Occupation: Pharmacist  Tobacco Use   Smoking status: Never   Smokeless tobacco: Never  Vaping Use   Vaping status: Never Used  Substance and Sexual Activity   Alcohol use: Yes    Alcohol/week: 3.0 standard  drinks of alcohol    Types: 3 drink(s) per week   Drug use: No   Sexual activity: Yes    Comment: monogamous  Other Topics Concern   Not on file  Social History Narrative   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   4 adult sons ([place], [place], [place]): 3 biological and 1 step-child   Patient is third of six children   No formal HCPOA executed (12/21/13)   No Advanced Directive executed (12/21/13)   He and wife are raising a granddaughter (2016)   Hobbies: Motorcycling and Horse riding.         Social Drivers of Corporate investment banker Strain: Not on file  Food Insecurity: No Food Insecurity (03/21/2023)   Hunger Vital Sign    Worried About Running Out of Food in the Last Year: Never true    Ran Out of Food in the Last Year: Never true  Transportation Needs: No Transportation Needs (03/21/2023)   PRAPARE - Administrator, Civil Service (Medical): No    Lack of Transportation (Non-Medical): No  Physical Activity: Not on file  Stress: Not on file  Social Connections: Not on file     Family History: The patient's family history includes Dementia (age of onset: 60) in his mother; Diabetes in his brother, brother, father, and sister; Esophageal cancer in his brother and brother; Hypertension in his mother; Lymphoma in his brother and brother; Stroke (age of onset: 69) in his mother. There is no history of Colon cancer.  ROS:   Please see the history of present illness.     All other systems reviewed and are negative.  EKGs/Labs/Other Studies Reviewed:    The following studies were reviewed today:  EKG Interpretation Date/Time:  Monday December 08 2023 09:08:15 EDT Ventricular Rate:  69 PR Interval:  200 QRS Duration:  90 QT Interval:  408 QTC Calculation: 437 R Axis:   36  Text Interpretation: Normal sinus rhythm Normal ECG Confirmed by Debbe Odea ([id number]) on 12/08/2023 9:15:10 AM    Recent Labs: 10/13/2023: ALT 20; Hemoglobin 13.7; Platelet  Count 212 10/27/2023: BUN 26; Creatinine, Ser 1.02; Potassium 4.8; Sodium 142  Recent Lipid Panel    Component Value Date/Time   CHOL 208 (H) 12/26/2022 1740   TRIG 172 (H) 12/26/2022 1740   HDL 67 12/26/2022 1740   CHOLHDL 3.1 12/26/2022 1740   CHOLHDL 4.8 06/08/2015 1229   VLDL 38 (H) 06/08/2015 1229   LDLCALC 111 (H) 12/26/2022 1740   LDLDIRECT 146 (H) 11/02/2020 1207     Risk Assessment/Calculations:             Physical Exam:    VS:  BP 128/74   Pulse 69   Ht 5\' 8"  (1.727 m)   Wt 187 lb 9.6 oz (85.1 kg)   SpO2 94%  BMI 28.52 kg/m     Wt Readings from Last 3 Encounters:  12/08/23 187 lb 9.6 oz (85.1 kg)  10/27/23 187 lb 12.8 oz (85.2 kg)  10/13/23 184 lb 3.2 oz (83.6 kg)     GEN:  Well nourished, well developed in no acute distress HEENT: Normal NECK: No JVD; No carotid bruits CARDIAC: RRR, no murmurs, rubs, gallops RESPIRATORY:  Clear to auscultation without rales, wheezing or rhonchi  ABDOMEN: Soft, non-tender, non-distended MUSCULOSKELETAL:  No edema; No deformity  SKIN: Warm and dry NEUROLOGIC:  Alert and oriented x 3 PSYCHIATRIC:  Normal affect   ASSESSMENT:    1. Coronary artery disease involving native coronary artery of native heart, unspecified whether angina present   2. Primary hypertension   3. Mixed hyperlipidemia    PLAN:    In order of problems listed above:  Chest pain, CCTA 11/2023 calcium score 3577, severe proximal LAD stenosis, moderate RCA stenosis.  FFR CT showing significant LAD stenosis, OM1 stenosis.  EF 60 to 65%. start aspirin 81 mg daily, Lipitor 40 mg daily.  He still has occasional chest pain.  Not sure if this is due to bony mets.  Would like to discuss his cancer prognosis with oncologist prior to deciding on left heart cath.  I think this is very reasonable.  Will schedule left heart cath when patient decides to proceed.  Patient will like to hold off on beta-blocker at this time. Hypertension, BP controlled.  Continue  lisinopril-HCTZ 20-25 mg daily. Hyperlipidemia, start Lipitor 40 mg daily.  Follow-up 8 weeks.     Medication Adjustments/Labs and Tests Ordered: Current medicines are reviewed at length with the patient today.  Concerns regarding medicines are outlined above.  Orders Placed This Encounter  Procedures   EKG 12-Lead   Meds ordered this encounter  Medications   aspirin EC 81 MG tablet    Sig: Take 1 tablet (81 mg total) by mouth daily. Swallow whole.    Dispense:  90 tablet    Refill:  3   atorvastatin (LIPITOR) 40 MG tablet    Sig: Take 1 tablet (40 mg total) by mouth daily.    Dispense:  90 tablet    Refill:  3    Patient Instructions  Medication Instructions:  Your physician recommends the following medication changes.  START TAKING: Aspirin 81 mg daily Lipitor 40 mg daily  *If you need a refill on your cardiac medications before your next appointment, please call your pharmacy*  Lab Work: No labs ordered today.  Testing/Procedures: No test ordered today   Follow-Up: At [facility], you and your health needs are our priority.  As part of our continuing mission to provide you with exceptional heart care, our providers are all part of one team.  This team includes your primary Cardiologist (physician) and Advanced Practice Providers or APPs (Physician Assistants and Nurse Practitioners) who all work together to provide you with the care you need, when you need it.  Your next appointment:   2-3 month(s)  Provider:   You may see Debbe Odea, MD or one of the following Advanced Practice Providers on your designated Care Team:   Nicolasa Ducking, NP Ames Dura, PA-C Eula Listen, PA-C Cadence Ladora, PA-C Charlsie Quest, NP Carlos Levering, NP    We recommend signing up for the patient portal called "MyChart".  Sign up information is provided on this After Visit Summary.  MyChart is used to connect with patients for Virtual Visits (Telemedicine).  Patients are able to view lab/test results, encounter notes, upcoming appointments, etc.  Non-urgent messages can be sent to your provider as well.   To learn more about what you can do with MyChart, go to [email or web address].            Signed, Debbe Odea, MD  12/08/2023 10:27 AM    [facility]

## 2023-12-08 NOTE — Progress Notes (Signed)
 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

## 2023-12-08 NOTE — Progress Notes (Signed)
 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   Therapeutic benefit summary: Patient is achieving benefit   Adverse events/side effects summary: No adverse events/side effects   Patient's therapy is appropriate to: Continue    Goals Addressed             This Visit's Progress    Slow Disease Progression       Patient is on track. Patient will maintain adherence. PSA has decreased to 0.08 as of 10/13/23.          Follow up:  3 months  Otto Herb Specialty Pharmacist

## 2023-12-08 NOTE — Patient Instructions (Signed)
 Medication Instructions:  Your physician recommends the following medication changes.  START TAKING: Aspirin 81 mg daily Lipitor 40 mg daily  *If you need a refill on your cardiac medications before your next appointment, please call your pharmacy*  Lab Work: No labs ordered today.  Testing/Procedures: No test ordered today   Follow-Up: At [facility], you and your health needs are our priority.  As part of our continuing mission to provide you with exceptional heart care, our providers are all part of one team.  This team includes your primary Cardiologist (physician) and Advanced Practice Providers or APPs (Physician Assistants and Nurse Practitioners) who all work together to provide you with the care you need, when you need it.  Your next appointment:   2-3 month(s)  Provider:   You may see Debbe Odea, MD or one of the following Advanced Practice Providers on your designated Care Team:   Nicolasa Ducking, NP Ames Dura, PA-C Eula Listen, PA-C Cadence Wheeler, PA-C Charlsie Quest, NP Carlos Levering, NP    We recommend signing up for the patient portal called "MyChart".  Sign up information is provided on this After Visit Summary.  MyChart is used to connect with patients for Virtual Visits (Telemedicine).  Patients are able to view lab/test results, encounter notes, upcoming appointments, etc.  Non-urgent messages can be sent to your provider as well.   To learn more about what you can do with MyChart, go to [email or web address].

## 2023-12-10 ENCOUNTER — Other Ambulatory Visit (HOSPITAL_COMMUNITY): Payer: Self-pay

## 2023-12-10 ENCOUNTER — Other Ambulatory Visit: Payer: Self-pay

## 2023-12-11 ENCOUNTER — Encounter: Payer: Self-pay | Admitting: Oncology

## 2023-12-12 ENCOUNTER — Other Ambulatory Visit (HOSPITAL_COMMUNITY): Payer: Self-pay

## 2023-12-15 ENCOUNTER — Ambulatory Visit: Payer: Medicare HMO | Admitting: Oncology

## 2023-12-15 ENCOUNTER — Other Ambulatory Visit: Payer: Medicare HMO

## 2023-12-15 NOTE — Telephone Encounter (Signed)
 Spoke with patient and he said that he had consultation last week. No further needs,   Appointment with provider last week.

## 2023-12-18 ENCOUNTER — Other Ambulatory Visit: Payer: Self-pay

## 2023-12-18 ENCOUNTER — Encounter: Payer: Self-pay | Admitting: Oncology

## 2023-12-18 ENCOUNTER — Other Ambulatory Visit (HOSPITAL_COMMUNITY): Payer: Self-pay

## 2023-12-18 MED ORDER — PREDNISONE 5 MG PO TABS
5.0000 mg | ORAL_TABLET | Freq: Every day | ORAL | 1 refills | Status: DC
Start: 2023-12-18 — End: 2023-12-25
  Filled 2023-12-18: qty 30, 30d supply, fill #0

## 2023-12-25 ENCOUNTER — Other Ambulatory Visit (HOSPITAL_COMMUNITY): Payer: Self-pay

## 2023-12-25 ENCOUNTER — Inpatient Hospital Stay: Payer: Medicare HMO | Admitting: Oncology

## 2023-12-25 ENCOUNTER — Encounter: Payer: Self-pay | Admitting: Oncology

## 2023-12-25 ENCOUNTER — Inpatient Hospital Stay: Payer: Medicare HMO | Attending: Oncology

## 2023-12-25 ENCOUNTER — Encounter: Payer: Medicare HMO | Admitting: Licensed Clinical Social Worker

## 2023-12-25 VITALS — BP 138/74 | HR 68 | Temp 97.3°F | Resp 18 | Wt 186.2 lb

## 2023-12-25 DIAGNOSIS — Z79899 Other long term (current) drug therapy: Secondary | ICD-10-CM | POA: Insufficient documentation

## 2023-12-25 DIAGNOSIS — C61 Malignant neoplasm of prostate: Secondary | ICD-10-CM

## 2023-12-25 DIAGNOSIS — C7951 Secondary malignant neoplasm of bone: Secondary | ICD-10-CM | POA: Insufficient documentation

## 2023-12-25 DIAGNOSIS — Z7982 Long term (current) use of aspirin: Secondary | ICD-10-CM | POA: Diagnosis not present

## 2023-12-25 LAB — CMP (CANCER CENTER ONLY)
ALT: 19 U/L (ref 0–44)
AST: 24 U/L (ref 15–41)
Albumin: 4.2 g/dL (ref 3.5–5.0)
Alkaline Phosphatase: 37 U/L — ABNORMAL LOW (ref 38–126)
Anion gap: 9 (ref 5–15)
BUN: 22 mg/dL (ref 8–23)
CO2: 28 mmol/L (ref 22–32)
Calcium: 9.5 mg/dL (ref 8.9–10.3)
Chloride: 102 mmol/L (ref 98–111)
Creatinine: 0.96 mg/dL (ref 0.61–1.24)
GFR, Estimated: >60 mL/min (ref >60)
Glucose, Bld: 113 mg/dL — ABNORMAL HIGH (ref 70–99)
Potassium: 3.6 mmol/L (ref 3.5–5.1)
Sodium: 139 mmol/L (ref 135–145)
Total Bilirubin: 1.1 mg/dL (ref 0.0–1.2)
Total Protein: 7.2 g/dL (ref 6.5–8.1)

## 2023-12-25 LAB — CBC WITH DIFFERENTIAL (CANCER CENTER ONLY)
Abs Immature Granulocytes: 0.02 10*3/uL (ref 0.00–0.07)
Basophils Absolute: 0 10*3/uL (ref 0.0–0.1)
Basophils Relative: 1 %
Eosinophils Absolute: 0 10*3/uL (ref 0.0–0.5)
Eosinophils Relative: 1 %
HCT: 40.4 % (ref 39.0–52.0)
Hemoglobin: 13.5 g/dL (ref 13.0–17.0)
Immature Granulocytes: 0 %
Lymphocytes Relative: 24 %
Lymphs Abs: 1.6 10*3/uL (ref 0.7–4.0)
MCH: 27.8 pg (ref 26.0–34.0)
MCHC: 33.4 g/dL (ref 30.0–36.0)
MCV: 83.3 fL (ref 80.0–100.0)
Monocytes Absolute: 0.4 10*3/uL (ref 0.1–1.0)
Monocytes Relative: 5 %
Neutro Abs: 4.6 10*3/uL (ref 1.7–7.7)
Neutrophils Relative %: 69 %
Platelet Count: 212 10*3/uL (ref 150–400)
RBC: 4.85 MIL/uL (ref 4.22–5.81)
RDW: 12.4 % (ref 11.5–15.5)
WBC Count: 6.6 10*3/uL (ref 4.0–10.5)
nRBC: 0 % (ref 0.0–0.2)

## 2023-12-25 LAB — PSA: Prostatic Specific Antigen: 0.03 ng/mL (ref 0.00–4.00)

## 2023-12-25 MED ORDER — PREDNISONE 5 MG PO TABS
5.0000 mg | ORAL_TABLET | Freq: Every day | ORAL | 2 refills | Status: DC
  Filled 2023-12-25 – 2024-01-16 (×2): qty 30, 30d supply, fill #0
  Filled 2024-02-15: qty 30, 30d supply, fill #1
  Filled 2024-03-12: qty 30, 30d supply, fill #2

## 2023-12-25 MED ORDER — ABIRATERONE ACETATE 250 MG PO TABS
1000.0000 mg | ORAL_TABLET | Freq: Every day | ORAL | 2 refills | Status: DC
  Filled 2023-12-25 – 2024-01-01 (×2): qty 120, 30d supply, fill #0
  Filled 2024-02-15 – 2024-02-18 (×2): qty 120, 30d supply, fill #1
  Filled 2024-03-12: qty 120, 30d supply, fill #2

## 2023-12-25 NOTE — Assessment & Plan Note (Addendum)
 Stage IV metastatic prostate cancer.  NGS showed -no potentially actionable variants. He tolerates Abiraterone  1000mg  daily with prednisone  5mg  daily.  Continue current regimen.  PSA 0.08, today's level is pending Continue monitor PSA and plan repeat imaging if PSA rises.

## 2023-12-25 NOTE — Assessment & Plan Note (Signed)
 lower back pain has resolved.  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  Avoid heavy weight lifting.

## 2023-12-25 NOTE — Progress Notes (Signed)
 Hematology/Oncology Progress note [phone] [phone]           REFERRING PROVIDER: McDiarmid, Demetra Filter, MD   CHIEF COMPLAINTS/REASON FOR VISIT:  Metastatic prostate cancer   ASSESSMENT & PLAN:   Cancer Staging  Prostate cancer [facility]) Staging form: Prostate, AJCC 8th Edition - Clinical stage from 03/21/2023: Stage IVA (cT3b, cN1, cM0, PSA: 167) - Signed by Timmy Forbes, MD on 03/21/2023   Prostate cancer (HCC) Stage IV metastatic prostate cancer.  NGS showed -no potentially actionable variants. He tolerates Abiraterone  1000mg  daily with prednisone  5mg  daily.  Continue current regimen.  PSA 0.08, today's level is pending Continue monitor PSA and plan repeat imaging if PSA rises.   Metastasis to bone [facility]) lower back pain has resolved.  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  Avoid heavy weight lifting.     Orders Placed This Encounter  Procedures   CBC with Differential (Cancer Center Only)    Standing Status:   Future    Expected Date:   03/25/2024    Expiration Date:   12/24/2024   CMP (Cancer Center only)    Standing Status:   Future    Expected Date:   03/25/2024    Expiration Date:   12/24/2024   PSA    Standing Status:   Future    Expected Date:   03/25/2024    Expiration Date:   12/24/2024   Follow-up 3 months All questions were answered. The patient knows to call the clinic with any problems, questions or concerns.  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  Oncology History  Prostate cancer (HCC)  02/21/2023 Imaging   CT angiogram chest aorta with contrast showed 1. No acute intrathoracic abnormality. 2. 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 Continue  annual imaging followup by CTA or MRA. This recommendation follows 2010 ACCF/AHA/AATS/ACR/ASA/SCA/SCAI/SIR/STS/SVM Guidelines for the Diagnosis and Management of Patients with Thoracic Aortic Disease. Circulation. 2010; 121: X528-U132. Aortic aneurysm NOS (ICD10-I71.9) 3. Severe burden multivessel coronary atherosclerosis, greatest within the LAD. 4. Enlarged RIGHT hilar lymph nodes, suspicious. RIGHT posterior apical scarring without a discrete nodule. No suspicious mass within the RIGHT lung.   03/11/2023 Initial Diagnosis   Prostate cancer [facility]  April 2024, patient's PSA was elevated 167. Patient saw urologist.  Status post prostate biopsy.  Results were not available in epic.  Per patient he was diagnosed with prostate cancer.   03/14/2023 Imaging   PSMA PET scan showed 1. Intense radiotracer activity involving the near entirety of the RIGHT lobe of the prostate gland with extension to the LEFT lobe consistent with primary prostate adenocarcinoma. 2. Intense radiotracer activity extends into the RIGHT seminal vesicle consistent with nodal metastasis. 3. Intense radiotracer avid lymph nodes in the pelvis consistent with nodal metastasis. 4. Intense radiotracer avid metastatic adenopathy in the RIGHT lower paratracheal space consistent with mediastinal nodal metastasis. 5. Intense radiotracer avid metastatic disease to the skeleton with involvement of the thoracic spine, iliac bones, and RIGHT acromion process. 6. Nodular implant beneath the RIGHT diaphragm at the level of the upper pole of the RIGHT kidney consistent with metastatic implant.   03/21/2023 Cancer Staging   Staging form: Prostate, AJCC 8th Edition -  Clinical stage from 03/21/2023: Stage IVA (cT3b, cN1, cM0, PSA: 167) - Signed by Timmy Forbes, MD on 03/21/2023 Stage prefix: Initial diagnosis Prostate specific antigen (PSA) range: 20 or greater   03/21/2023 Tumor Marker   PSA 329   04/21/2023 Tumor Marker   PSA 7.84    04/25/2023 -  Chemotherapy   Patient started on Abiaterone 1000mg  daily and Prednisone  5mg  daily.      He has established care with Urology, and has started on Flomax . Urinary urgency has improved.  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  Today he reports feeling ok  Overall he tolerates Abiaterone and prednisone  regimen + Fatigue and hot flash    MEDICAL HISTORY:  Past Medical History:  Diagnosis Date   Abdominal wall hernia 03/20/2018   ATTENTION DEFICIT, W/O HYPERACTIVITY 10/30/2006   Qualifier: History of  By: McDiarmid MD, Todd     Cataract    bilateral cataracts with lens implant   Chest pain, atypical 12/05/2017   COLON POLYP 02/07/2014   Tubular Adenoma x 1 - Dr Howard Macho (GI)   Diverticulosis of colon 07/09/2018   Based on colonoscopy 06/2108   Encounter for neuropsychological testing 06/26/2015   [facility]: Only B/L avg in rote memory auditory info encoding. No mood dysfnc   Encounter for neuropsychological testing 2008   Michael Zelsen (Neuropsy): Mixed anxiety and depression o/w normal.    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  Repeat testing 06/2015 at [facility]: Only finding was below average in rote memory auditory info encoding. No mood dysfunction.    Obesity (BMI 30.0-34.9) 10/30/2006   Qualifier: History of   By: McDiarmid MD, Todd       Overweight (BMI 25.0-29.9) 10/30/2006   Qualifier:  History of  By: McDiarmid MD, Todd     Prediabetes 12/21/2013   Pulmonary nodule, left 06/09/2015   Pure hypercholesterolemia 10/30/2006   ACC calculation 10-yr risk of CV event of 9.5% with recommendation to consider Statin therapy of moderate-to-high potency (12/21/13)     Rotator cuff tear 12/08/2020   Shoulder injury, right, initial encounter 11/02/2020    SURGICAL HISTORY: Past Surgical History:  Procedure Laterality Date   CATARACT EXTRACTION W/ INTRAOCULAR LENS  IMPLANT, BILATERAL     COCCYGECTOMY  1985   VASECTOMY      SOCIAL HISTORY: Social History   Socioeconomic History   Marital status: Married    Spouse name: Amalia Badder   Number of children: 4   Years of education: >20   Highest education level: Not on file  Occupational History   Occupation: Education officer, community   Occupation: Pharmacist  Tobacco Use   Smoking status: Never   Smokeless tobacco: Never  Vaping Use   Vaping status: Never Used  Substance and Sexual Activity   Alcohol use: Yes    Alcohol/week: 3.0 standard drinks of alcohol    Types: 3 drink(s) per week   Drug use: No   Sexual activity: Yes  Comment: monogamous  Other Topics Concern   Not on file  Social History Narrative   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   4 adult sons ([place], [place] , [place] ): 3 biological and 1 step-child   Patient is third of six children   No formal HCPOA executed (12/21/13)   No Advanced Directive executed (12/21/13)   He and wife are raising a granddaughter (2016)   Hobbies: Motorcycling and Horse riding.         Social Drivers of Corporate investment banker Strain: Not on file  Food Insecurity: No Food Insecurity (03/21/2023)   Hunger Vital Sign    Worried About Running Out of Food in the Last Year: Never true    Ran Out of Food in the Last Year: Never true  Transportation Needs: No Transportation Needs (03/21/2023)   PRAPARE - Administrator, Civil Service (Medical): No    Lack of Transportation  (Non-Medical): No  Physical Activity: Not on file  Stress: Not on file  Social Connections: Not on file  Intimate Partner Violence: Not At Risk (03/21/2023)   Humiliation, Afraid, Rape, and Kick questionnaire    Fear of Current or Ex-Partner: No    Emotionally Abused: No    Physically Abused: No    Sexually Abused: No    FAMILY HISTORY: Family History  Problem Relation Age of Onset   Hypertension Mother    Stroke Mother 65   Dementia Mother 53   Diabetes Father    Diabetes Sister    Diabetes Brother    Esophageal cancer Brother    Lymphoma Brother    Esophageal cancer Brother    Lymphoma Brother    Diabetes Brother    Colon cancer Neg Hx     ALLERGIES:  is allergic to cephalosporins.  MEDICATIONS:  Current Outpatient Medications  Medication Sig Dispense Refill   abiraterone  acetate (ZYTIGA ) 250 MG tablet Take 4 tablets (1,000 mg total) by mouth daily. Take on an empty stomach 1 hour before or 2 hours after a meal 120 tablet 2   aspirin  EC 81 MG tablet Take 1 tablet (81 mg total) by mouth daily. Swallow whole. 90 tablet 3   atorvastatin  (LIPITOR) 40 MG tablet Take 1 tablet (40 mg total) by mouth daily. 90 tablet 3   lisinopril -hydrochlorothiazide  (ZESTORETIC ) 20-25 MG tablet Take 1 tablet by mouth daily. 90 tablet 3   metoprolol  tartrate (LOPRESSOR ) 100 MG tablet Take 1 tablet (100 mg total) by mouth once for 1 dose. TWO HOURS PRIOR TO CARDIAC CT 1 tablet 0   predniSONE  (DELTASONE ) 5 MG tablet Take 1 tablet (5 mg total) by mouth daily with breakfast. 30 tablet 1   Vibegron  (GEMTESA ) 75 MG TABS Take 1 tablet (75 mg total) by mouth daily. 30 tablet 11   No current facility-administered medications for this visit.    Review of Systems  Constitutional:  Negative for appetite change, chills, fatigue, fever and unexpected weight change.  HENT:   Negative for hearing loss and voice change.   Eyes:  Negative for eye problems and icterus.  Respiratory:  Negative for chest  tightness, cough and shortness of breath.   Cardiovascular:  Negative for chest pain and leg swelling.  Gastrointestinal:  Negative for abdominal distention and abdominal pain.  Endocrine: Negative for hot flashes.  Genitourinary:  Negative for difficulty urinating, dysuria and frequency.   Musculoskeletal:  Positive for arthralgias. Negative for back pain.  Skin:  Negative for  itching and rash.  Neurological:  Negative for light-headedness and numbness.       Memory is not good  Hematological:  Negative for adenopathy. Does not bruise/bleed easily.  Psychiatric/Behavioral:  Negative for confusion.    PHYSICAL EXAMINATION: ECOG PERFORMANCE STATUS: 0 - Asymptomatic Vitals:   12/25/23 1041  BP: 138/74  Pulse: 68  Resp: 18  Temp: (!) 97.3 F (36.3 C)  SpO2: 98%   Filed Weights   12/25/23 1041  Weight: 186 lb 3.2 oz (84.5 kg)    Physical Exam Constitutional:      General: He is not in acute distress. HENT:     Head: Normocephalic and atraumatic.  Eyes:     General: No scleral icterus. Cardiovascular:     Rate and Rhythm: Normal rate and regular rhythm.  Pulmonary:     Effort: Pulmonary effort is normal. No respiratory distress.  Abdominal:     General: There is no distension.     Palpations: Abdomen is soft.  Musculoskeletal:        General: No deformity. Normal range of motion.     Cervical back: Normal range of motion and neck supple.  Skin:    Findings: No erythema or rash.  Neurological:     Mental Status: He is alert and oriented to person, place, and time. Mental status is at baseline.  Psychiatric:        Mood and Affect: Mood normal.     LABORATORY DATA:  I have reviewed the data as listed    Latest Ref Rng & Units 12/25/2023   10:21 AM 10/13/2023    1:46 PM 08/11/2023   10:57 AM  CBC  WBC 4.0 - 10.5 K/uL 6.6  8.0  7.5   Hemoglobin 13.0 - 17.0 g/dL 47.8  29.5  62.1   Hematocrit 39.0 - 52.0 % 40.4  41.5  43.2   Platelets 150 - 400 K/uL 212  212  225        Latest Ref Rng & Units 12/25/2023   10:20 AM 10/27/2023    9:18 AM 10/13/2023    1:46 PM  CMP  Glucose 70 - 99 mg/dL 308  657  92   BUN 8 - 23 mg/dL 22  26  22    Creatinine 0.61 - 1.24 mg/dL 8.46  9.62  9.52   Sodium 135 - 145 mmol/L 139  142  141   Potassium 3.5 - 5.1 mmol/L 3.6  4.8  4.3   Chloride 98 - 111 mmol/L 102  101  102   CO2 22 - 32 mmol/L 28  25  28    Calcium  8.9 - 10.3 mg/dL 9.5  9.7  9.4   Total Protein 6.5 - 8.1 g/dL 7.2   7.2   Total Bilirubin 0.0 - 1.2 mg/dL 1.1   0.9   Alkaline Phos 38 - 126 U/L 37   42   AST 15 - 41 U/L 24   23   ALT 0 - 44 U/L 19   20       RADIOGRAPHIC STUDIES: I have personally reviewed the radiological images as listed and agreed with the findings in the report. CT CORONARY MORPH W/CTA COR W/SCORE W/CA W/CM &/OR WO/CM Addendum Date: 12/02/2023 ADDENDUM REPORT: 12/02/2023 13:41 ADDENDUM: OVER-READ INTERPRETATION  CT CHEST The following report is an over-read performed by radiologist Dr. Avanell Bob [facility] on 11/21/2023. This over-read does not include interpretation of cardiac or coronary anatomy or pathology. The interpretation by the  cardiologist is attached. COMPARISON:  CT chest February 21, 2023 FINDINGS: Within the visualized portions of the lungs demonstrates no acute infiltrates consolidations. No pulmonary nodules or masses. No pleural effusions. No mediastinal masses or adenopathy. No pericardial effusions. Aorta appears normal caliber. No acute findings in the upper abdomen. Chest wall and soft tissues are unremarkable without evidence of acute bony abnormalities. Significant coronary artery calcifications. The ascending aorta measures 3.8 x 3.4 cm proximally and 3.7 x 3.8 cm distally without evidence of aneurysms. IMPRESSION: Unremarkable CT without evidence of pulmonary nodules or masses. Electronically Signed   By: Fredrich Jefferson M.D.   On: 12/02/2023 13:41   Result Date: 12/02/2023 CLINICAL DATA:  Chest pain EXAM:  Cardiac/Coronary  CTA TECHNIQUE: The patient was scanned on a Siemens Somatom scanner. : A retrospective scan was triggered in the ascending thoracic aorta. Axial non-contrast 3 mm slices were carried out through the heart. The data set was analyzed on a dedicated work station and scored using the Agatson method. Gantry rotation speed was 66 msecs and collimation was .6 mm. 100mg  of metoprolol  and 0.8 mg of sl NTG was given. The 3D data set was reconstructed in 5% intervals of the 60-95 % of the R-R cycle. Diastolic phases were analyzed on a dedicated work station using MPR, MIP and VRT modes. The patient received 75 cc of contrast. FINDINGS: Aorta:  Normal size.  No calcifications.  No dissection. Aortic Valve:  Trileaflet. Mild calcifications. Coronary Arteries:  Normal coronary origin.  Right dominance. RCA is a dominant artery. There is calcified plaque proximally causing moderate stenosis (50-69%). Left main gives rise to LAD and LCX arteries. LM has no disease. LAD has calcified plaque proximally causing severe stenosis (>70%). LCX is a non-dominant artery. There is minimal stenosis proximally (<25%). Other findings: Normal pulmonary vein drainage into the left atrium. Normal left atrial appendage without a thrombus. Normal size of the pulmonary artery. IMPRESSION: 1. Coronary calcium  score of 3577. This was 94th percentile for age and sex matched control. 2. Normal coronary origin with right dominance. 3. Severe proximal LAD stenosis (>70%). 4. Moderate proximal RCA stenosis (50-69%). 5. CAD-RADS 4 Severe stenosis. (70-99% or > 50% left main). Cardiac catheterization is recommended. Consider symptom-guided anti-ischemic pharmacotherapy as well as risk factor modification per guideline directed care. 6. Additional analysis with CT FFR will be submitted and reported separately. Electronically Signed: By: Constancia Delton M.D. On: 12/01/2023 13:17   CT CORONARY FFR DATA PREP & FLUID ANALYSIS Result Date:  12/01/2023 EXAM: CT FFR ANALYSIS CLINICAL DATA:  Abnormal CCTA FINDINGS: FFRct analysis was performed on the original cardiac CT angiogram dataset. Diagrammatic representation of the FFRct analysis is provided in a separate PDF document in PACS. This dictation was created using the PDF document and an interactive 3D model of the results. 3D model is not available in the EMR/PACS. Normal FFR range is >0.80. 1. Left Main:  No significant stenosis. 2. LAD: significant stenosis in mid LAD.  FFRct 0.72 3. LCX: significant stenosis in OM1.  FFRct 0.70 4. RCA: No significant stenosis.  FFRct 0.92 IMPRESSION: 1. CT FFR analysis showed significant stenosis in the mid LAD and proximal OM1. 2.  Cardiac catheterization recommended. Electronically Signed   By: Constancia Delton M.D.   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 Exam Location:  [facility] Procedure: 2D Echo, Cardiac Doppler, Color Doppler and Strain Analysis (Both            Spectral and Color Flow Doppler were utilized during procedure). Indications:    R07.9* Chest pain, unspecified  History:        Patient has no prior history of Echocardiogram examinations.                 Signs/Symptoms:Chest Pain; Risk Factors:Hypertension,                 Dyslipidemia and Non-Smoker.  Sonographer:    Malena Scull RDMS, RVT, RDCS Referring Phys: [id number] BRIAN AGBOR-ETANG IMPRESSIONS  1. Left ventricular ejection fraction, by estimation, is 60 to 65%. The left ventricle has normal function. The left ventricle has no regional wall motion abnormalities. Left ventricular  diastolic parameters are consistent with Grade I diastolic dysfunction (impaired relaxation).  2. Right ventricular systolic function is normal. The right ventricular size is normal.  3. The mitral valve is degenerative. No evidence of mitral valve regurgitation.  4. The aortic valve is tricuspid. Aortic valve regurgitation is mild. Aortic valve sclerosis/calcification is present, without any evidence of aortic stenosis. Aortic valve mean gradient measures 6.0 mmHg.  5. Aortic dilatation noted. There is mild dilatation of the ascending aorta, measuring 40 mm.  6. The inferior vena cava is normal in size with greater than 50% respiratory variability, suggesting right atrial pressure of 3 mmHg. FINDINGS  Left Ventricle: Left ventricular ejection fraction, by estimation, is 60 to 65%. The left ventricle has normal function. The left ventricle has no regional wall motion abnormalities. Global longitudinal strain performed but not reported based on interpreter judgement due to suboptimal tracking. The left ventricular internal cavity size was normal in size. There is no left ventricular hypertrophy. Left ventricular diastolic parameters are consistent with Grade I diastolic dysfunction (impaired relaxation). Right Ventricle: The right ventricular size is normal. No increase in right ventricular wall thickness. Right ventricular systolic function is normal. Left Atrium: Left atrial size was normal in size. Right Atrium: Right atrial size was normal in size. Pericardium: There is no evidence of pericardial effusion. Mitral Valve: The mitral valve is degenerative in appearance. There is mild calcification of the mitral valve leaflet(s). No evidence of mitral valve regurgitation. Tricuspid Valve: The tricuspid valve is normal in structure. Tricuspid valve regurgitation is not demonstrated. Aortic Valve: The aortic valve is tricuspid. Aortic valve regurgitation is mild. Aortic regurgitation PHT measures 427 msec. Aortic valve  sclerosis/calcification is present, without any evidence of aortic stenosis. Aortic valve mean gradient measures 6.0  mmHg. Aortic valve peak gradient measures 11.7 mmHg. Aortic valve area, by VTI measures 2.73 cm. Pulmonic Valve: The pulmonic valve was normal in structure. Pulmonic valve  regurgitation is not visualized. Aorta: Aortic dilatation noted. There is mild dilatation of the ascending aorta, measuring 40 mm. Venous: The inferior vena cava is normal in size with greater than 50% respiratory variability, suggesting right atrial pressure of 3 mmHg. IAS/Shunts: No atrial level shunt detected by color flow Doppler. Additional Comments: 3D was performed not requiring image post processing on an independent workstation and was indeterminate.  LEFT VENTRICLE PLAX 2D LVIDd:         4.40 cm      Diastology LVIDs:         3.10 cm      LV e' medial:    7.07 cm/s LV PW:         0.80 cm      LV E/e' medial:  13.5 LV IVS:        0.80 cm      LV e' lateral:   8.81 cm/s LVOT diam:     2.20 cm      LV E/e' lateral: 10.8 LV SV:         98 LV SV Index:   49 LVOT Area:     3.80 cm  LV Volumes (MOD) LV vol d, MOD A2C: 92.1 ml LV vol d, MOD A4C: 100.0 ml LV vol s, MOD A2C: 37.0 ml LV vol s, MOD A4C: 45.8 ml LV SV MOD A2C:     55.1 ml LV SV MOD A4C:     100.0 ml LV SV MOD BP:      54.4 ml RIGHT VENTRICLE             IVC RV Basal diam:  3.80 cm     IVC diam: 1.30 cm RV S prime:     16.60 cm/s TAPSE (M-mode): 2.6 cm LEFT ATRIUM             Index LA diam:        3.40 cm 1.71 cm/m LA Vol (A2C):   55.4 ml 27.84 ml/m LA Vol (A4C):   44.8 ml 22.51 ml/m LA Biplane Vol: 51.0 ml 25.63 ml/m  AORTIC VALVE                     PULMONIC VALVE AV Area (Vmax):    2.80 cm      PV Vmax:       0.93 m/s AV Area (Vmean):   2.57 cm      PV Peak grad:  3.4 mmHg AV Area (VTI):     2.73 cm AV Vmax:           171.00 cm/s AV Vmean:          113.000 cm/s AV VTI:            0.359 m AV Peak Grad:      11.7 mmHg AV Mean Grad:      6.0 mmHg LVOT Vmax:          126.00 cm/s LVOT Vmean:        76.500 cm/s LVOT VTI:          0.258 m LVOT/AV VTI ratio: 0.72 AI PHT:            427 msec AR Vena Contracta: 0.40 cm  AORTA Ao Root diam: 3.20 cm Ao Asc diam:  4.00 cm Ao Arch diam: 2.7 cm MITRAL VALVE MV Area (PHT): 2.91 cm     SHUNTS MV Decel Time: 261 msec     Systemic VTI:  0.26  m MV E velocity: 95.40 cm/s   Systemic Diam: 2.20 cm MV A velocity: 115.00 cm/s MV E/A ratio:  0.83 Constancia Delton MD Electronically signed by Constancia Delton MD Signature Date/Time: 11/24/2023/2:00:08 PM    Final

## 2023-12-31 ENCOUNTER — Encounter (HOSPITAL_COMMUNITY): Payer: Self-pay

## 2024-01-01 ENCOUNTER — Other Ambulatory Visit (HOSPITAL_COMMUNITY): Payer: Self-pay

## 2024-01-01 ENCOUNTER — Other Ambulatory Visit: Payer: Self-pay

## 2024-01-01 NOTE — Progress Notes (Signed)
 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

## 2024-01-03 ENCOUNTER — Other Ambulatory Visit (HOSPITAL_COMMUNITY): Payer: Self-pay

## 2024-01-07 ENCOUNTER — Encounter: Payer: Self-pay | Admitting: Oncology

## 2024-01-15 ENCOUNTER — Other Ambulatory Visit: Payer: Self-pay

## 2024-01-15 ENCOUNTER — Telehealth: Payer: Self-pay | Admitting: Pharmacy Technician

## 2024-01-15 ENCOUNTER — Other Ambulatory Visit (HOSPITAL_COMMUNITY): Payer: Self-pay

## 2024-01-15 NOTE — Telephone Encounter (Signed)
 Oral Oncology Patient Advocate Encounter  Prior Authorization for Zytiga  has been approved.    PA# [id number] Effective dates: 09/03/2023 through 09/01/2024  Patient can continue to fill at [facility].   Patty Benjaman Branch, CPhT Oncology Pharmacy Patient Advocate [facility] Direct Number: [phone] Fax: [phone]

## 2024-01-15 NOTE — Telephone Encounter (Signed)
 Oral Oncology Patient Advocate Encounter   Received notification that prior authorization for Zytiga  is due for renewal.   PA submitted on 01/15/2024 Key [id number] Status is pending     Patty Benjaman Branch, CPhT Oncology Pharmacy Patient Advocate [facility] Direct Number: [phone] Fax: [phone]

## 2024-01-16 ENCOUNTER — Other Ambulatory Visit (HOSPITAL_COMMUNITY): Payer: Self-pay

## 2024-01-16 ENCOUNTER — Other Ambulatory Visit: Payer: Self-pay

## 2024-02-02 ENCOUNTER — Other Ambulatory Visit (HOSPITAL_BASED_OUTPATIENT_CLINIC_OR_DEPARTMENT_OTHER): Payer: Self-pay

## 2024-02-09 ENCOUNTER — Ambulatory Visit: Admitting: Cardiology

## 2024-02-16 ENCOUNTER — Other Ambulatory Visit (HOSPITAL_COMMUNITY): Payer: Self-pay

## 2024-02-16 ENCOUNTER — Other Ambulatory Visit: Payer: Self-pay

## 2024-02-18 ENCOUNTER — Other Ambulatory Visit (HOSPITAL_COMMUNITY): Payer: Self-pay

## 2024-02-18 ENCOUNTER — Other Ambulatory Visit: Payer: Self-pay

## 2024-02-18 ENCOUNTER — Other Ambulatory Visit: Payer: Self-pay | Admitting: Pharmacy Technician

## 2024-02-18 NOTE — Progress Notes (Signed)
 Clinical Intervention Note  Clinical Intervention Notes: Patient started taking atorvastatin . No DDIs identified with abiraterone .   Clinical Intervention Outcomes: Prevention of an adverse drug event   Advertising account planner

## 2024-02-18 NOTE — Progress Notes (Signed)
 Specialty Pharmacy Refill Coordination Note  Christopher Wall, Christopher Wall is a 78 y.o. male contacted today regarding refills of specialty medication(s) Abiraterone  Acetate (ZYTIGA )   Patient requested [name] at [facility] [place] date: 02/18/24   Medication will be filled on 02/18/24.  Pt confirmed & aware of co-pay is $140.

## 2024-02-19 ENCOUNTER — Other Ambulatory Visit (HOSPITAL_COMMUNITY): Payer: Self-pay

## 2024-02-23 ENCOUNTER — Other Ambulatory Visit: Payer: Self-pay

## 2024-02-23 NOTE — Progress Notes (Signed)
 Specialty Pharmacy Ongoing Clinical Assessment Note  Christopher Wall, Christopher Wall is a 78 y.o. male who is being followed by the specialty pharmacy service for RxSp Oncology   Patient's specialty medication(s) reviewed today: Abiraterone  Acetate (ZYTIGA )   Missed doses in the last 4 weeks: 0   Patient/Caregiver did not have any additional questions or concerns.   Therapeutic benefit summary: Patient is achieving benefit   Adverse events/side effects summary: Experienced adverse events/side effects (hot flashes and fatigue, tolerable)   Patient's therapy is appropriate to: Continue    Goals Addressed             This Visit's Progress    Slow Disease Progression   On track    Patient is on track. Patient will maintain adherence. PSA has decreased to 0.03 as of 12/25/23.          Follow up: 3 months  [facility]

## 2024-03-03 ENCOUNTER — Other Ambulatory Visit: Payer: Self-pay

## 2024-03-06 ENCOUNTER — Other Ambulatory Visit (HOSPITAL_COMMUNITY): Payer: Self-pay

## 2024-03-12 ENCOUNTER — Other Ambulatory Visit: Payer: Self-pay

## 2024-03-12 ENCOUNTER — Encounter: Payer: Self-pay | Admitting: Oncology

## 2024-03-12 NOTE — Progress Notes (Signed)
 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 male contacted today regarding refills of specialty medication(s) Abiraterone  Acetate (ZYTIGA )   Patient requested [facility] at [facility] [place] date: 03/17/24   Medication will be filled on 03/16/24, with prednisone .

## 2024-03-15 ENCOUNTER — Encounter: Payer: Self-pay | Admitting: Cardiology

## 2024-03-15 ENCOUNTER — Ambulatory Visit: Attending: Cardiology | Admitting: Cardiology

## 2024-03-15 VITALS — BP 126/74 | HR 77 | Ht 68.0 in | Wt 187.1 lb

## 2024-03-15 DIAGNOSIS — E782 Mixed hyperlipidemia: Secondary | ICD-10-CM

## 2024-03-15 DIAGNOSIS — I2510 Atherosclerotic heart disease of native coronary artery without angina pectoris: Secondary | ICD-10-CM

## 2024-03-15 DIAGNOSIS — I10 Essential (primary) hypertension: Secondary | ICD-10-CM | POA: Diagnosis not present

## 2024-03-15 NOTE — Patient Instructions (Signed)
 Medication Instructions:  Your physician recommends that you continue on your current medications as directed. Please refer to the Current Medication list given to you today.   *If you need a refill on your cardiac medications before your next appointment, please call your pharmacy*  Lab Work: No labs ordered today  If you have labs (blood work) drawn today and your tests are completely normal, you will receive your results only by: MyChart Message (if you have MyChart) OR A paper copy in the mail If you have any lab test that is abnormal or we need to change your treatment, we will call you to review the results.  Testing/Procedures: No test ordered today   Follow-Up: At [facility], you and your health needs are our priority.  As part of our continuing mission to provide you with exceptional heart care, our providers are all part of one team.  This team includes your primary Cardiologist (physician) and Advanced Practice Providers or APPs (Physician Assistants and Nurse Practitioners) who all work together to provide you with the care you need, when you need it.  Your next appointment:   6 month(s)  Provider:   You may see Constancia Delton, MD or one of the following Advanced Practice Providers on your designated Care Team:   Laneta Pintos, NP Gildardo Labrador, PA-C Varney Gentleman, PA-C Cadence Kettering, PA-C Ronald Cockayne, NP Morey Ar, NP    We recommend signing up for the patient portal called "MyChart".  Sign up information is provided on this After Visit Summary.  MyChart is used to connect with patients for Virtual Visits (Telemedicine).  Patients are able to view lab/test results, encounter notes, upcoming appointments, etc.  Non-urgent messages can be sent to your provider as well.   To learn more about what you can do with MyChart, go to [email or web address].

## 2024-03-15 NOTE — Progress Notes (Signed)
 Cardiology Office Note:    Date:  03/15/2024   ID:  Aleene FORBES [place], Christopher Wall, DOB 01-25-1946, MRN [id number]  PCP:  McDiarmid, Krystal BIRCH, MD   [facility] Providers Cardiologist:  Redell Cave, MD     Referring MD: McDiarmid, Krystal BIRCH, MD   Chief Complaint  Patient presents with   Follow-up    3 month f/u no complaints today. Meds reviewed verbally with pt.    History of Present Illness:    Christopher Wall, Christopher Wall is a 78 y.o. male with a hx of CAD ( CCTA 11/2023 calcium  score 3577, severe proximal LAD stenosis, moderate RCA stenosis.  FFR CT showing significant LAD stenosis, OM1 stenosis),  hypertension, hyperlipidemia, stage IV prostate cancer presenting for follow-up.  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  Still with occasional pain, declined addition of beta-blocker at this time.  Had not started taking aspirin  yet.   Prior notes/testing Echo 11/2023 EF 60 to 65%   Past Medical History:  Diagnosis Date   Abdominal wall hernia 03/20/2018   ATTENTION DEFICIT, W/O HYPERACTIVITY 10/30/2006   Qualifier: History of  By: McDiarmid MD, Krystal     Cataract    bilateral cataracts with lens implant   Chest pain, atypical 12/05/2017   COLON POLYP 02/07/2014   Tubular Adenoma x 1 - Dr Teressa (GI)   Diverticulosis of colon 07/09/2018   Based on colonoscopy 06/2108   Encounter for neuropsychological testing 06/26/2015   [facility]: Only B/L avg in rote memory auditory info encoding. No mood dysfnc   Encounter for neuropsychological testing 2008   [facility] (Neuropsy): Mixed anxiety and depression o/w normal.    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  Repeat testing 06/2015 at [facility]: Only finding was below average in rote memory auditory info encoding. No mood dysfunction.    Obesity (BMI 30.0-34.9) 10/30/2006   Qualifier: History of   By: McDiarmid MD, Todd       Overweight (BMI 25.0-29.9) 10/30/2006   Qualifier: History of  By: McDiarmid MD, Todd     Prediabetes 12/21/2013   Pulmonary nodule, left 06/09/2015   Pure hypercholesterolemia 10/30/2006   ACC calculation 10-yr risk of CV event of 9.5% with recommendation to consider Statin therapy of moderate-to-high potency (12/21/13)     Rotator cuff tear 12/08/2020   Shoulder injury, right, initial encounter 11/02/2020    Past Surgical History:  Procedure Laterality Date   CATARACT EXTRACTION W/ INTRAOCULAR LENS  IMPLANT, BILATERAL     COCCYGECTOMY  1985   VASECTOMY      Current Medications: Current Meds  Medication Sig   abiraterone  acetate (ZYTIGA ) 250 MG tablet Take 4 tablets (1,000 mg total) by mouth daily. Take on an empty stomach 1 hour before or 2 hours after a meal  atorvastatin  (LIPITOR) 40 MG tablet Take 1 tablet (40 mg total) by mouth daily.   ibuprofen (ADVIL) 600 MG tablet Take 600 mg by mouth at bedtime.   lisinopril -hydrochlorothiazide  (ZESTORETIC ) 20-25 MG tablet Take 1 tablet by mouth daily.   predniSONE  (DELTASONE ) 5 MG tablet Take 1 tablet (5 mg total) by mouth daily with breakfast.   Vibegron  (GEMTESA ) 75 MG TABS Take 1 tablet (75 mg total) by mouth daily.     Allergies:   Cephalosporins   Social History   Socioeconomic History   Marital status: Married     Spouse name: Devere   Number of children: 4   Years of education: >20   Highest education level: Not on file  Occupational History   Occupation: Education officer, community   Occupation: Pharmacist  Tobacco Use   Smoking status: Never   Smokeless tobacco: Never  Vaping Use   Vaping status: Never Used  Substance and Sexual Activity   Alcohol use: Yes    Alcohol/week: 3.0 standard drinks of alcohol    Types: 3 drink(s) per week   Drug use: No   Sexual activity: Yes    Comment: monogamous  Other Topics Concern   Not on file  Social History Narrative   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   4 adult sons ([place], [place] , [place] ): 3 biological and 1 step-child   Patient is third of six children   No formal HCPOA executed (12/21/13)   No Advanced Directive executed (12/21/13)   He and wife are raising a granddaughter (2016)   Hobbies: Motorcycling and Horse riding.         Social Drivers of Corporate investment banker Strain: Not on file  Food Insecurity: No Food Insecurity (03/21/2023)   Hunger Vital Sign    Worried About Running Out of Food in the Last Year: Never true    Ran Out of Food in the Last Year: Never true  Transportation Needs: No Transportation Needs (03/21/2023)   PRAPARE - Administrator, Civil Service (Medical): No    Lack of Transportation (Non-Medical): No  Physical Activity: Not on file  Stress: Not on file  Social Connections: Not on file     Family History: The patient's family history includes Dementia (age of onset: 11) in his mother; Diabetes in his brother, brother, father, and sister; Esophageal cancer in his brother and brother; Hypertension in his mother; Lymphoma in his brother and brother; Stroke (age of onset: 34) in his mother. There is no history of Colon cancer.  ROS:   Please see the history of present illness.     All other systems reviewed and are negative.  EKGs/Labs/Other Studies Reviewed:    The following studies were  reviewed today:       Recent Labs: 12/25/2023: ALT 19; BUN 22; Creatinine 0.96; Hemoglobin 13.5; Platelet Count 212; Potassium 3.6; Sodium 139  Recent Lipid Panel    Component Value Date/Time   CHOL 208 (H) 12/26/2022 1740   TRIG 172 (H) 12/26/2022 1740   HDL 67 12/26/2022 1740   CHOLHDL 3.1 12/26/2022 1740   CHOLHDL 4.8 06/08/2015 1229   VLDL 38 (H) 06/08/2015 1229   LDLCALC 111 (H) 12/26/2022 1740   LDLDIRECT 146 (H) 11/02/2020 1207     Risk Assessment/Calculations:             Physical Exam:    VS:  BP 126/74 (BP Location: Left Arm, Patient Position: Sitting, Cuff Size: Normal)  Pulse 77   Ht 5' 8 (1.727 m)   Wt 187 lb 2 oz (84.9 kg)   SpO2 97%   BMI 28.45 kg/m     Wt Readings from Last 3 Encounters:  03/15/24 187 lb 2 oz (84.9 kg)  12/25/23 186 lb 3.2 oz (84.5 kg)  12/08/23 187 lb 9.6 oz (85.1 kg)     GEN:  Well nourished, well developed in no acute distress HEENT: Normal NECK: No JVD; No carotid bruits CARDIAC: RRR, no murmurs, rubs, gallops RESPIRATORY:  Clear to auscultation without rales, wheezing or rhonchi  ABDOMEN: Soft, non-tender, non-distended MUSCULOSKELETAL:  No edema; No deformity  SKIN: Warm and dry NEUROLOGIC:  Alert and oriented x 3 PSYCHIATRIC:  Normal affect   ASSESSMENT:    1. Coronary artery disease involving native coronary artery of native heart, unspecified whether angina present   2. Primary hypertension   3. Mixed hyperlipidemia    PLAN:    In order of problems listed above:  Chest pain, CCTA 11/2023 calcium  score 3577, severe proximal LAD stenosis, moderate RCA stenosis.  FFR CT showing significant LAD stenosis, OM1 stenosis.  EF 60 to 65%.  Continue aspirin  81 mg daily, Lipitor 40 mg daily.  He still has occasional chest pain. Will schedule left heart cath when patient decides to proceed.  Declined antianginal such as beta-blocker. Hypertension, BP controlled.  Continue lisinopril -HCTZ 20-25 mg daily. Hyperlipidemia,  continue Lipitor 40 mg daily.  Follow-up 6 months, or earlier if he decides on proceeding with left heart cath.     Medication Adjustments/Labs and Tests Ordered: Current medicines are reviewed at length with the patient today.  Concerns regarding medicines are outlined above.  No orders of the defined types were placed in this encounter.  No orders of the defined types were placed in this encounter.   Patient Instructions  Medication Instructions:  Your physician recommends that you continue on your current medications as directed. Please refer to the Current Medication list given to you today.   *If you need a refill on your cardiac medications before your next appointment, please call your pharmacy*  Lab Work: No labs ordered today  If you have labs (blood work) drawn today and your tests are completely normal, you will receive your results only by: MyChart Message (if you have MyChart) OR A paper copy in the mail If you have any lab test that is abnormal or we need to change your treatment, we will call you to review the results.  Testing/Procedures: No test ordered today   Follow-Up: At [facility], you and your health needs are our priority.  As part of our continuing mission to provide you with exceptional heart care, our providers are all part of one team.  This team includes your primary Cardiologist (physician) and Advanced Practice Providers or APPs (Physician Assistants and Nurse Practitioners) who all work together to provide you with the care you need, when you need it.  Your next appointment:   6 month(s)  Provider:   You may see Redell Cave, MD or one of the following Advanced Practice Providers on your designated Care Team:   Lonni Meager, NP Lesley Maffucci, PA-C Bernardino Bring, PA-C Cadence [place], PA-C Tylene Lunch, NP Barnie Hila, NP    We recommend signing up for the patient portal called MyChart.  Sign up information is provided  on this After Visit Summary.  MyChart is used to connect with patients for Virtual Visits (Telemedicine).  Patients are able to  view lab/test results, encounter notes, upcoming appointments, etc.  Non-urgent messages can be sent to your provider as well.   To learn more about what you can do with MyChart, go to [email or web address].         Signed, Redell Cave, MD  03/15/2024 12:38 PM    [facility]

## 2024-03-16 ENCOUNTER — Other Ambulatory Visit: Payer: Self-pay

## 2024-03-16 ENCOUNTER — Encounter: Payer: Self-pay | Admitting: Oncology

## 2024-03-29 ENCOUNTER — Inpatient Hospital Stay: Admitting: Oncology

## 2024-03-29 ENCOUNTER — Inpatient Hospital Stay: Attending: Oncology

## 2024-03-29 ENCOUNTER — Encounter: Payer: Self-pay | Admitting: Oncology

## 2024-03-29 VITALS — BP 120/71 | HR 75 | Temp 97.4°F | Resp 18 | Wt 186.9 lb

## 2024-03-29 DIAGNOSIS — C7951 Secondary malignant neoplasm of bone: Secondary | ICD-10-CM | POA: Insufficient documentation

## 2024-03-29 DIAGNOSIS — C61 Malignant neoplasm of prostate: Secondary | ICD-10-CM

## 2024-03-29 DIAGNOSIS — Z79899 Other long term (current) drug therapy: Secondary | ICD-10-CM | POA: Insufficient documentation

## 2024-03-29 DIAGNOSIS — Z7952 Long term (current) use of systemic steroids: Secondary | ICD-10-CM | POA: Diagnosis not present

## 2024-03-29 DIAGNOSIS — Z7982 Long term (current) use of aspirin: Secondary | ICD-10-CM | POA: Insufficient documentation

## 2024-03-29 LAB — CBC WITH DIFFERENTIAL (CANCER CENTER ONLY)
Abs Immature Granulocytes: 0.03 K/uL (ref 0.00–0.07)
Basophils Absolute: 0 K/uL (ref 0.0–0.1)
Basophils Relative: 0 %
Eosinophils Absolute: 0.1 K/uL (ref 0.0–0.5)
Eosinophils Relative: 1 %
HCT: 41.9 % (ref 39.0–52.0)
Hemoglobin: 14 g/dL (ref 13.0–17.0)
Immature Granulocytes: 0 %
Lymphocytes Relative: 25 %
Lymphs Abs: 2.3 K/uL (ref 0.7–4.0)
MCH: 28.2 pg (ref 26.0–34.0)
MCHC: 33.4 g/dL (ref 30.0–36.0)
MCV: 84.3 fL (ref 80.0–100.0)
Monocytes Absolute: 0.6 K/uL (ref 0.1–1.0)
Monocytes Relative: 7 %
Neutro Abs: 6.2 K/uL (ref 1.7–7.7)
Neutrophils Relative %: 67 %
Platelet Count: 205 K/uL (ref 150–400)
RBC: 4.97 MIL/uL (ref 4.22–5.81)
RDW: 13.4 % (ref 11.5–15.5)
WBC Count: 9.2 K/uL (ref 4.0–10.5)
nRBC: 0 % (ref 0.0–0.2)

## 2024-03-29 LAB — CMP (CANCER CENTER ONLY)
ALT: 30 U/L (ref 0–44)
AST: 31 U/L (ref 15–41)
Albumin: 4.6 g/dL (ref 3.5–5.0)
Alkaline Phosphatase: 46 U/L (ref 38–126)
Anion gap: 10 (ref 5–15)
BUN: 25 mg/dL — ABNORMAL HIGH (ref 8–23)
CO2: 26 mmol/L (ref 22–32)
Calcium: 9.8 mg/dL (ref 8.9–10.3)
Chloride: 101 mmol/L (ref 98–111)
Creatinine: 1.15 mg/dL (ref 0.61–1.24)
GFR, Estimated: >60 mL/min (ref >60)
Glucose, Bld: 113 mg/dL — ABNORMAL HIGH (ref 70–99)
Potassium: 3.8 mmol/L (ref 3.5–5.1)
Sodium: 137 mmol/L (ref 135–145)
Total Bilirubin: 1.1 mg/dL (ref 0.0–1.2)
Total Protein: 7.2 g/dL (ref 6.5–8.1)

## 2024-03-29 LAB — PSA: Prostatic Specific Antigen: 0.02 ng/mL (ref 0.00–4.00)

## 2024-03-29 NOTE — Assessment & Plan Note (Addendum)
 Stage IV metastatic prostate cancer.  NGS showed -no potentially actionable variants. He tolerates Abiraterone  1000mg  daily with prednisone  5mg  daily.  Continue current regimen.  PSA 0.02 Continue monitor PSA and plan repeat imaging if PSA rises.

## 2024-03-29 NOTE — Progress Notes (Signed)
 Hematology/Oncology Progress note [phone] [phone]           REFERRING PROVIDER: McDiarmid, Krystal BIRCH, MD   CHIEF COMPLAINTS/REASON FOR VISIT:  Metastatic prostate cancer   ASSESSMENT & PLAN:   Cancer Staging  Prostate cancer [facility]) Staging form: Prostate, AJCC 8th Edition - Clinical stage from 03/21/2023: Stage IVA (cT3b, cN1, cM0, PSA: 167) - Signed by Babara Call, MD on 03/21/2023   Prostate cancer (HCC) Stage IV metastatic prostate cancer.  NGS showed -no potentially actionable variants. He tolerates Abiraterone  1000mg  daily with prednisone  5mg  daily.  Continue current regimen.  PSA 0.02 Continue monitor PSA and plan repeat imaging if PSA rises.   Encounter for monitoring androgen deprivation therapy Eligard  45mg  Q6 months next due Sept  2025   Metastasis to bone [facility]) lower back pain has resolved.  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  Avoid heavy weight lifting.     Orders Placed This Encounter  Procedures   CMP (Cancer Center only)    Standing Status:   Future    Expected Date:   06/29/2024    Expiration Date:   09/27/2024   CBC with Differential (Cancer Center Only)    Standing Status:   Future    Expected Date:   06/29/2024    Expiration Date:   09/27/2024   PSA    Standing Status:   Future    Expected Date:   06/29/2024    Expiration Date:   09/27/2024   Follow-up 3 months All questions were answered. The patient knows to call the clinic with any problems, questions or concerns.  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  Oncology History  Prostate cancer (HCC)  02/21/2023 Imaging   CT angiogram chest aorta with contrast showed 1. No acute intrathoracic  abnormality. 2. 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 Continue annual imaging followup by CTA or MRA. This recommendation follows 2010 ACCF/AHA/AATS/ACR/ASA/SCA/SCAI/SIR/STS/SVM Guidelines for the Diagnosis and Management of Patients with Thoracic Aortic Disease. Circulation. 2010; 121: Z733-z630. Aortic aneurysm NOS (ICD10-I71.9) 3. Severe burden multivessel coronary atherosclerosis, greatest within the LAD. 4. Enlarged RIGHT hilar lymph nodes, suspicious. RIGHT posterior apical scarring without a discrete nodule. No suspicious mass within the RIGHT lung.   03/11/2023 Initial Diagnosis   Prostate cancer [facility])  April 2024, patient's PSA was elevated 167. Patient saw urologist.  Status post prostate biopsy.  Results were not available in epic.  Per patient he was diagnosed with prostate cancer.   03/14/2023 Imaging   PSMA PET scan showed 1. Intense radiotracer activity involving the near entirety of the RIGHT lobe of the prostate gland with extension to the LEFT lobe consistent with primary prostate adenocarcinoma. 2. Intense radiotracer activity extends into the RIGHT seminal vesicle consistent with nodal metastasis. 3. Intense radiotracer avid lymph nodes in the pelvis consistent with nodal metastasis. 4. Intense radiotracer avid metastatic adenopathy in the RIGHT lower paratracheal space consistent with mediastinal nodal metastasis. 5. Intense radiotracer avid metastatic disease to the skeleton with involvement of the thoracic spine, iliac bones, and RIGHT acromion process. 6. Nodular implant beneath the RIGHT diaphragm at the level of the upper pole of the RIGHT kidney consistent with metastatic implant.  03/21/2023 Cancer Staging   Staging form: Prostate, AJCC 8th Edition - Clinical stage from 03/21/2023: Stage IVA (cT3b, cN1, cM0, PSA: 167) - Signed by Babara Call, MD on 03/21/2023 Stage prefix: Initial diagnosis Prostate specific antigen (PSA) range: 20 or  greater   03/21/2023 Tumor Marker   PSA 329   04/21/2023 Tumor Marker   PSA 7.84   04/25/2023 -  Chemotherapy   Patient started on Abiaterone 1000mg  daily and Prednisone  5mg  daily.      He has established care with Urology, and has started on Flomax . Urinary urgency has improved.  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  Today he reports feeling ok  Overall he tolerates Abiaterone and prednisone  regimen + Fatigue and hot flash     MEDICAL HISTORY:  Past Medical History:  Diagnosis Date   Abdominal wall hernia 03/20/2018   ATTENTION DEFICIT, W/O HYPERACTIVITY 10/30/2006   Qualifier: History of  By: McDiarmid MD, Todd     Cataract    bilateral cataracts with lens implant   Chest pain, atypical 12/05/2017   COLON POLYP 02/07/2014   Tubular Adenoma x 1 - Dr Teressa (GI)   Diverticulosis of colon 07/09/2018   Based on colonoscopy 06/2108   Encounter for neuropsychological testing 06/26/2015   [facility]: Only B/L avg in rote memory auditory info encoding. No mood dysfnc   Encounter for neuropsychological testing 2008   Michael Zelsen (Neuropsy): Mixed anxiety and depression o/w normal.    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  Repeat testing 06/2015 at [facility]: Only finding was below average in rote memory auditory info encoding. No mood dysfunction.    Obesity (BMI 30.0-34.9) 10/30/2006   Qualifier: History  of   By: McDiarmid MD, Todd       Overweight (BMI 25.0-29.9) 10/30/2006   Qualifier: History of  By: McDiarmid MD, Todd     Prediabetes 12/21/2013   Pulmonary nodule, left 06/09/2015   Pure hypercholesterolemia 10/30/2006   ACC calculation 10-yr risk of CV event of 9.5% with recommendation to consider Statin therapy of moderate-to-high potency (12/21/13)     Rotator cuff tear 12/08/2020   Shoulder injury, right, initial encounter 11/02/2020    SURGICAL HISTORY: Past Surgical History:  Procedure Laterality Date   CATARACT EXTRACTION W/ INTRAOCULAR LENS  IMPLANT, BILATERAL     COCCYGECTOMY  1985   VASECTOMY      SOCIAL HISTORY: Social History   Socioeconomic History   Marital status: Married    Spouse name: Devere   Number of children: 4   Years of education: >20   Highest education level: Not on file  Occupational History   Occupation: Education officer, community   Occupation: Pharmacist  Tobacco Use   Smoking status: Never   Smokeless tobacco: Never  Vaping Use   Vaping status: Never Used  Substance and Sexual Activity   Alcohol use: Yes    Alcohol/week: 3.0 standard drinks of alcohol    Types: 3  drink(s) per week   Drug use: No   Sexual activity: Yes    Comment: monogamous  Other Topics Concern   Not on file  Social History Narrative   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   4 adult sons ([place], [place] , [place] ): 3 biological and 1 step-child   Patient is third of six children   No formal HCPOA executed (12/21/13)   No Advanced Directive executed (12/21/13)   He and wife are raising a granddaughter (2016)   Hobbies: Motorcycling and Horse riding.         Social Drivers of Corporate investment banker Strain: Not on file  Food Insecurity: No Food Insecurity (03/21/2023)   Hunger Vital Sign    Worried About Running Out of Food in the Last Year: Never true    Ran Out of Food in the Last Year: Never true  Transportation Needs: No Transportation Needs (03/21/2023)    PRAPARE - Administrator, Civil Service (Medical): No    Lack of Transportation (Non-Medical): No  Physical Activity: Not on file  Stress: Not on file  Social Connections: Not on file  Intimate Partner Violence: Not At Risk (03/21/2023)   Humiliation, Afraid, Rape, and Kick questionnaire    Fear of Current or Ex-Partner: No    Emotionally Abused: No    Physically Abused: No    Sexually Abused: No    FAMILY HISTORY: Family History  Problem Relation Age of Onset   Hypertension Mother    Stroke Mother 52   Dementia Mother 72   Diabetes Father    Diabetes Sister    Diabetes Brother    Esophageal cancer Brother    Lymphoma Brother    Esophageal cancer Brother    Lymphoma Brother    Diabetes Brother    Colon cancer Neg Hx     ALLERGIES:  is allergic to cephalosporins.  MEDICATIONS:  Current Outpatient Medications  Medication Sig Dispense Refill   abiraterone  acetate (ZYTIGA ) 250 MG tablet Take 4 tablets (1,000 mg total) by mouth daily. Take on an empty stomach 1 hour before or 2 hours after a meal 120 tablet 2   aspirin  EC 81 MG tablet Take 1 tablet (81 mg total) by mouth daily. Swallow whole. 90 tablet 3   atorvastatin  (LIPITOR) 40 MG tablet Take 1 tablet (40 mg total) by mouth daily. 90 tablet 3   ibuprofen (ADVIL) 600 MG tablet Take 600 mg by mouth at bedtime.     lisinopril -hydrochlorothiazide  (ZESTORETIC ) 20-25 MG tablet Take 1 tablet by mouth daily. 90 tablet 3   predniSONE  (DELTASONE ) 5 MG tablet Take 1 tablet (5 mg total) by mouth daily with breakfast. 30 tablet 2   Vibegron  (GEMTESA ) 75 MG TABS Take 1 tablet (75 mg total) by mouth daily. 30 tablet 11   metoprolol  tartrate (LOPRESSOR ) 100 MG tablet Take 1 tablet (100 mg total) by mouth once for 1 dose. TWO HOURS PRIOR TO CARDIAC CT (Patient not taking: Reported on 03/15/2024) 1 tablet 0   No current facility-administered medications for this visit.    Review of Systems  Constitutional:  Negative for  appetite change, chills, fatigue, fever and unexpected weight change.  HENT:   Negative for hearing loss and voice change.   Eyes:  Negative for eye problems and icterus.  Respiratory:  Negative for chest tightness, cough and shortness of breath.   Cardiovascular:  Negative for chest pain and leg swelling.  Gastrointestinal:  Negative  for abdominal distention and abdominal pain.  Endocrine: Negative for hot flashes.  Genitourinary:  Negative for difficulty urinating, dysuria and frequency.   Musculoskeletal:  Positive for arthralgias. Negative for back pain.  Skin:  Negative for itching and rash.  Neurological:  Negative for light-headedness and numbness.       Memory is not good  Hematological:  Negative for adenopathy. Does not bruise/bleed easily.  Psychiatric/Behavioral:  Negative for confusion.    PHYSICAL EXAMINATION: ECOG PERFORMANCE STATUS: 0 - Asymptomatic Vitals:   03/29/24 1042  BP: 120/71  Pulse: 75  Resp: 18  Temp: (!) 97.4 F (36.3 C)   Filed Weights   03/29/24 1042  Weight: 186 lb 14.4 oz (84.8 kg)    Physical Exam Constitutional:      General: He is not in acute distress. HENT:     Head: Normocephalic and atraumatic.  Eyes:     General: No scleral icterus. Cardiovascular:     Rate and Rhythm: Normal rate and regular rhythm.  Pulmonary:     Effort: Pulmonary effort is normal. No respiratory distress.  Abdominal:     General: There is no distension.     Palpations: Abdomen is soft.  Musculoskeletal:        General: No deformity. Normal range of motion.     Cervical back: Normal range of motion and neck supple.  Skin:    Findings: No erythema or rash.  Neurological:     Mental Status: He is alert and oriented to person, place, and time. Mental status is at baseline.  Psychiatric:        Mood and Affect: Mood normal.     LABORATORY DATA:  I have reviewed the data as listed    Latest Ref Rng & Units 03/29/2024   10:26 AM 12/25/2023   10:21 AM  10/13/2023    1:46 PM  CBC  WBC 4.0 - 10.5 K/uL 9.2  6.6  8.0   Hemoglobin 13.0 - 17.0 g/dL 85.9  86.4  86.2   Hematocrit 39.0 - 52.0 % 41.9  40.4  41.5   Platelets 150 - 400 K/uL 205  212  212       Latest Ref Rng & Units 03/29/2024   10:25 AM 12/25/2023   10:20 AM 10/27/2023    9:18 AM  CMP  Glucose 70 - 99 mg/dL 886  886  894   BUN 8 - 23 mg/dL 25  22  26    Creatinine 0.61 - 1.24 mg/dL 8.84  9.03  8.97   Sodium 135 - 145 mmol/L 137  139  142   Potassium 3.5 - 5.1 mmol/L 3.8  3.6  4.8   Chloride 98 - 111 mmol/L 101  102  101   CO2 22 - 32 mmol/L 26  28  25    Calcium  8.9 - 10.3 mg/dL 9.8  9.5  9.7   Total Protein 6.5 - 8.1 g/dL 7.2  7.2    Total Bilirubin 0.0 - 1.2 mg/dL 1.1  1.1    Alkaline Phos 38 - 126 U/L 46  37    AST 15 - 41 U/L 31  24    ALT 0 - 44 U/L 30  19        RADIOGRAPHIC STUDIES: I have personally reviewed the radiological images as listed and agreed with the findings in the report. No results found.

## 2024-03-29 NOTE — Progress Notes (Signed)
 Pt here for follow up. No new concerns voiced.

## 2024-03-29 NOTE — Assessment & Plan Note (Signed)
 Eligard  45mg  Q6 months next due Sept  2025

## 2024-03-29 NOTE — Assessment & Plan Note (Signed)
 lower back pain has resolved.  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  Avoid heavy weight lifting.

## 2024-04-05 ENCOUNTER — Other Ambulatory Visit (HOSPITAL_COMMUNITY): Payer: Self-pay

## 2024-04-06 ENCOUNTER — Other Ambulatory Visit: Payer: Self-pay

## 2024-04-06 ENCOUNTER — Other Ambulatory Visit: Payer: Self-pay | Admitting: Oncology

## 2024-04-06 DIAGNOSIS — C61 Malignant neoplasm of prostate: Secondary | ICD-10-CM

## 2024-04-07 ENCOUNTER — Other Ambulatory Visit (HOSPITAL_COMMUNITY): Payer: Self-pay

## 2024-04-07 ENCOUNTER — Other Ambulatory Visit: Payer: Self-pay

## 2024-04-07 MED ORDER — ABIRATERONE ACETATE 250 MG PO TABS
1000.0000 mg | ORAL_TABLET | Freq: Every day | ORAL | 2 refills | Status: DC
  Filled 2024-04-07 – 2024-04-09 (×2): qty 120, 30d supply, fill #0
  Filled 2024-05-10: qty 120, 30d supply, fill #1
  Filled 2024-06-04: qty 120, 30d supply, fill #2

## 2024-04-09 ENCOUNTER — Other Ambulatory Visit: Payer: Self-pay | Admitting: Oncology

## 2024-04-09 ENCOUNTER — Other Ambulatory Visit: Payer: Self-pay

## 2024-04-09 DIAGNOSIS — C61 Malignant neoplasm of prostate: Secondary | ICD-10-CM

## 2024-04-09 NOTE — Progress Notes (Signed)
 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

## 2024-04-12 ENCOUNTER — Other Ambulatory Visit: Payer: Self-pay

## 2024-04-13 ENCOUNTER — Other Ambulatory Visit (HOSPITAL_BASED_OUTPATIENT_CLINIC_OR_DEPARTMENT_OTHER): Payer: Self-pay

## 2024-04-13 ENCOUNTER — Other Ambulatory Visit (HOSPITAL_COMMUNITY): Payer: Self-pay

## 2024-04-14 ENCOUNTER — Other Ambulatory Visit (HOSPITAL_COMMUNITY): Payer: Self-pay

## 2024-04-14 ENCOUNTER — Other Ambulatory Visit: Payer: Self-pay

## 2024-04-14 ENCOUNTER — Encounter: Payer: Self-pay | Admitting: Oncology

## 2024-04-14 MED ORDER — PREDNISONE 5 MG PO TABS
5.0000 mg | ORAL_TABLET | Freq: Every day | ORAL | 2 refills | Status: DC
  Filled 2024-04-14: qty 30, 30d supply, fill #0
  Filled 2024-05-10: qty 30, 30d supply, fill #1
  Filled 2024-06-04 – 2024-06-09 (×2): qty 30, 30d supply, fill #2

## 2024-04-16 ENCOUNTER — Encounter: Payer: Self-pay | Admitting: Oncology

## 2024-05-10 ENCOUNTER — Other Ambulatory Visit (HOSPITAL_COMMUNITY): Payer: Self-pay

## 2024-05-10 ENCOUNTER — Other Ambulatory Visit: Payer: Self-pay | Admitting: Pharmacy Technician

## 2024-05-10 ENCOUNTER — Other Ambulatory Visit: Payer: Self-pay

## 2024-05-10 NOTE — Progress Notes (Signed)
 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

## 2024-05-11 ENCOUNTER — Other Ambulatory Visit: Payer: Self-pay

## 2024-05-17 ENCOUNTER — Inpatient Hospital Stay: Attending: Oncology

## 2024-05-17 DIAGNOSIS — C7951 Secondary malignant neoplasm of bone: Secondary | ICD-10-CM | POA: Insufficient documentation

## 2024-05-17 DIAGNOSIS — C61 Malignant neoplasm of prostate: Secondary | ICD-10-CM | POA: Diagnosis not present

## 2024-05-17 MED ORDER — LEUPROLIDE ACETATE (6 MONTH) 45 MG ~~LOC~~ KIT
45.0000 mg | PACK | Freq: Once | SUBCUTANEOUS | Status: AC
  Administered 2024-05-17: 45 mg via SUBCUTANEOUS
  Filled 2024-05-17: qty 45

## 2024-05-18 ENCOUNTER — Encounter: Payer: Self-pay | Admitting: Oncology

## 2024-05-31 DIAGNOSIS — Z1589 Genetic susceptibility to other disease: Secondary | ICD-10-CM | POA: Diagnosis not present

## 2024-05-31 DIAGNOSIS — Z822 Family history of deafness and hearing loss: Secondary | ICD-10-CM | POA: Diagnosis not present

## 2024-05-31 DIAGNOSIS — Z8481 Family history of carrier of genetic disease: Secondary | ICD-10-CM | POA: Diagnosis not present

## 2024-05-31 DIAGNOSIS — Z1371 Encounter for nonprocreative screening for genetic disease carrier status: Secondary | ICD-10-CM | POA: Diagnosis not present

## 2024-06-04 ENCOUNTER — Encounter (INDEPENDENT_AMBULATORY_CARE_PROVIDER_SITE_OTHER): Payer: Self-pay

## 2024-06-04 ENCOUNTER — Telehealth: Payer: Self-pay | Admitting: Pharmacy Technician

## 2024-06-04 ENCOUNTER — Other Ambulatory Visit (HOSPITAL_COMMUNITY): Payer: Self-pay

## 2024-06-04 ENCOUNTER — Other Ambulatory Visit: Payer: Self-pay

## 2024-06-04 ENCOUNTER — Other Ambulatory Visit: Payer: Self-pay | Admitting: Pharmacy Technician

## 2024-06-04 NOTE — Progress Notes (Signed)
 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 Enter 11 by accident.  Medication will be filled on 06/10/24.

## 2024-06-04 NOTE — Telephone Encounter (Signed)
 Oral Oncology Patient Advocate Encounter  Delon Brow contacted me from the specialty pharmacy call center, she stated that when coordinating refill for the patient a technician noticed that the insurance price was now significantly cheaper than the cash price the patient was paying.  I have spoken with the patient and informed him of this and he stated he would like to have the medication filled through his insurance moving forward unless the price changes again.  Delon Brow stated she will coordinate with [facility] to refund the patient for the previous fill once he goes to pick up his refill.  Eldrige Pitkin (Patty) Chet Burnet, CPhT  [facility], Zelda Salmon, [facility] Hematology/Oncology - Oral Chemotherapy Patient Advocate Specialist III Phone: [phone]  Fax: [phone]

## 2024-06-09 ENCOUNTER — Other Ambulatory Visit: Payer: Self-pay

## 2024-06-15 ENCOUNTER — Other Ambulatory Visit (HOSPITAL_COMMUNITY): Payer: Self-pay

## 2024-06-16 ENCOUNTER — Other Ambulatory Visit: Payer: Self-pay

## 2024-06-16 ENCOUNTER — Other Ambulatory Visit (HOSPITAL_COMMUNITY): Payer: Self-pay

## 2024-06-28 ENCOUNTER — Encounter: Payer: Self-pay | Admitting: Oncology

## 2024-06-28 ENCOUNTER — Other Ambulatory Visit: Payer: Self-pay

## 2024-06-28 ENCOUNTER — Ambulatory Visit: Payer: Self-pay | Admitting: Oncology

## 2024-06-28 ENCOUNTER — Other Ambulatory Visit (HOSPITAL_COMMUNITY): Payer: Self-pay

## 2024-06-28 ENCOUNTER — Inpatient Hospital Stay: Attending: Oncology

## 2024-06-28 ENCOUNTER — Inpatient Hospital Stay: Admitting: Oncology

## 2024-06-28 VITALS — BP 126/71 | HR 83 | Temp 98.2°F | Resp 16 | Wt 191.4 lb

## 2024-06-28 DIAGNOSIS — Z7982 Long term (current) use of aspirin: Secondary | ICD-10-CM | POA: Diagnosis not present

## 2024-06-28 DIAGNOSIS — Z79818 Long term (current) use of other agents affecting estrogen receptors and estrogen levels: Secondary | ICD-10-CM

## 2024-06-28 DIAGNOSIS — Z833 Family history of diabetes mellitus: Secondary | ICD-10-CM

## 2024-06-28 DIAGNOSIS — R0602 Shortness of breath: Secondary | ICD-10-CM | POA: Diagnosis not present

## 2024-06-28 DIAGNOSIS — R0789 Other chest pain: Secondary | ICD-10-CM

## 2024-06-28 DIAGNOSIS — Z79899 Other long term (current) drug therapy: Secondary | ICD-10-CM | POA: Insufficient documentation

## 2024-06-28 DIAGNOSIS — Z7952 Long term (current) use of systemic steroids: Secondary | ICD-10-CM | POA: Diagnosis not present

## 2024-06-28 DIAGNOSIS — R7303 Prediabetes: Secondary | ICD-10-CM | POA: Insufficient documentation

## 2024-06-28 DIAGNOSIS — Z800 Family history of malignant neoplasm of digestive organs: Secondary | ICD-10-CM | POA: Diagnosis not present

## 2024-06-28 DIAGNOSIS — C61 Malignant neoplasm of prostate: Secondary | ICD-10-CM | POA: Diagnosis not present

## 2024-06-28 DIAGNOSIS — C7951 Secondary malignant neoplasm of bone: Secondary | ICD-10-CM | POA: Insufficient documentation

## 2024-06-28 DIAGNOSIS — Z807 Family history of other malignant neoplasms of lymphoid, hematopoietic and related tissues: Secondary | ICD-10-CM | POA: Diagnosis not present

## 2024-06-28 LAB — CMP (CANCER CENTER ONLY)
ALT: 24 U/L (ref 0–44)
AST: 25 U/L (ref 15–41)
Albumin: 4.1 g/dL (ref 3.5–5.0)
Alkaline Phosphatase: 45 U/L (ref 38–126)
Anion gap: 9 (ref 5–15)
BUN: 27 mg/dL — ABNORMAL HIGH (ref 8–23)
CO2: 27 mmol/L (ref 22–32)
Calcium: 9.6 mg/dL (ref 8.9–10.3)
Chloride: 102 mmol/L (ref 98–111)
Creatinine: 1.24 mg/dL (ref 0.61–1.24)
GFR, Estimated: 60 mL/min — ABNORMAL LOW (ref >60)
Glucose, Bld: 137 mg/dL — ABNORMAL HIGH (ref 70–99)
Potassium: 4.3 mmol/L (ref 3.5–5.1)
Sodium: 138 mmol/L (ref 135–145)
Total Bilirubin: 0.8 mg/dL (ref 0.0–1.2)
Total Protein: 6.9 g/dL (ref 6.5–8.1)

## 2024-06-28 LAB — CBC WITH DIFFERENTIAL (CANCER CENTER ONLY)
Abs Immature Granulocytes: 0.05 K/uL (ref 0.00–0.07)
Basophils Absolute: 0.1 K/uL (ref 0.0–0.1)
Basophils Relative: 1 %
Eosinophils Absolute: 0.1 K/uL (ref 0.0–0.5)
Eosinophils Relative: 1 %
HCT: 41.1 % (ref 39.0–52.0)
Hemoglobin: 13.4 g/dL (ref 13.0–17.0)
Immature Granulocytes: 1 %
Lymphocytes Relative: 29 %
Lymphs Abs: 2.7 K/uL (ref 0.7–4.0)
MCH: 27.6 pg (ref 26.0–34.0)
MCHC: 32.6 g/dL (ref 30.0–36.0)
MCV: 84.7 fL (ref 80.0–100.0)
Monocytes Absolute: 0.6 K/uL (ref 0.1–1.0)
Monocytes Relative: 6 %
Neutro Abs: 6 K/uL (ref 1.7–7.7)
Neutrophils Relative %: 62 %
Platelet Count: 217 K/uL (ref 150–400)
RBC: 4.85 MIL/uL (ref 4.22–5.81)
RDW: 13 % (ref 11.5–15.5)
WBC Count: 9.5 K/uL (ref 4.0–10.5)
nRBC: 0 % (ref 0.0–0.2)

## 2024-06-28 LAB — HEMOGLOBIN A1C
Hgb A1c MFr Bld: 6.1 % — ABNORMAL HIGH (ref 4.8–5.6)
Mean Plasma Glucose: 128.37 mg/dL

## 2024-06-28 LAB — PSA: Prostatic Specific Antigen: <0.02 ng/mL (ref 0.00–4.00)

## 2024-06-28 MED ORDER — ABIRATERONE ACETATE 250 MG PO TABS
1000.0000 mg | ORAL_TABLET | Freq: Every day | ORAL | 2 refills | Status: DC
  Filled 2024-06-28 – 2024-07-08 (×2): qty 120, 30d supply, fill #0
  Filled 2024-08-09 – 2024-08-13 (×2): qty 120, 30d supply, fill #1
  Filled 2024-09-08 – 2024-09-14 (×2): qty 120, 30d supply, fill #2

## 2024-06-28 MED ORDER — PREDNISONE 5 MG PO TABS
5.0000 mg | ORAL_TABLET | Freq: Every day | ORAL | 2 refills | Status: DC
  Filled 2024-06-28 – 2024-07-12 (×2): qty 30, 30d supply, fill #0
  Filled 2024-08-13: qty 30, 30d supply, fill #1
  Filled 2024-09-14: qty 30, 30d supply, fill #2

## 2024-06-28 NOTE — Assessment & Plan Note (Signed)
 lower back pain has resolved.  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  Avoid heavy weight lifting.

## 2024-06-28 NOTE — Assessment & Plan Note (Signed)
 Pt is prediabetic. On low dose prednisone .  Check A1c

## 2024-06-28 NOTE — Assessment & Plan Note (Addendum)
 SOB with exertion sometimes. Premature beats on physical examination.  Recommend patient to follow up cardiology for further evaluation.

## 2024-06-28 NOTE — Progress Notes (Signed)
 Hematology/Oncology Progress note [phone] [phone]           REFERRING PROVIDER: McDiarmid, Krystal BIRCH, MD   CHIEF COMPLAINTS/REASON FOR VISIT:  Metastatic prostate cancer   ASSESSMENT & PLAN:   Cancer Staging  Prostate cancer [facility]) Staging form: Prostate, AJCC 8th Edition - Clinical stage from 03/21/2023: Stage IVA (cT3b, cN1, cM0, PSA: 167) - Signed by Babara Call, MD on 03/21/2023   Prostate cancer (HCC) Stage IV metastatic prostate cancer.  NGS showed -no potentially actionable variants. He tolerates Abiraterone  1000mg  daily with prednisone  5mg  daily.  Continue current regimen.  PSA <0.02 Continue monitor PSA and plan repeat imaging if PSA rises.   Encounter for monitoring androgen deprivation therapy Eligard  45mg  Q6 months next due Sept  2025   Chest pressure SOB with exertion sometimes. Premature beats on physical examination.  Recommend patient to follow up cardiology for further evaluation.    Family history of diabetes mellitus Pt is prediabetic. On low dose prednisone .  Check A1c  Metastasis to bone [facility]) lower back pain has resolved.  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  Avoid heavy weight lifting.     Orders Placed This Encounter  Procedures   Hemoglobin A1c    Standing Status:   Future    Number of Occurrences:   1    Expected Date:   06/28/2024    Expiration Date:   06/28/2025   CMP (Cancer Center only)    Standing Status:   Future    Expected Date:   09/28/2024    Expiration Date:   12/27/2024   CBC with Differential (Cancer Center Only)    Standing Status:   Future    Expected Date:   09/28/2024    Expiration Date:   12/27/2024   PSA    Standing Status:   Future    Expected Date:   09/28/2024    Expiration Date:   12/27/2024   Follow-up 3 months All questions were answered. The patient knows to call the clinic with any problems, questions or concerns.  Call Babara, MD,  PhD [facility] Hematology Oncology 06/28/2024   HISTORY OF PRESENTING ILLNESS:   Christopher Wall, Christopher Wall is a  78 y.o.  male with PMH listed below was seen in consultation at the request of  McDiarmid, Krystal BIRCH, MD  for evaluation of prostate cancer.  Oncology History  Prostate cancer (HCC)  02/21/2023 Imaging   CT angiogram chest aorta with contrast showed 1. No acute intrathoracic abnormality. 2. 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 Continue annual imaging followup by CTA or MRA. This recommendation follows 2010 ACCF/AHA/AATS/ACR/ASA/SCA/SCAI/SIR/STS/SVM Guidelines for the Diagnosis and Management of Patients with Thoracic Aortic Disease. Circulation. 2010; 121: Z733-z630. Aortic aneurysm NOS (ICD10-I71.9) 3. Severe burden multivessel coronary atherosclerosis, greatest within the LAD. 4. Enlarged RIGHT hilar lymph nodes, suspicious. RIGHT posterior apical scarring without a discrete nodule. No suspicious mass within the RIGHT lung.   03/11/2023 Initial Diagnosis   Prostate cancer [facility])  April 2024, patient's PSA was elevated 167. Patient saw urologist.  Status post prostate biopsy.  Results were not available in epic.  Per patient he was diagnosed with prostate cancer.   03/14/2023 Imaging   PSMA PET scan showed 1. Intense radiotracer activity involving the near entirety of the RIGHT lobe of the prostate gland with extension to the LEFT lobe consistent with primary prostate adenocarcinoma. 2. Intense radiotracer activity extends into the RIGHT seminal vesicle consistent  with nodal metastasis. 3. Intense radiotracer avid lymph nodes in the pelvis consistent with nodal metastasis. 4. Intense radiotracer avid metastatic adenopathy in the RIGHT lower paratracheal space consistent with mediastinal nodal metastasis. 5. Intense radiotracer avid metastatic disease to the skeleton with involvement of the thoracic spine, iliac bones, and RIGHT acromion process. 6.  Nodular implant beneath the RIGHT diaphragm at the level of the upper pole of the RIGHT kidney consistent with metastatic implant.   03/21/2023 Cancer Staging   Staging form: Prostate, AJCC 8th Edition - Clinical stage from 03/21/2023: Stage IVA (cT3b, cN1, cM0, PSA: 167) - Signed by Babara Call, MD on 03/21/2023 Stage prefix: Initial diagnosis Prostate specific antigen (PSA) range: 20 or greater   03/21/2023 Tumor Marker   PSA 329   04/21/2023 Tumor Marker   PSA 7.84   04/25/2023 -  Chemotherapy   Patient started on Abiaterone 1000mg  daily and Prednisone  5mg  daily.      He has established care with Urology, and has started on Flomax . Urinary urgency has improved.  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  Today he reports feeling ok  Overall he tolerates Abiaterone and prednisone  regimen + Fatigue and hot flash He has noticed mild chest pressure.  Shortness of breath with some exertions but not other.  He feels no predictable. Patient would like to have A1c checked given his prediabetes history and family history of diabetes.     MEDICAL HISTORY:  Past Medical History:  Diagnosis Date   Abdominal wall hernia 03/20/2018   ATTENTION DEFICIT, W/O HYPERACTIVITY 10/30/2006   Qualifier: History of  By: McDiarmid MD, Krystal     Cataract    bilateral cataracts with lens implant   Chest pain, atypical 12/05/2017   COLON POLYP 02/07/2014   Tubular Adenoma x 1 - Dr Teressa (GI)   Diverticulosis of colon 07/09/2018   Based on colonoscopy 06/2108   Encounter for neuropsychological testing 06/26/2015   [facility]: Only B/L avg in rote memory auditory info encoding. No mood dysfnc   Encounter for neuropsychological testing 2008   Michael Zelsen (Neuropsy): Mixed anxiety and depression o/w normal.    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  Repeat testing 06/2015 at [facility]: Only finding was below average in rote memory auditory info encoding. No mood dysfunction.    Obesity (BMI 30.0-34.9) 10/30/2006   Qualifier: History of   By: McDiarmid MD, Todd       Overweight (BMI 25.0-29.9) 10/30/2006   Qualifier: History of  By: McDiarmid MD, Todd     Prediabetes 12/21/2013   Pulmonary nodule, left 06/09/2015   Pure hypercholesterolemia 10/30/2006   ACC calculation 10-yr risk of CV event of 9.5% with recommendation to consider Statin therapy of moderate-to-high potency (12/21/13)     Rotator cuff tear 12/08/2020   Shoulder injury, right, initial encounter 11/02/2020    SURGICAL HISTORY: Past Surgical History:  Procedure Laterality Date   CATARACT EXTRACTION W/ INTRAOCULAR  LENS  IMPLANT, BILATERAL     COCCYGECTOMY  1985   VASECTOMY      SOCIAL HISTORY: Social History   Socioeconomic History   Marital status: Married    Spouse name: Devere   Number of children: 4   Years of education: >20   Highest education level: Not on file  Occupational History   Occupation: Education Officer, Community   Occupation: Pharmacist  Tobacco Use   Smoking status: Never   Smokeless tobacco: Never  Vaping Use   Vaping status: Never Used  Substance and Sexual Activity   Alcohol use: Yes    Alcohol/week: 3.0 standard drinks of alcohol    Types: 3 drink(s) per week   Drug use: No   Sexual activity: Yes    Comment: monogamous  Other Topics Concern   Not on file  Social History Narrative   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   4 adult sons ([place], [place] , [place] ): 3 biological and 1 step-child   Patient is third of six children   No formal HCPOA executed (12/21/13)   No Advanced Directive executed (12/21/13)   He and wife are raising a granddaughter (2016)   Hobbies: Motorcycling and Horse riding.         Social Drivers of Corporate Investment Banker Strain: Not on file  Food Insecurity: No Food Insecurity (03/21/2023)   Hunger Vital Sign    Worried About Running Out of Food in the Last Year: Never true    Ran Out of Food in the Last Year: Never true  Transportation Needs: No Transportation Needs (03/21/2023)   PRAPARE - Administrator, Civil Service (Medical): No    Lack of Transportation (Non-Medical): No  Physical Activity: Not on file  Stress: Not on file  Social Connections: Not on file  Intimate Partner Violence: Not At Risk (03/21/2023)   Humiliation, Afraid, Rape, and Kick questionnaire    Fear of Current or Ex-Partner: No    Emotionally Abused: No    Physically Abused: No    Sexually Abused: No    FAMILY HISTORY: Family History  Problem Relation Age of Onset   Hypertension Mother    Stroke Mother 43   Dementia Mother 57   Diabetes Father    Diabetes Sister    Diabetes Brother    Esophageal cancer Brother    Lymphoma Brother    Esophageal cancer Brother    Lymphoma Brother    Diabetes Brother    Colon cancer Neg Hx     ALLERGIES:  is allergic to cephalosporins.  MEDICATIONS:  Current Outpatient Medications  Medication Sig Dispense Refill   aspirin  EC 81 MG tablet Take 1 tablet (81 mg total) by mouth daily. Swallow whole. 90 tablet 3   atorvastatin  (LIPITOR) 40 MG tablet Take 1 tablet (40 mg total) by mouth daily. 90 tablet 3   ibuprofen (ADVIL) 600 MG tablet Take 600 mg by mouth at bedtime.     lisinopril -hydrochlorothiazide  (ZESTORETIC ) 20-25 MG tablet Take 1 tablet by mouth daily. 90 tablet 3   Vibegron  (GEMTESA ) 75 MG TABS Take 1 tablet (75 mg total) by mouth  daily. 30 tablet 11   abiraterone  acetate (ZYTIGA ) 250 MG tablet Take 4 tablets (1,000 mg total) by mouth daily. Take on an empty stomach 1 hour before or 2 hours after a meal 120 tablet 2   predniSONE  (DELTASONE ) 5 MG tablet Take 1 tablet (5 mg total) by mouth daily with breakfast.  30 tablet 2   No current facility-administered medications for this visit.    Review of Systems  Constitutional:  Negative for appetite change, chills, fatigue, fever and unexpected weight change.  HENT:   Negative for hearing loss and voice change.   Eyes:  Negative for eye problems and icterus.  Respiratory:  Negative for chest tightness, cough and shortness of breath.   Cardiovascular:  Negative for chest pain and leg swelling.  Gastrointestinal:  Negative for abdominal distention and abdominal pain.  Endocrine: Negative for hot flashes.  Genitourinary:  Negative for difficulty urinating, dysuria and frequency.   Musculoskeletal:  Positive for arthralgias. Negative for back pain.  Skin:  Negative for itching and rash.  Neurological:  Negative for light-headedness and numbness.       Memory is not good  Hematological:  Negative for adenopathy. Does not bruise/bleed easily.  Psychiatric/Behavioral:  Negative for confusion.    PHYSICAL EXAMINATION: ECOG PERFORMANCE STATUS: 0 - Asymptomatic Vitals:   06/28/24 1019  BP: 126/71  Pulse: 83  Resp: 16  Temp: 98.2 F (36.8 C)  SpO2: 97%   Filed Weights   06/28/24 1019  Weight: 191 lb 6.4 oz (86.8 kg)    Physical Exam Constitutional:      General: He is not in acute distress. HENT:     Head: Normocephalic and atraumatic.  Eyes:     General: No scleral icterus. Cardiovascular:     Rate and Rhythm: Normal rate and regular rhythm.  Pulmonary:     Effort: Pulmonary effort is normal. No respiratory distress.  Abdominal:     General: There is no distension.     Palpations: Abdomen is soft.  Musculoskeletal:        General: No deformity. Normal  range of motion.     Cervical back: Normal range of motion and neck supple.  Skin:    Findings: No erythema or rash.  Neurological:     Mental Status: He is alert and oriented to person, place, and time. Mental status is at baseline.  Psychiatric:        Mood and Affect: Mood normal.     LABORATORY DATA:  I have reviewed the data as listed    Latest Ref Rng & Units 06/28/2024   10:10 AM 03/29/2024   10:26 AM 12/25/2023   10:21 AM  CBC  WBC 4.0 - 10.5 K/uL 9.5  9.2  6.6   Hemoglobin 13.0 - 17.0 g/dL 86.5  85.9  86.4   Hematocrit 39.0 - 52.0 % 41.1  41.9  40.4   Platelets 150 - 400 K/uL 217  205  212       Latest Ref Rng & Units 06/28/2024   10:10 AM 03/29/2024   10:25 AM 12/25/2023   10:20 AM  CMP  Glucose 70 - 99 mg/dL 862  886  886   BUN 8 - 23 mg/dL 27  25  22    Creatinine 0.61 - 1.24 mg/dL 8.75  8.84  9.03   Sodium 135 - 145 mmol/L 138  137  139   Potassium 3.5 - 5.1 mmol/L 4.3  3.8  3.6   Chloride 98 - 111 mmol/L 102  101  102   CO2 22 - 32 mmol/L 27  26  28    Calcium  8.9 - 10.3 mg/dL 9.6  9.8  9.5   Total Protein 6.5 - 8.1 g/dL 6.9  7.2  7.2   Total Bilirubin 0.0 - 1.2 mg/dL 0.8  1.1  1.1  Alkaline Phos 38 - 126 U/L 45  46  37   AST 15 - 41 U/L 25  31  24    ALT 0 - 44 U/L 24  30  19        RADIOGRAPHIC STUDIES: I have personally reviewed the radiological images as listed and agreed with the findings in the report. No results found.

## 2024-06-28 NOTE — Assessment & Plan Note (Signed)
 Eligard  45mg  Q6 months next due Sept  2025

## 2024-06-28 NOTE — Assessment & Plan Note (Addendum)
 Stage IV metastatic prostate cancer.  NGS showed -no potentially actionable variants. He tolerates Abiraterone  1000mg  daily with prednisone  5mg  daily.  Continue current regimen.  PSA 0.02 Continue monitor PSA and plan repeat imaging if PSA rises.

## 2024-07-08 ENCOUNTER — Other Ambulatory Visit: Payer: Self-pay

## 2024-07-08 ENCOUNTER — Other Ambulatory Visit (HOSPITAL_COMMUNITY): Payer: Self-pay

## 2024-07-12 ENCOUNTER — Other Ambulatory Visit (HOSPITAL_COMMUNITY): Payer: Self-pay

## 2024-07-12 ENCOUNTER — Other Ambulatory Visit: Payer: Self-pay

## 2024-07-12 ENCOUNTER — Encounter (INDEPENDENT_AMBULATORY_CARE_PROVIDER_SITE_OTHER): Payer: Self-pay

## 2024-07-13 ENCOUNTER — Other Ambulatory Visit: Payer: Self-pay

## 2024-07-13 NOTE — Progress Notes (Signed)
 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  Doses on hand: (Patient-Rptd) 7   Patient requested: (Patient-Rptd) Pickup at [facility] date: 07/16/24  Medication will be filled on 07/15/24.

## 2024-07-13 NOTE — Progress Notes (Signed)
 Clinical Intervention Note  Clinical Intervention Notes: Patient reported starting an iron supplement. No DDIs identified with abiraterone .   Clinical Intervention Outcomes: Prevention of an adverse drug event   Advertising Account Planner

## 2024-07-14 ENCOUNTER — Other Ambulatory Visit: Payer: Self-pay

## 2024-08-09 ENCOUNTER — Other Ambulatory Visit (HOSPITAL_COMMUNITY): Payer: Self-pay

## 2024-08-11 ENCOUNTER — Other Ambulatory Visit: Payer: Self-pay

## 2024-08-13 ENCOUNTER — Other Ambulatory Visit: Payer: Self-pay

## 2024-08-13 ENCOUNTER — Other Ambulatory Visit (HOSPITAL_COMMUNITY): Payer: Self-pay

## 2024-08-13 NOTE — Progress Notes (Signed)
 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

## 2024-08-17 ENCOUNTER — Other Ambulatory Visit: Payer: Self-pay

## 2024-08-17 NOTE — Progress Notes (Signed)
 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   Therapeutic benefit summary: Patient is achieving benefit   Adverse events/side effects summary: No adverse events/side effects   Patient's therapy is appropriate to: Continue    Goals Addressed             This Visit's Progress    Slow Disease Progression   On track    Patient is on track. Patient will maintain adherence. PSA has decreased to <0.02 ng/mL as of 06/28/24.          Follow up: 6 months  [facility]

## 2024-08-20 ENCOUNTER — Encounter: Payer: Self-pay | Admitting: Urology

## 2024-08-20 ENCOUNTER — Other Ambulatory Visit: Payer: Self-pay

## 2024-08-20 ENCOUNTER — Other Ambulatory Visit (HOSPITAL_COMMUNITY): Payer: Self-pay

## 2024-08-20 ENCOUNTER — Ambulatory Visit: Payer: Self-pay | Admitting: Urology

## 2024-08-20 VITALS — BP 137/79 | HR 71 | Ht 67.0 in | Wt 189.0 lb

## 2024-08-20 DIAGNOSIS — R350 Frequency of micturition: Secondary | ICD-10-CM | POA: Diagnosis not present

## 2024-08-20 DIAGNOSIS — R399 Unspecified symptoms and signs involving the genitourinary system: Secondary | ICD-10-CM

## 2024-08-20 LAB — MICROSCOPIC EXAMINATION

## 2024-08-20 LAB — URINALYSIS, COMPLETE
Bilirubin, UA: NEGATIVE
Glucose, UA: NEGATIVE
Ketones, UA: NEGATIVE
Leukocytes,UA: NEGATIVE
Nitrite, UA: NEGATIVE
Protein,UA: NEGATIVE
RBC, UA: NEGATIVE
Specific Gravity, UA: 1.025 (ref 1.005–1.030)
Urobilinogen, Ur: 0.2 mg/dL (ref 0.2–1.0)
pH, UA: 6 (ref 5.0–7.5)

## 2024-08-20 MED ORDER — GEMTESA 75 MG PO TABS
75.0000 mg | ORAL_TABLET | Freq: Every day | ORAL | 11 refills | Status: AC
  Filled 2024-08-20 – 2024-09-16 (×2): qty 30, 30d supply, fill #0

## 2024-08-20 NOTE — Progress Notes (Signed)
 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  Does not feel Gemtesa  is as effective as when he initially started but still notes significant symptom improvement Following medical oncology for metastatic prostate cancer on ADT + abiraterone  + prednisone  Last PSA October 2025 was undetectable No dysuria or gross hematuria   PMH: Past Medical History:  Diagnosis Date   Abdominal wall hernia 03/20/2018   ATTENTION DEFICIT, W/O HYPERACTIVITY 10/30/2006   Qualifier: History of  By: McDiarmid MD, Krystal     Cataract    bilateral cataracts with lens implant   Chest pain, atypical 12/05/2017   COLON POLYP 02/07/2014   Tubular Adenoma x 1 - Dr Teressa (GI)   Diverticulosis of colon 07/09/2018   Based on colonoscopy 06/2108   Encounter for neuropsychological testing 06/26/2015   [facility]: Only B/L avg in rote memory auditory info encoding. No mood dysfnc   Encounter for neuropsychological testing 2008   Michael Zelsen (Neuropsy): Mixed anxiety and depression o/w normal.    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  Repeat testing 06/2015 at [facility]: Only finding was below average in rote memory auditory info encoding. No  mood dysfunction.    Obesity (BMI 30.0-34.9) 10/30/2006   Qualifier: History of   By: McDiarmid MD, Todd       Overweight (BMI 25.0-29.9) 10/30/2006   Qualifier: History of  By: McDiarmid MD, Todd     Prediabetes 12/21/2013   Pulmonary nodule, left 06/09/2015   Pure hypercholesterolemia 10/30/2006   ACC calculation 10-yr risk of CV event of 9.5% with recommendation to consider Statin therapy of moderate-to-high potency (12/21/13)     Rotator cuff tear 12/08/2020   Shoulder injury, right, initial encounter 11/02/2020    Surgical History: Past Surgical History:  Procedure Laterality Date   CATARACT EXTRACTION W/ INTRAOCULAR LENS  IMPLANT, BILATERAL     COCCYGECTOMY  1985   VASECTOMY      Home Medications:  Allergies as of 08/20/2024       Reactions   Cephalosporins Rash        Medication List        Accurate as of August 20, 2024  9:53 AM. If you have any questions, ask your nurse or doctor.          abiraterone  acetate 250 MG tablet Commonly known as: ZYTIGA  Take 4 tablets (1,000 mg total) by mouth daily. Take on an empty stomach 1 hour before or 2 hours after a meal   aspirin  EC 81 MG tablet Take 1 tablet (81 mg total) by mouth daily. Swallow whole.   atorvastatin  40 MG tablet Commonly known as: LIPITOR Take 1 tablet (40 mg total) by mouth daily.   Gemtesa  75 MG  Tabs Generic drug: Vibegron  Take 1 tablet (75 mg total) by mouth daily.   ibuprofen 600 MG tablet Commonly known as: ADVIL Take 600 mg by mouth at bedtime.   lisinopril -hydrochlorothiazide  20-25 MG tablet Commonly known as: ZESTORETIC  Take 1 tablet by mouth daily.   predniSONE  5 MG tablet Commonly known as: DELTASONE  Take 1 tablet (5 mg total) by mouth daily with breakfast.        Allergies: Allergies[1]  Family History: Family History  Problem Relation Age of Onset   Hypertension Mother    Stroke Mother 68   Dementia Mother 41   Diabetes Father    Diabetes Sister    Diabetes  Brother    Esophageal cancer Brother    Lymphoma Brother    Esophageal cancer Brother    Lymphoma Brother    Diabetes Brother    Colon cancer Neg Hx     Social History:  reports that he has never smoked. He has never used smokeless tobacco. He reports current alcohol use of about 3.0 standard drinks of alcohol per week. He reports that he does not use drugs.   Physical Exam: BP 137/79   Pulse 71   Ht 5' 7 (1.702 m)   Wt 189 lb (85.7 kg)   BMI 29.60 kg/m   Constitutional:  Alert, No acute distress. HEENT: [place] AT Respiratory: Normal respiratory effort, no increased work of breathing. Psychiatric: Normal mood and affect.  Laboratory Data:  Urinalysis Dipstick/microscopy negative   Assessment & Plan:    1. Lower urinary tract symptoms  Storage related voiding symptoms; doing well on Gemtesa  Refill sent to pharmacy Continue annual follow-up  2.  Prostate cancer Continue medical oncology follow-up   [name], MD  [facility] [address] [place], [place] [address] [phone]    [1]  Allergies Allergen Reactions   Cephalosporins Rash   "

## 2024-08-30 ENCOUNTER — Other Ambulatory Visit: Payer: Self-pay | Admitting: Family Medicine

## 2024-09-06 ENCOUNTER — Other Ambulatory Visit (HOSPITAL_COMMUNITY): Payer: Self-pay

## 2024-09-06 MED ORDER — LISINOPRIL-HYDROCHLOROTHIAZIDE 20-25 MG PO TABS
1.0000 | ORAL_TABLET | Freq: Every day | ORAL | 0 refills | Status: AC
  Filled 2024-09-06: qty 90, 90d supply, fill #0

## 2024-09-07 ENCOUNTER — Telehealth: Payer: Self-pay

## 2024-09-07 NOTE — Telephone Encounter (Signed)
 Attempted to reach patient. No answer. LVM on home phone to call the office to make appt with. Dr. McDiarmid for  future medication refills.Nelson Land, CMA

## 2024-09-08 ENCOUNTER — Other Ambulatory Visit: Payer: Self-pay

## 2024-09-09 ENCOUNTER — Other Ambulatory Visit (HOSPITAL_COMMUNITY): Payer: Self-pay

## 2024-09-09 NOTE — Telephone Encounter (Signed)
 Spoke with patient and made appt for 1/22 for future medication refills. Nelson Land, CMA

## 2024-09-10 ENCOUNTER — Telehealth: Payer: Self-pay | Admitting: Pharmacy Technician

## 2024-09-10 ENCOUNTER — Other Ambulatory Visit (HOSPITAL_COMMUNITY): Payer: Self-pay

## 2024-09-10 NOTE — Telephone Encounter (Signed)
 Oral Oncology Patient Advocate Encounter  **Pending dx verification**  Was successful in securing patient a $6000 grant from [facility] to provide copayment coverage for abiraterone .  This will keep the out of pocket expense at $0.     [employer] ID: [id number]   The billing information is as follows and will be shared with [facility].    RxBin: [id number] PCN: [id number] Member ID: [id number] Group ID: [id number] Dates of Eligibility: 08/10/2024 through 08/09/2025  Fund:  Prostate Cancer - Medicare Access  [place] ([place]) Chet [place]et, CPhT  [facility], Zelda Salmon, [facility] Hematology/Oncology - Oral Chemotherapy Patient Advocate Specialist III Phone: [phone]  Fax: [phone]

## 2024-09-13 ENCOUNTER — Other Ambulatory Visit: Payer: Self-pay

## 2024-09-14 ENCOUNTER — Other Ambulatory Visit: Payer: Self-pay

## 2024-09-14 NOTE — Progress Notes (Signed)
 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

## 2024-09-16 ENCOUNTER — Other Ambulatory Visit (HOSPITAL_COMMUNITY): Payer: Self-pay

## 2024-09-16 ENCOUNTER — Other Ambulatory Visit: Payer: Self-pay

## 2024-09-22 NOTE — Progress Notes (Unsigned)
 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    Patient was initially evaluated by Dr. Darliss 10/2023 in the setting of chest pain, not associated with exertion.  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  Coronary CTA 11/2023 revealed calcium  score of 3577 which was 94th percentile for age and sex matched control with severe proximal LAD stenosis and moderate proximal RCA stenosis.  CT FFR analysis showed significant stenosis in the mid LAD and proximal OM1 with cardiac catheterization recommended.  Patient was seen in follow-up with Dr. Darliss 12/2023 with ongoing occasional chest pressure.  Of note, he has metastatic prostate cancer and felt that chest discomfort could be secondary to bony metastasis.  Patient preferred to discuss cancer prognosis with his oncologist prior to proceeding with LHC.   Patient was most recently seen by Dr. Darliss 03/2024 reporting ongoing occasional pain.  He had not yet started taking aspirin .  LHC was again discussed but patient deferred.  He also declined antianginal therapy.  ***  Labs independently reviewed: 06/2024-Hgb 13.4, HCT 41.1, platelets 217, sodium 138, potassium 4.3, BUN 27, creatinine 1.24, normal LFTs 12/2022-TC 208, TG 172, HDL 67, LDL 111  Objective   Past Medical History:  Diagnosis Date   Abdominal wall hernia 03/20/2018   ATTENTION DEFICIT, W/O HYPERACTIVITY 10/30/2006   Qualifier: History of  By: McDiarmid MD, Todd     Cataract    bilateral cataracts with lens implant   Chest pain, atypical 12/05/2017    COLON POLYP 02/07/2014   Tubular Adenoma x 1 - Dr Teressa (GI)   Diverticulosis of colon 07/09/2018   Based on colonoscopy 06/2108   Encounter for neuropsychological testing 06/26/2015   [facility]: Only B/L avg in rote memory auditory info encoding. No mood dysfnc   Encounter for neuropsychological testing 2008   Michael Zelsen (Neuropsy): Mixed anxiety and depression o/w normal.    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  Repeat testing 06/2015 at [facility]: Only finding was below average in rote memory auditory info encoding. No mood dysfunction.    Obesity (BMI 30.0-34.9) 10/30/2006   Qualifier: History of   By: McDiarmid MD, Todd       Overweight (BMI 25.0-29.9) 10/30/2006   Qualifier: History of  By: McDiarmid MD, Todd     Prediabetes 12/21/2013   Pulmonary nodule, left 06/09/2015   Pure hypercholesterolemia 10/30/2006   ACC calculation 10-yr risk of CV event of 9.5% with recommendation to consider Statin therapy of moderate-to-high potency (12/21/13)  Rotator cuff tear 12/08/2020   Shoulder injury, right, initial encounter 11/02/2020    Current Medications: Active Medications[1]  Allergies:   Cephalosporins   Social History   Socioeconomic History   Marital status: Married    Spouse name: Devere   Number of children: 4   Years of education: >20   Highest education level: Not on file  Occupational History   Occupation: Education Officer, Community   Occupation:  Pharmacist  Tobacco Use   Smoking status: Never   Smokeless tobacco: Never  Vaping Use   Vaping status: Never Used  Substance and Sexual Activity   Alcohol use: Yes    Alcohol/week: 3.0 standard drinks of alcohol    Types: 3 drink(s) per week   Drug use: No   Sexual activity: Yes    Comment: monogamous  Other Topics Concern   Not on file  Social History Narrative   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   4 adult sons ([place], [place] , [place] ): 3 biological and 1 step-child   Patient is third of six children   No formal HCPOA executed (12/21/13)   No Advanced Directive executed (12/21/13)   He and wife are raising a granddaughter (2016)   Hobbies: Motorcycling and Horse riding.         Social Drivers of Health   Tobacco Use: Low Risk (08/20/2024)   Patient History    Smoking Tobacco Use: Never    Smokeless Tobacco Use: Never    Passive Exposure: Not on file  Financial Resource Strain: Not on file  Food Insecurity: No Food Insecurity (03/21/2023)   Hunger Vital Sign    Worried About Running Out of Food in the Last Year: Never true    Ran Out of Food in the Last Year: Never true  Transportation Needs: No Transportation Needs (03/21/2023)   PRAPARE - Administrator, Civil Service (Medical): No    Lack of Transportation (Non-Medical): No  Physical Activity: Not on file  Stress: Not on file  Social Connections: Not on file  Depression (PHQ2-9): Low Risk (06/28/2024)   Depression (PHQ2-9)    PHQ-2 Score: 0  Alcohol Screen: Not on file  Housing: Low Risk (03/21/2023)   Housing    Last Housing Risk Score: 0  Utilities: Not At Risk (03/21/2023)   AHC Utilities    Threatened with loss of utilities: No  Health Literacy: Not on file     Family History:  The patient's family history includes Dementia (age of onset: 19) in his mother; Diabetes in his brother, brother, father, and sister; Esophageal cancer in his brother and brother; Hypertension in his  mother; Lymphoma in his brother and brother; Stroke (age of onset: 63) in his mother. There is no history of Colon cancer.  ROS:   12-point review of systems is negative unless otherwise noted in the HPI.  EKGs/Other Studies Reviewed:    Studies reviewed were summarized above. The additional studies were reviewed today:  11/2023 Coronary CTA 1. Coronary calcium  score of 3577. This was 94th percentile for age and sex matched control. 2. Normal coronary origin with right dominance. 3. Severe proximal LAD stenosis (>70%). 4. Moderate proximal RCA stenosis (50-69%). 5. CAD-RADS 4 Severe stenosis. (70-99% or > 50% left main). Cardiac catheterization is recommended. Consider symptom-guided anti-ischemic pharmacotherapy as well as risk factor modification per guideline directed care 1. CT FFR analysis showed significant stenosis in the mid LAD and proximal OM1. 2.  Cardiac catheterization recommended  11/2023  2D echo 1. Left ventricular ejection fraction, by estimation, is 60 to 65%. The left ventricle has normal function. The left ventricle has no regional wall motion abnormalities. Left ventricular diastolic parameters are consistent with Grade I diastolic dysfunction (impaired relaxation).   2. Right ventricular systolic function is normal. The right ventricular size is normal.   3. The mitral valve is degenerative. No evidence of mitral valve regurgitation.   4. The aortic valve is tricuspid. Aortic valve regurgitation is mild. Aortic valve sclerosis/calcification is present, without any evidence of aortic stenosis. Aortic valve mean gradient measures 6.0 mmHg.   5. Aortic dilatation noted. There is mild dilatation of the ascending aorta, measuring 40 mm.   6. The inferior vena cava is normal in size with greater than 50% respiratory variability, suggesting right atrial pressure of 3 mmHg.   EKG:  EKG personally reviewed by me today    PHYSICAL EXAM:    VS:  There were no vitals taken for  this visit.  BMI: There is no height or weight on file to calculate BMI.  GEN: Well nourished, well developed in no acute distress NECK: No JVD; No carotid bruits CARDIAC: ***RRR, no murmurs, rubs, gallops RESPIRATORY:  Clear to auscultation without rales, wheezing or rhonchi  ABDOMEN: Soft, non-tender, non-distended EXTREMITIES:  *** No edema; No deformity  Wt Readings from Last 3 Encounters:  08/20/24 189 lb (85.7 kg)  06/28/24 191 lb 6.4 oz (86.8 kg)  03/29/24 186 lb 14.4 oz (84.8 kg)                  ASSESSMENT & PLAN:   Coronary artery disease   Hypertension   Hyperlipidemia   Ascending aorta dilation   {Are you ordering a CV Procedure (e.g. stress test, cath, DCCV, TEE, etc)?   Press F2        :[id number]}   Disposition: F/u with Dr. Darliss or an APP in ***.   Medication Adjustments/Labs and Tests Ordered: Current medicines are reviewed at length with the patient today.  Concerns regarding medicines are outlined above. Medication changes, Labs and Tests ordered today are summarized above and listed in the Patient Instructions accessible in Encounters.   Bonney Lesley Maffucci, PA-C 09/22/2024 4:13 PM     [facility] [place] [address] [place], [place] [address] [phone]      [1]  No outpatient medications have been marked as taking for the 09/24/24 encounter (Appointment) with Maffucci Lesley CROME, PA-C.   "

## 2024-09-23 ENCOUNTER — Telehealth: Payer: Self-pay | Admitting: Oncology

## 2024-09-23 NOTE — Telephone Encounter (Signed)
 Due to weather, MD doing virtual only on Monday. Lab r/s to tomorrow and virtual confirmed on Monday with pt

## 2024-09-24 ENCOUNTER — Other Ambulatory Visit (HOSPITAL_COMMUNITY): Payer: Self-pay

## 2024-09-24 ENCOUNTER — Ambulatory Visit: Admitting: Physician Assistant

## 2024-09-24 ENCOUNTER — Other Ambulatory Visit: Payer: Self-pay

## 2024-09-24 ENCOUNTER — Encounter: Payer: Self-pay | Admitting: Oncology

## 2024-09-24 ENCOUNTER — Inpatient Hospital Stay: Attending: Oncology

## 2024-09-24 DIAGNOSIS — C7951 Secondary malignant neoplasm of bone: Secondary | ICD-10-CM | POA: Insufficient documentation

## 2024-09-24 DIAGNOSIS — C61 Malignant neoplasm of prostate: Secondary | ICD-10-CM | POA: Diagnosis present

## 2024-09-24 DIAGNOSIS — Z807 Family history of other malignant neoplasms of lymphoid, hematopoietic and related tissues: Secondary | ICD-10-CM | POA: Diagnosis not present

## 2024-09-24 DIAGNOSIS — Z800 Family history of malignant neoplasm of digestive organs: Secondary | ICD-10-CM | POA: Insufficient documentation

## 2024-09-24 DIAGNOSIS — Z7982 Long term (current) use of aspirin: Secondary | ICD-10-CM | POA: Insufficient documentation

## 2024-09-24 DIAGNOSIS — Z79899 Other long term (current) drug therapy: Secondary | ICD-10-CM | POA: Diagnosis not present

## 2024-09-24 LAB — CMP (CANCER CENTER ONLY)
ALT: 24 U/L (ref 0–44)
AST: 25 U/L (ref 15–41)
Albumin: 4.3 g/dL (ref 3.5–5.0)
Alkaline Phosphatase: 48 U/L (ref 38–126)
Anion gap: 10 (ref 5–15)
BUN: 25 mg/dL — ABNORMAL HIGH (ref 8–23)
CO2: 25 mmol/L (ref 22–32)
Calcium: 9.7 mg/dL (ref 8.9–10.3)
Chloride: 105 mmol/L (ref 98–111)
Creatinine: 1 mg/dL (ref 0.61–1.24)
GFR, Estimated: >60 mL/min
Glucose, Bld: 110 mg/dL — ABNORMAL HIGH (ref 70–99)
Potassium: 3.7 mmol/L (ref 3.5–5.1)
Sodium: 140 mmol/L (ref 135–145)
Total Bilirubin: 0.4 mg/dL (ref 0.0–1.2)
Total Protein: 6.7 g/dL (ref 6.5–8.1)

## 2024-09-24 LAB — CBC WITH DIFFERENTIAL (CANCER CENTER ONLY)
Abs Immature Granulocytes: 0.02 K/uL (ref 0.00–0.07)
Basophils Absolute: 0 K/uL (ref 0.0–0.1)
Basophils Relative: 1 %
Eosinophils Absolute: 0.1 K/uL (ref 0.0–0.5)
Eosinophils Relative: 1 %
HCT: 39.4 % (ref 39.0–52.0)
Hemoglobin: 13.1 g/dL (ref 13.0–17.0)
Immature Granulocytes: 0 %
Lymphocytes Relative: 43 %
Lymphs Abs: 2.8 K/uL (ref 0.7–4.0)
MCH: 27.6 pg (ref 26.0–34.0)
MCHC: 33.2 g/dL (ref 30.0–36.0)
MCV: 83.1 fL (ref 80.0–100.0)
Monocytes Absolute: 0.6 K/uL (ref 0.1–1.0)
Monocytes Relative: 9 %
Neutro Abs: 3 K/uL (ref 1.7–7.7)
Neutrophils Relative %: 46 %
Platelet Count: 183 K/uL (ref 150–400)
RBC: 4.74 MIL/uL (ref 4.22–5.81)
RDW: 12.7 % (ref 11.5–15.5)
WBC Count: 6.5 K/uL (ref 4.0–10.5)
nRBC: 0 % (ref 0.0–0.2)

## 2024-09-24 LAB — PSA: Prostatic Specific Antigen: <0.02 ng/mL (ref 0.00–4.00)

## 2024-09-24 NOTE — Telephone Encounter (Signed)
 Oral Oncology Patient Advocate Encounter  Christopher Wall fully approved and added into [facility].  Pharmacy notified.  Christopher Wall (Christopher Wall) Christopher Wall, CPhT  [facility], Christopher Wall, [facility] - Oral Chemotherapy Patient Advocate Specialist III Phone: [phone]  Fax: [phone]

## 2024-09-27 ENCOUNTER — Other Ambulatory Visit (HOSPITAL_COMMUNITY): Payer: Self-pay

## 2024-09-27 ENCOUNTER — Inpatient Hospital Stay

## 2024-09-27 ENCOUNTER — Other Ambulatory Visit: Payer: Self-pay | Admitting: Pharmacy Technician

## 2024-09-27 ENCOUNTER — Other Ambulatory Visit: Payer: Self-pay

## 2024-09-27 ENCOUNTER — Encounter: Payer: Self-pay | Admitting: Oncology

## 2024-09-27 ENCOUNTER — Inpatient Hospital Stay: Admitting: Oncology

## 2024-09-27 DIAGNOSIS — C61 Malignant neoplasm of prostate: Secondary | ICD-10-CM | POA: Diagnosis not present

## 2024-09-27 DIAGNOSIS — C7951 Secondary malignant neoplasm of bone: Secondary | ICD-10-CM | POA: Diagnosis not present

## 2024-09-27 DIAGNOSIS — Z79818 Long term (current) use of other agents affecting estrogen receptors and estrogen levels: Secondary | ICD-10-CM

## 2024-09-27 MED ORDER — PREDNISONE 5 MG PO TABS
5.0000 mg | ORAL_TABLET | Freq: Every day | ORAL | 5 refills | Status: AC
  Filled 2024-09-27: qty 30, 30d supply, fill #0

## 2024-09-27 MED ORDER — ABIRATERONE ACETATE 250 MG PO TABS
1000.0000 mg | ORAL_TABLET | Freq: Every day | ORAL | 2 refills | Status: AC
  Filled 2024-09-27 – 2024-10-08 (×2): qty 120, 30d supply, fill #0

## 2024-09-27 NOTE — Assessment & Plan Note (Signed)
 Eligard  45mg  Q6 months next due Sept  2025

## 2024-09-27 NOTE — Progress Notes (Signed)
 Oral Oncology Patient Advocate Encounter  Lorrene obtained and added into [facility].  Nashua (Patty) Chet Burnet, CPhT  [facility], Zelda Salmon, [facility] - Oral Chemotherapy Patient Advocate Specialist III Phone: [phone]  Fax: [phone]

## 2024-09-27 NOTE — Assessment & Plan Note (Signed)
 Stage IV metastatic prostate cancer.  NGS showed -no potentially actionable variants. He tolerates Abiraterone  1000mg  daily with prednisone  5mg  daily.  Continue current regimen.  PSA 0.02 Continue monitor PSA and plan repeat imaging if PSA rises.

## 2024-09-27 NOTE — Assessment & Plan Note (Signed)
 lower back pain has resolved.  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  Avoid heavy weight lifting.

## 2024-09-27 NOTE — Progress Notes (Signed)
 HEMATOLOGY-ONCOLOGY TeleHEALTH VISIT PROGRESS NOTE  I connected with Christopher Wall, Christopher Wall on 09/27/24  at 10:45 AM EST by video enabled telemedicine visit and verified that I am speaking with the correct person using two identifiers. I discussed the limitations, risks, security and privacy concerns of performing an evaluation and management service by telemedicine and the availability of in-person appointments. The patient expressed understanding and agreed to proceed.   Other persons participating in the visit and their role in the encounter:  None  Patient's location: Home  Provider's location: office Chief Complaint: prostate cancer   INTERVAL HISTORY Christopher Wall, Christopher Wall is a 79 y.o. male who has above history reviewed by me today presents for follow up visit for management of prostate cancer.   Patient reports feeling well.  She has chronic shortness of breath after exertion.  He does not exercise a lot.  Chronic fatigue.  Some life stressors. Patient follows up with cardiology and was recommended to have left heart catheterization  I attempted to connect the patient for visual enabled telehealth visit.  Due to the technical difficulties with video,  Patient was transitioned to audio only visit.   Review of Systems  Constitutional:  Positive for fatigue. Negative for appetite change, chills and fever.  HENT:   Negative for hearing loss and voice change.   Eyes:  Negative for eye problems.  Respiratory:  Negative for chest tightness and cough.        Shortness of breath with exertion  Cardiovascular:  Negative for chest pain.  Gastrointestinal:  Negative for abdominal distention, abdominal pain and blood in stool.  Endocrine: Negative for hot flashes.  Genitourinary:  Negative for difficulty urinating and frequency.   Musculoskeletal:  Negative for arthralgias.  Skin:  Negative for itching and rash.  Neurological:  Negative for extremity weakness.  Hematological:  Negative for  adenopathy.  Psychiatric/Behavioral:  Negative for confusion.     Past Medical History:  Diagnosis Date   Abdominal wall hernia 03/20/2018   ATTENTION DEFICIT, W/O HYPERACTIVITY 10/30/2006   Qualifier: History of  By: McDiarmid MD, Krystal     Cataract    bilateral cataracts with lens implant   Chest pain, atypical 12/05/2017   COLON POLYP 02/07/2014   Tubular Adenoma x 1 - Dr Teressa (GI)   Diverticulosis of colon 07/09/2018   Based on colonoscopy 06/2108   Encounter for neuropsychological testing 06/26/2015   [facility]: Only B/L avg in rote memory auditory info encoding. No mood dysfnc   Encounter for neuropsychological testing 2008   Michael Zelsen (Neuropsy): Mixed anxiety and depression o/w normal.    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  Repeat testing 06/2015 at [facility]: Only finding was below average in rote memory auditory info encoding. No mood dysfunction.    Obesity (BMI 30.0-34.9) 10/30/2006   Qualifier: History of   By: McDiarmid MD, Todd       Overweight (BMI 25.0-29.9) 10/30/2006   Qualifier: History of  By: McDiarmid MD,  Todd     Prediabetes 12/21/2013   Pulmonary nodule, left 06/09/2015   Pure hypercholesterolemia 10/30/2006   ACC calculation 10-yr risk of CV event of 9.5% with recommendation to consider Statin therapy of moderate-to-high potency (12/21/13)     Rotator cuff tear 12/08/2020   Shoulder injury, right, initial encounter  11/02/2020   Past Surgical History:  Procedure Laterality Date   CATARACT EXTRACTION W/ INTRAOCULAR LENS  IMPLANT, BILATERAL     COCCYGECTOMY  1985   VASECTOMY      Family History  Problem Relation Age of Onset   Hypertension Mother    Stroke Mother 25   Dementia Mother 4   Diabetes Father    Diabetes Sister    Diabetes Brother    Esophageal cancer Brother    Lymphoma Brother    Esophageal cancer Brother    Lymphoma Brother    Diabetes Brother    Colon cancer Neg Hx     Social History   Socioeconomic History   Marital status: Married    Spouse name: Devere   Number of children: 4   Years of education: >20   Highest education level: Not on file  Occupational History   Occupation: Education Officer, Community   Occupation: Teacher, Early Years/pre  Tobacco Use   Smoking status: Never   Smokeless tobacco: Never  Vaping Use   Vaping status: Never Used  Substance and Sexual Activity   Alcohol use: Yes    Alcohol/week: 3.0 standard drinks of alcohol    Types: 3 drink(s) per week   Drug use: No   Sexual activity: Yes    Comment: monogamous  Other Topics Concern   Not on file  Social History Narrative   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   4 adult sons ([place], [place] , [place] ): 3 biological and 1 step-child   Patient is third of six children   No formal HCPOA executed (12/21/13)   No Advanced Directive executed (12/21/13)   He and wife are raising a granddaughter (2016)   Hobbies: Motorcycling and Horse riding.         Social Drivers of Health   Tobacco Use: Low Risk (08/20/2024)   Patient History    Smoking Tobacco Use: Never    Smokeless Tobacco Use: Never    Passive Exposure: Not on file  Financial Resource Strain: Not on file  Food Insecurity: No Food Insecurity (03/21/2023)   Hunger Vital Sign    Worried About Running Out of Food in the Last Year: Never true    Ran Out of Food in the Last Year: Never true  Transportation Needs: No Transportation Needs (03/21/2023)    PRAPARE - Administrator, Civil Service (Medical): No    Lack of Transportation (Non-Medical): No  Physical Activity: Not on file  Stress: Not on file  Social Connections: Not on file  Intimate Partner Violence: Not At Risk (03/21/2023)   Humiliation, Afraid, Rape, and Kick questionnaire    Fear of Current or Ex-Partner: No    Emotionally Abused: No    Physically Abused: No    Sexually Abused: No  Depression (PHQ2-9): Low Risk (06/28/2024)   Depression (PHQ2-9)    PHQ-2 Score: 0  Alcohol Screen: Not on file  Housing: Low Risk (03/21/2023)   Housing    Last Housing Risk Score: 0  Utilities: Not At Risk (03/21/2023)   AHC Utilities    Threatened with loss of utilities: No  Health Literacy: Not on file  Medications Ordered Prior to Encounter[1]  Allergies[2]     Observations/Objective: There were no vitals filed for this visit. There is no height or weight on file to calculate BMI.  Physical Exam  CBC    Component Value Date/Time   WBC 6.5 09/24/2024 0900   WBC 7.6 02/18/2014 0953   RBC 4.74 09/24/2024 0900   HGB 13.1 09/24/2024 0900   HGB 15.2 11/29/2021 1108   HCT 39.4 09/24/2024 0900   HCT 44.4 11/29/2021 1108   PLT 183 09/24/2024 0900   PLT 222 11/29/2021 1108   MCV 83.1 09/24/2024 0900   MCV 80 11/29/2021 1108   MCH 27.6 09/24/2024 0900   MCHC 33.2 09/24/2024 0900   RDW 12.7 09/24/2024 0900   RDW 12.7 11/29/2021 1108   LYMPHSABS 2.8 09/24/2024 0900   LYMPHSABS 2.8 11/29/2021 1108   MONOABS 0.6 09/24/2024 0900   EOSABS 0.1 09/24/2024 0900   EOSABS 0.1 11/29/2021 1108   BASOSABS 0.0 09/24/2024 0900   BASOSABS 0.0 11/29/2021 1108    CMP     Component Value Date/Time   NA 140 09/24/2024 0900   NA 142 10/27/2023 0918   K 3.7 09/24/2024 0900   CL 105 09/24/2024 0900   CO2 25 09/24/2024 0900   GLUCOSE 110 (H) 09/24/2024 0900   BUN 25 (H) 09/24/2024 0900   BUN 26 10/27/2023 0918   CREATININE 1.00 09/24/2024 0900   CREATININE 0.99 06/08/2015  1229   CALCIUM  9.7 09/24/2024 0900   PROT 6.7 09/24/2024 0900   ALBUMIN 4.3 09/24/2024 0900   AST 25 09/24/2024 0900   ALT 24 09/24/2024 0900   ALKPHOS 48 09/24/2024 0900   BILITOT 0.4 09/24/2024 0900   GFRNONAA >60 09/24/2024 0900   GFRAA 75 (L) 02/16/2014 1934      ASSESSMENT & PLAN:   Prostate cancer (HCC) Stage IV metastatic prostate cancer.  NGS showed -no potentially actionable variants. He tolerates Abiraterone  1000mg  daily with prednisone  5mg  daily.  Continue current regimen.  PSA <0.02 Continue monitor PSA and plan repeat imaging if PSA rises.   Encounter for monitoring androgen deprivation therapy Eligard  45mg  Q6 months next due Sept  2025   Metastasis to bone [facility]) lower back pain has resolved.  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  Avoid heavy weight lifting.    No orders of the defined types were placed in this encounter.    I discussed the assessment and treatment plan with the patient. The patient was provided an opportunity to ask questions and all were answered. The patient agreed with the plan and demonstrated an understanding of the instructions.  The patient was advised to call back or seek an in-person evaluation if the symptoms worsen or if the condition fails to improve as anticipated.   I provided 15 minutes of non face-to-face telephone visit time during this encounter, and > 50% was spent counseling as documented under my assessment & plan.  Zelphia Cap, MD 09/27/2024 11:41 AM      [1]  Current Outpatient Medications on File Prior to Visit  Medication Sig Dispense Refill   aspirin  EC 81 MG tablet Take 1 tablet (81 mg total) by mouth daily. Swallow whole. 90 tablet 3   atorvastatin  (LIPITOR) 40 MG tablet Take 1 tablet (40 mg total) by mouth daily. 90 tablet 3   ibuprofen (ADVIL) 600 MG tablet Take 600 mg by mouth at bedtime.     lisinopril -hydrochlorothiazide  (ZESTORETIC ) 20-25 MG tablet Take 1 tablet  by mouth daily. 90 tablet 0   Vibegron  (GEMTESA ) 75 MG TABS Take 1 tablet (75 mg total) by mouth daily. 30 tablet 11   No current facility-administered medications on file prior to visit.  [2]  Allergies Allergen Reactions   Cephalosporins Rash

## 2024-09-28 ENCOUNTER — Other Ambulatory Visit: Payer: Self-pay

## 2024-09-28 ENCOUNTER — Other Ambulatory Visit (HOSPITAL_COMMUNITY): Payer: Self-pay

## 2024-09-28 NOTE — Addendum Note (Signed)
 Addended by: HEROLD ALMARIE PARAS on: 09/28/2024 10:10 AM   Modules accepted: Orders

## 2024-10-08 ENCOUNTER — Other Ambulatory Visit: Payer: Self-pay

## 2024-10-08 NOTE — Progress Notes (Unsigned)
 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    Patient was initially evaluated by Dr. Darliss 10/2023 in the setting of chest pain, not associated with exertion.  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  Coronary CTA 11/2023 revealed calcium  score of 3577 which was 94th percentile for age and sex matched control with severe proximal LAD stenosis and moderate proximal RCA stenosis.  CT FFR analysis showed significant stenosis in the mid LAD and proximal OM1 with cardiac catheterization recommended.  Patient was seen in follow-up with Dr. Darliss 12/2023 with ongoing occasional chest pressure.  Of note, he has metastatic prostate cancer and felt that chest discomfort could be secondary to bony metastasis.  Patient preferred to discuss cancer prognosis with his oncologist prior to proceeding with LHC.   Patient was most recently seen by Dr. Darliss 03/2024 reporting ongoing occasional pain.  He had not yet started taking aspirin .  LHC was again discussed but patient deferred.  He also declined antianginal therapy.  ***  Labs independently reviewed: 06/2024-Hgb 13.4, HCT 41.1, platelets 217, sodium 138, potassium 4.3, BUN 27, creatinine 1.24, normal LFTs 12/2022-TC 208, TG 172, HDL 67, LDL 111  Objective   Past Medical History:  Diagnosis Date   Abdominal wall hernia 03/20/2018   ATTENTION DEFICIT, W/O HYPERACTIVITY 10/30/2006   Qualifier: History of  By: McDiarmid MD, Todd     Cataract    bilateral cataracts with lens implant   Chest pain, atypical 12/05/2017    COLON POLYP 02/07/2014   Tubular Adenoma x 1 - Dr Teressa (GI)   Diverticulosis of colon 07/09/2018   Based on colonoscopy 06/2108   Encounter for neuropsychological testing 06/26/2015   [facility]: Only B/L avg in rote memory auditory info encoding. No mood dysfnc   Encounter for neuropsychological testing 2008   [facility] (Neuro[facility]): Mixed anxiety and depression o/w normal.    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  Repeat testing 06/2015 at [facility]: Only finding was below average in rote memory auditory info encoding. No mood dysfunction.    Obesity (BMI 30.0-34.9) 10/30/2006   Qualifier: History of   By: McDiarmid MD, Todd       Overweight (BMI 25.0-29.9) 10/30/2006   Qualifier: History of  By: McDiarmid MD, Todd     Prediabetes 12/21/2013   Pulmonary nodule, left 06/09/2015   Pure hypercholesterolemia 10/30/2006   ACC calculation 10-yr risk of CV event of 9.5% with recommendation to consider Statin therapy of moderate-to-high potency (12/21/13)  Rotator cuff tear 12/08/2020   Shoulder injury, right, initial encounter 11/02/2020    Current Medications: Active Medications[1]  Allergies:   Cephalosporins   Social History   Socioeconomic History   Marital status: Married    Spouse name: Devere   Number of children: 4   Years of education: >20   Highest education level: Not on file  Occupational History   Occupation: Education Officer, Community   Occupation:  Pharmacist  Tobacco Use   Smoking status: Never   Smokeless tobacco: Never  Vaping Use   Vaping status: Never Used  Substance and Sexual Activity   Alcohol use: Yes    Alcohol/week: 3.0 standard drinks of alcohol    Types: 3 drink(s) per week   Drug use: No   Sexual activity: Yes    Comment: monogamous  Other Topics Concern   Not on file  Social History Narrative   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   4 adult sons ([place], [place] , [place] ): 3 biological and 1 step-child   Patient is third of six children   No formal HCPOA executed (12/21/13)   No Advanced Directive executed (12/21/13)   He and wife are raising a granddaughter (2016)   Hobbies: Motorcycling and Horse riding.         Social Drivers of Health   Tobacco Use: Low Risk (09/27/2024)   Patient History    Smoking Tobacco Use: Never    Smokeless Tobacco Use: Never    Passive Exposure: Not on file  Financial Resource Strain: Not on file  Food Insecurity: No Food Insecurity (03/21/2023)   Hunger Vital Sign    Worried About Running Out of Food in the Last Year: Never true    Ran Out of Food in the Last Year: Never true  Transportation Needs: No Transportation Needs (03/21/2023)   PRAPARE - Administrator, Civil Service (Medical): No    Lack of Transportation (Non-Medical): No  Physical Activity: Not on file  Stress: Not on file  Social Connections: Not on file  Depression (PHQ2-9): Low Risk (06/28/2024)   Depression (PHQ2-9)    PHQ-2 Score: 0  Alcohol Screen: Not on file  Housing: Low Risk (03/21/2023)   Housing    Last Housing Risk Score: 0  Utilities: Not At Risk (03/21/2023)   AHC Utilities    Threatened with loss of utilities: No  Health Literacy: Not on file     Family History:  The patient's family history includes Dementia (age of onset: 46) in his mother; Diabetes in his brother, brother, father, and sister; Esophageal cancer in his brother and brother; Hypertension in his  mother; Lymphoma in his brother and brother; Stroke (age of onset: 23) in his mother. There is no history of Colon cancer.  ROS:   12-point review of systems is negative unless otherwise noted in the HPI.  EKGs/Other Studies Reviewed:    Studies reviewed were summarized above. The additional studies were reviewed today:  11/2023 Coronary CTA 1. Coronary calcium  score of 3577. This was 94th percentile for age and sex matched control. 2. Normal coronary origin with right dominance. 3. Severe proximal LAD stenosis (>70%). 4. Moderate proximal RCA stenosis (50-69%). 5. CAD-RADS 4 Severe stenosis. (70-99% or > 50% left main). Cardiac catheterization is recommended. Consider symptom-guided anti-ischemic pharmacotherapy as well as risk factor modification per guideline directed care 1. CT FFR analysis showed significant stenosis in the mid LAD and proximal OM1. 2.  Cardiac catheterization recommended  11/2023  2D echo 1. Left ventricular ejection fraction, by estimation, is 60 to 65%. The left ventricle has normal function. The left ventricle has no regional wall motion abnormalities. Left ventricular diastolic parameters are consistent with Grade I diastolic dysfunction (impaired relaxation).   2. Right ventricular systolic function is normal. The right ventricular size is normal.   3. The mitral valve is degenerative. No evidence of mitral valve regurgitation.   4. The aortic valve is tricuspid. Aortic valve regurgitation is mild. Aortic valve sclerosis/calcification is present, without any evidence of aortic stenosis. Aortic valve mean gradient measures 6.0 mmHg.   5. Aortic dilatation noted. There is mild dilatation of the ascending aorta, measuring 40 mm.   6. The inferior vena cava is normal in size with greater than 50% respiratory variability, suggesting right atrial pressure of 3 mmHg.   EKG:  EKG personally reviewed by me today    PHYSICAL EXAM:    VS:  There were no vitals taken for  this visit.  BMI: There is no height or weight on file to calculate BMI.  GEN: Well nourished, well developed in no acute distress NECK: No JVD; No carotid bruits CARDIAC: ***RRR, no murmurs, rubs, gallops RESPIRATORY:  Clear to auscultation without rales, wheezing or rhonchi  ABDOMEN: Soft, non-tender, non-distended EXTREMITIES:  *** No edema; No deformity  Wt Readings from Last 3 Encounters:  08/20/24 189 lb (85.7 kg)  06/28/24 191 lb 6.4 oz (86.8 kg)  03/29/24 186 lb 14.4 oz (84.8 kg)                  ASSESSMENT & PLAN:   Coronary artery disease   Hypertension   Hyperlipidemia   Ascending aorta dilation   {Are you ordering a CV Procedure (e.g. stress test, cath, DCCV, TEE, etc)?   Press F2        :[id number]}   Disposition: F/u with Dr. Darliss or an APP in ***.   Medication Adjustments/Labs and Tests Ordered: Current medicines are reviewed at length with the patient today.  Concerns regarding medicines are outlined above. Medication changes, Labs and Tests ordered today are summarized above and listed in the Patient Instructions accessible in Encounters.   Bonney Lesley Maffucci, PA-C 10/08/2024 1:16 PM     [facility] [place] [address] [place], [place] [address] [phone]      [1]  No outpatient medications have been marked as taking for the 10/11/24 encounter (Appointment) with Maffucci Lesley CROME, PA-C.   "

## 2024-10-11 ENCOUNTER — Ambulatory Visit: Admitting: Physician Assistant

## 2024-11-04 ENCOUNTER — Ambulatory Visit: Admitting: Family Medicine

## 2024-11-15 ENCOUNTER — Inpatient Hospital Stay

## 2024-11-15 ENCOUNTER — Inpatient Hospital Stay: Admitting: Oncology

## 2025-08-19 ENCOUNTER — Ambulatory Visit: Admitting: Urology
